# Patient Record
Sex: Male | Born: 1950 | Race: White | Hispanic: No | Marital: Married | Smoking: Former smoker
Health system: Southern US, Community
[De-identification: ages and names within clinical notes are randomized; demographics above are authoritative.]

## PROBLEM LIST (undated history)

## (undated) DIAGNOSIS — J449 Chronic obstructive pulmonary disease, unspecified: Secondary | ICD-10-CM

## (undated) DIAGNOSIS — H9190 Unspecified hearing loss, unspecified ear: Secondary | ICD-10-CM

## (undated) DIAGNOSIS — H269 Unspecified cataract: Secondary | ICD-10-CM

## (undated) DIAGNOSIS — I2510 Atherosclerotic heart disease of native coronary artery without angina pectoris: Secondary | ICD-10-CM

## (undated) DIAGNOSIS — Z7901 Long term (current) use of anticoagulants: Secondary | ICD-10-CM

## (undated) DIAGNOSIS — K219 Gastro-esophageal reflux disease without esophagitis: Secondary | ICD-10-CM

## (undated) DIAGNOSIS — A029 Salmonella infection, unspecified: Secondary | ICD-10-CM

## (undated) DIAGNOSIS — I484 Atypical atrial flutter: Secondary | ICD-10-CM

## (undated) DIAGNOSIS — K529 Noninfective gastroenteritis and colitis, unspecified: Secondary | ICD-10-CM

## (undated) DIAGNOSIS — H6690 Otitis media, unspecified, unspecified ear: Secondary | ICD-10-CM

## (undated) DIAGNOSIS — J329 Chronic sinusitis, unspecified: Secondary | ICD-10-CM

## (undated) DIAGNOSIS — N400 Enlarged prostate without lower urinary tract symptoms: Secondary | ICD-10-CM

## (undated) HISTORY — PX: EYE SURGERY: SHX253

## (undated) HISTORY — DX: Unspecified cataract: H26.9

## (undated) HISTORY — DX: Gastro-esophageal reflux disease without esophagitis: K21.9

## (undated) HISTORY — DX: Benign prostatic hyperplasia without lower urinary tract symptoms: N40.0

## (undated) HISTORY — PX: COLONOSCOPY: SHX174

---

## 2008-06-17 ENCOUNTER — Ambulatory Visit: Payer: Self-pay | Admitting: Diagnostic Radiology

## 2008-06-17 ENCOUNTER — Emergency Department (HOSPITAL_BASED_OUTPATIENT_CLINIC_OR_DEPARTMENT_OTHER): Admission: EM | Admit: 2008-06-17 | Discharge: 2008-06-17 | Payer: Self-pay | Admitting: Emergency Medicine

## 2008-09-17 ENCOUNTER — Ambulatory Visit: Payer: Self-pay | Admitting: Diagnostic Radiology

## 2008-09-17 ENCOUNTER — Emergency Department (HOSPITAL_BASED_OUTPATIENT_CLINIC_OR_DEPARTMENT_OTHER): Admission: EM | Admit: 2008-09-17 | Discharge: 2008-09-17 | Payer: Self-pay | Admitting: Emergency Medicine

## 2010-01-26 ENCOUNTER — Emergency Department (HOSPITAL_BASED_OUTPATIENT_CLINIC_OR_DEPARTMENT_OTHER): Admission: EM | Admit: 2010-01-26 | Discharge: 2010-01-26 | Payer: Self-pay | Admitting: Emergency Medicine

## 2010-05-06 ENCOUNTER — Emergency Department (HOSPITAL_BASED_OUTPATIENT_CLINIC_OR_DEPARTMENT_OTHER)
Admission: EM | Admit: 2010-05-06 | Discharge: 2010-05-06 | Payer: Self-pay | Source: Home / Self Care | Admitting: Emergency Medicine

## 2011-04-20 ENCOUNTER — Emergency Department (HOSPITAL_BASED_OUTPATIENT_CLINIC_OR_DEPARTMENT_OTHER)
Admission: EM | Admit: 2011-04-20 | Discharge: 2011-04-20 | Disposition: A | Discharge status: Home / Self Care | Payer: Self-pay | Attending: Emergency Medicine | Admitting: Emergency Medicine

## 2011-04-20 DIAGNOSIS — H9209 Otalgia, unspecified ear: Secondary | ICD-10-CM | POA: Insufficient documentation

## 2011-04-20 DIAGNOSIS — J329 Chronic sinusitis, unspecified: Secondary | ICD-10-CM | POA: Insufficient documentation

## 2011-04-20 DIAGNOSIS — R51 Headache: Secondary | ICD-10-CM | POA: Insufficient documentation

## 2011-04-20 MED ORDER — AMOXICILLIN 500 MG PO CAPS: 500.0000 mg | ORAL_CAPSULE | Freq: Three times a day (TID) | ORAL | Status: AC

## 2011-04-20 NOTE — ED Notes (Signed)
C/o sinus congestion right ear ache x 4 days

## 2011-04-20 NOTE — ED Provider Notes (Signed)
History     CSN: [id number]  Arrival date & time 04/20/11  1458   First MD Initiated Contact with Patient 04/20/11 1546      Chief Complaint  Patient presents with  . Facial Pain  . Otalgia    (Consider location/radiation/quality/duration/timing/severity/associated sxs/prior treatment) Patient is a 60 y.o. male presenting with ear pain. The history is provided by the patient. No language interpreter was used.  Otalgia This is a new problem. The current episode started more than 2 days ago. There is pain in the right ear. The problem occurs constantly. The problem has not changed since onset.There has been no fever. Associated symptoms include headaches.    History reviewed. No pertinent past medical history.  History reviewed. No pertinent past surgical history.  No family history on file.  History  Substance Use Topics  . Smoking status: Current Everyday Smoker  . Smokeless tobacco: Not on file  . Alcohol Use: No      Review of Systems  Constitutional: Negative.   HENT: Positive for ear pain.   Respiratory: Negative.   Cardiovascular: Negative.   Musculoskeletal: Negative.   Neurological: Positive for headaches.    Allergies  Aspirin  Home Medications   Current Outpatient Rx  Name Route Sig Dispense Refill  . ACETAMINOPHEN 500 MG PO TABS Oral Take 1,000 mg by mouth every 6 (six) hours as needed. For pain     . ADVAIR DISKUS IN Inhalation Inhale 1 puff into the lungs daily as needed. For shortness of breath     . PRESCRIPTION MEDICATION Right Ear Place 1 drop into the right ear once as needed. Previous prescription for ear infection     . PSEUDOEPH-DOXYLAMINE-DM-APAP 60-12.09-27-998 MG/30ML PO LIQD Oral Take 30 mLs by mouth at bedtime as needed. For congestion     . SINUS RELIEF PO Oral Take 2 capsules by mouth once as needed. For congestion     . AMOXICILLIN 500 MG PO CAPS Oral Take 1 capsule (500 mg total) by mouth 3 (three) times daily. 30 capsule 0     BP 133/91  Pulse 80  Temp(Src) 98.7 F (37.1 C) (Oral)  Resp 16  Ht 5\' 8"  (1.727 m)  Wt 180 lb (81.647 kg)  BMI 27.37 kg/m2  SpO2 100%  Physical Exam  Nursing note and vitals reviewed. Constitutional: He appears well-developed and well-nourished.  HENT:  Right Ear: External ear normal.  Left Ear: External ear normal.  Nose: Right sinus exhibits frontal sinus tenderness.  Mouth/Throat: Oropharynx is clear and moist.  Cardiovascular: Normal rate and regular rhythm.   Pulmonary/Chest: Effort normal and breath sounds normal.  Musculoskeletal: Normal range of motion.  Neurological: He is alert.    ED Course  Procedures (including critical care time)  Labs Reviewed - No data to display No results found.   1. Sinusitis       MDM  Will treat for sinusitis        Teressa Lower, NP 04/20/11 2049

## 2011-04-20 NOTE — ED Provider Notes (Signed)
Medical screening examination/treatment/procedure(s) were performed by non-physician practitioner and as supervising physician I was immediately available for consultation/collaboration.  Kinleigh Nault R. Cia Garretson, MD 04/20/11 2319 

## 2012-01-13 ENCOUNTER — Emergency Department (HOSPITAL_BASED_OUTPATIENT_CLINIC_OR_DEPARTMENT_OTHER): Payer: Self-pay

## 2012-01-13 ENCOUNTER — Emergency Department (HOSPITAL_BASED_OUTPATIENT_CLINIC_OR_DEPARTMENT_OTHER)
Admission: EM | Admit: 2012-01-13 | Discharge: 2012-01-13 | Disposition: A | Discharge status: Home / Self Care | Payer: Self-pay | Attending: Emergency Medicine | Admitting: Emergency Medicine

## 2012-01-13 ENCOUNTER — Encounter (HOSPITAL_BASED_OUTPATIENT_CLINIC_OR_DEPARTMENT_OTHER): Payer: Self-pay | Admitting: *Deleted

## 2012-01-13 DIAGNOSIS — K529 Noninfective gastroenteritis and colitis, unspecified: Secondary | ICD-10-CM

## 2012-01-13 DIAGNOSIS — K5289 Other specified noninfective gastroenteritis and colitis: Secondary | ICD-10-CM | POA: Insufficient documentation

## 2012-01-13 DIAGNOSIS — R509 Fever, unspecified: Secondary | ICD-10-CM | POA: Insufficient documentation

## 2012-01-13 DIAGNOSIS — J449 Chronic obstructive pulmonary disease, unspecified: Secondary | ICD-10-CM | POA: Insufficient documentation

## 2012-01-13 DIAGNOSIS — K625 Hemorrhage of anus and rectum: Secondary | ICD-10-CM | POA: Insufficient documentation

## 2012-01-13 DIAGNOSIS — R197 Diarrhea, unspecified: Secondary | ICD-10-CM | POA: Insufficient documentation

## 2012-01-13 DIAGNOSIS — K8020 Calculus of gallbladder without cholecystitis without obstruction: Secondary | ICD-10-CM | POA: Insufficient documentation

## 2012-01-13 DIAGNOSIS — J4489 Other specified chronic obstructive pulmonary disease: Secondary | ICD-10-CM | POA: Insufficient documentation

## 2012-01-13 DIAGNOSIS — N400 Enlarged prostate without lower urinary tract symptoms: Secondary | ICD-10-CM | POA: Insufficient documentation

## 2012-01-13 HISTORY — DX: Chronic obstructive pulmonary disease, unspecified: J44.9

## 2012-01-13 HISTORY — DX: Otitis media, unspecified, unspecified ear: H66.90

## 2012-01-13 HISTORY — DX: Chronic sinusitis, unspecified: J32.9

## 2012-01-13 LAB — COMPREHENSIVE METABOLIC PANEL
ALT: 14 U/L (ref 0–53)
Alkaline Phosphatase: 87 U/L (ref 39–117)
CO2: 24 mEq/L (ref 19–32)
GFR calc Af Amer: >90 mL/min (ref >90)
GFR calc non Af Amer: >90 mL/min (ref >90)
Glucose, Bld: 102 mg/dL — ABNORMAL HIGH (ref 70–99)
Potassium: 3.2 mEq/L — ABNORMAL LOW (ref 3.5–5.1)
Sodium: 133 mEq/L — ABNORMAL LOW (ref 135–145)

## 2012-01-13 LAB — URINALYSIS, ROUTINE W REFLEX MICROSCOPIC
Bilirubin Urine: NEGATIVE
Hgb urine dipstick: NEGATIVE
Protein, ur: NEGATIVE mg/dL
Urobilinogen, UA: 0.2 mg/dL (ref 0.0–1.0)

## 2012-01-13 LAB — CBC WITH DIFFERENTIAL/PLATELET
Eosinophils Relative: 2 % (ref 0–5)
Hemoglobin: 14.8 g/dL (ref 13.0–17.0)
Lymphocytes Relative: 13 % (ref 12–46)
Lymphs Abs: 1.4 10*3/uL (ref 0.7–4.0)
MCV: 84 fL (ref 78.0–100.0)
Neutrophils Relative %: 76 % (ref 43–77)
Platelets: 308 10*3/uL (ref 150–400)
RBC: 5 MIL/uL (ref 4.22–5.81)
WBC: 11.4 10*3/uL — ABNORMAL HIGH (ref 4.0–10.5)

## 2012-01-13 MED ORDER — PROMETHAZINE HCL 25 MG PO TABS: 25.0000 mg | ORAL_TABLET | Freq: Four times a day (QID) | ORAL | Status: DC | PRN

## 2012-01-13 MED ORDER — IOHEXOL 300 MG/ML  SOLN
20.0000 mL | INTRAMUSCULAR | Status: AC
  Administered 2012-01-13 (×2): 20 mL via ORAL

## 2012-01-13 MED ORDER — SODIUM CHLORIDE 0.9 % IV SOLN: INTRAVENOUS | Status: DC

## 2012-01-13 MED ORDER — SODIUM CHLORIDE 0.9 % IV BOLUS (SEPSIS)
1000.0000 mL | Freq: Once | INTRAVENOUS | Status: AC
  Administered 2012-01-13: 1000 mL via INTRAVENOUS

## 2012-01-13 MED ORDER — METRONIDAZOLE 500 MG PO TABS: 500.0000 mg | ORAL_TABLET | Freq: Two times a day (BID) | ORAL | Status: AC

## 2012-01-13 MED ORDER — CIPROFLOXACIN HCL 500 MG PO TABS: 500.0000 mg | ORAL_TABLET | Freq: Two times a day (BID) | ORAL | Status: AC

## 2012-01-13 MED ORDER — POTASSIUM CHLORIDE CRYS ER 20 MEQ PO TBCR
40.0000 meq | EXTENDED_RELEASE_TABLET | Freq: Once | ORAL | Status: AC
  Administered 2012-01-13: 40 meq via ORAL
  Filled 2012-01-13: qty 2

## 2012-01-13 MED ORDER — IOHEXOL 300 MG/ML  SOLN
100.0000 mL | Freq: Once | INTRAMUSCULAR | Status: AC | PRN
  Administered 2012-01-13: 100 mL via INTRAVENOUS

## 2012-01-13 MED ORDER — HYDROCODONE-ACETAMINOPHEN 5-325 MG PO TABS: 2.0000 | ORAL_TABLET | ORAL | Status: AC | PRN

## 2012-01-13 MED ORDER — ONDANSETRON HCL 4 MG/2ML IJ SOLN
4.0000 mg | Freq: Once | INTRAMUSCULAR | Status: AC
  Administered 2012-01-13: 4 mg via INTRAVENOUS
  Filled 2012-01-13: qty 2

## 2012-01-13 NOTE — ED Notes (Addendum)
Pt states he has noticed a little bright red blood in his diarrhea stools x 2 days. Fever last p.m. Vomited x 1 earlier. Also states he recently had a tooth abscess and used baking soda, peroxide and listerine for same.

## 2012-01-13 NOTE — ED Provider Notes (Signed)
History     CSN: [id number]  Arrival date & time 01/13/12  1337   First MD Initiated Contact with Patient 01/13/12 1424      Chief Complaint  Patient presents with  . Rectal Bleeding    (Consider location/radiation/quality/duration/timing/severity/associated sxs/prior treatment) Patient is a 61 y.o. male presenting with abdominal pain. The history is provided by the patient. No language interpreter was used.  Abdominal Pain The primary symptoms of the illness include abdominal pain. The current episode started 2 days ago. The onset of the illness was gradual. The problem has not changed since onset. The illness is associated with eating. The patient states that she believes she is currently not pregnant. Risk factors: none. Symptoms associated with the illness do not include chills. Significant associated medical issues do not include inflammatory bowel disease.    Past Medical History  Diagnosis Date  . COPD (chronic obstructive pulmonary disease)   . Sinus infection   . Ear infection     History reviewed. No pertinent past surgical history.  History reviewed. No pertinent family history.  History  Substance Use Topics  . Smoking status: Current Every Day Smoker  . Smokeless tobacco: Not on file  . Alcohol Use: Yes      Review of Systems  Constitutional: Negative for chills.  Gastrointestinal: Positive for abdominal pain.  All other systems reviewed and are negative.    Allergies  Aspirin  Home Medications   Current Outpatient Rx  Name Route Sig Dispense Refill  . ACETAMINOPHEN 500 MG PO TABS Oral Take 1,000 mg by mouth every 6 (six) hours as needed. For pain    . CASANTHRANOL-DOCUSATE SODIUM 30-100 MG PO CAPS Oral Take 2 capsules by mouth daily as needed. For constipation.    Marland Kitchen ADVAIR DISKUS IN Inhalation Inhale 1 puff into the lungs daily as needed. For shortness of breath      BP 147/76  Pulse 92  Temp 99.3 F (37.4 C) (Oral)  Resp 20  Ht 5\' 3"   (1.6 m)  Wt 145 lb (65.772 kg)  BMI 25.69 kg/m2  SpO2 98%  Physical Exam  Vitals reviewed. Constitutional: He appears well-developed and well-nourished.  HENT:  Head: Normocephalic and atraumatic.  Right Ear: External ear normal.  Left Ear: External ear normal.  Nose: Nose normal.  Mouth/Throat: Oropharynx is clear and moist.  Eyes: Conjunctivae normal and EOM are normal. Pupils are equal, round, and reactive to light.  Neck: Normal range of motion.  Cardiovascular: Normal rate and normal heart sounds.   Pulmonary/Chest: Effort normal and breath sounds normal.  Abdominal: Soft.  Musculoskeletal: Normal range of motion.  Neurological: He is alert.  Skin: Skin is warm.  Psychiatric: He has a normal mood and affect.    ED Course  Procedures (including critical care time)  Labs Reviewed  CBC WITH DIFFERENTIAL - Abnormal; Notable for the following:    WBC 11.4 (*)     Neutro Abs 8.6 (*)     Monocytes Absolute 1.1 (*)     All other components within normal limits  COMPREHENSIVE METABOLIC PANEL - Abnormal; Notable for the following:    Sodium 133 (*)     Potassium 3.2 (*)     Glucose, Bld 102 (*)     All other components within normal limits  URINALYSIS, ROUTINE W REFLEX MICROSCOPIC - Abnormal; Notable for the following:    Color, Urine AMBER (*)  BIOCHEMICALS MAY BE AFFECTED BY COLOR   All other components within normal  limits  LIPASE, BLOOD  OCCULT BLOOD X 1 CARD TO LAB, STOOL   Dg Chest 2 View  01/13/2012  *RADIOLOGY REPORT*  Clinical Data: Bloody diarrhea.  Fever.  Vomiting.  CHEST - 2 VIEW  Comparison: 09/17/2008  Findings: Pleuroparenchymal scarring at the lung apices is stable. Normal heart size.  Bronchitic changes.  Hyperaeration and increased AP diameter of the chest.  Stable thoracic spine.  No pleural effusion.  No pneumothorax.  IMPRESSION: No active cardiopulmonary disease.  Chronic changes are noted.   Original Report Authenticated By: Donavan Burnet, M.D.     Ct Abdomen Pelvis W Contrast  01/13/2012  *RADIOLOGY REPORT*  Clinical Data: Rectal bleeding  CT ABDOMEN AND PELVIS WITH CONTRAST  Technique:  Multidetector CT imaging of the abdomen and pelvis was performed following the standard protocol during bolus administration of intravenous contrast.  Contrast: OMNIPAQUE IOHEXOL 300 MG/ML  SOLN  Comparison: None.  Findings: There is scarring at the left lung base.  No active process.  No pleural or pericardial fluid.  There is an 9 mm cyst in the lateral segment of the left lobe of the liver.  The remainder of the liver parenchyma is normal.  There are several calcified gallstones but no evidence of cholecystitis at this moment.  The spleen is normal.  The pancreas is normal. The adrenal glands are normal.  No renal parenchymal lesions other than a 9 mm cyst at the upper pole of the left kidney and two 9 mm cysts projecting laterally from the mid portion of the right kidney.  Bilaterally prominent renal pelves, probably normal variant.  There is atherosclerosis of the aorta but no aneurysm. The IVC is unremarkable.  No retroperitoneal mass or adenopathy. No free intraperitoneal fluid or air.  There is prostatic enlargement. The bladder is thick-walled and there are multiple bladder diverticulae, the largest projecting posteriorly on the left measuring over 6 cm in diameter.  The small bowel appears unremarkable.  The colon appears to contain a large amount of fluid.  There could be colonic wall thickening in the right colon raising the possibility of colitis.  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  IMPRESSION: Probable colonic wall thickening in the right colon suggesting colitis.  Large amount of fluid throughout the colon.  No small bowel pathology evident.  Gallstones without evidence of cholecystitis or obstruction at this moment.  Atherosclerosis of the aorta.  Degenerative disease of the lumbar spine.  Enlarged prostate.   Multiple bladder diverticulae.   Original Report Authenticated By: Thomasenia Sales, M.D.      1. Diarrhea   2. Rectal bleeding       MDM  . Results for orders placed during the hospital encounter of 01/13/12  CBC WITH DIFFERENTIAL      Component Value Range   WBC 11.4 (*) 4.0 - 10.5 K/uL   RBC 5.00  4.22 - 5.81 MIL/uL   Hemoglobin 14.8  13.0 - 17.0 g/dL   HCT 84.1  32.4 - 40.1 %   MCV 84.0  78.0 - 100.0 fL   MCH 29.6  26.0 - 34.0 pg   MCHC 35.2  30.0 - 36.0 g/dL   RDW 02.7  25.3 - 66.4 %   Platelets 308  150 - 400 K/uL   Neutrophils Relative 76  43 - 77 %   Neutro Abs 8.6 (*) 1.7 - 7.7 K/uL   Lymphocytes Relative 13  12 - 46 %   Lymphs Abs  1.4  0.7 - 4.0 K/uL   Monocytes Relative 9  3 - 12 %   Monocytes Absolute 1.1 (*) 0.1 - 1.0 K/uL   Eosinophils Relative 2  0 - 5 %   Eosinophils Absolute 0.3  0.0 - 0.7 K/uL   Basophils Relative 0  0 - 1 %   Basophils Absolute 0.0  0.0 - 0.1 K/uL  COMPREHENSIVE METABOLIC PANEL      Component Value Range   Sodium 133 (*) 135 - 145 mEq/L   Potassium 3.2 (*) 3.5 - 5.1 mEq/L   Chloride 97  96 - 112 mEq/L   CO2 24  19 - 32 mEq/L   Glucose, Bld 102 (*) 70 - 99 mg/dL   BUN 12  6 - 23 mg/dL   Creatinine, Ser 1.61  0.50 - 1.35 mg/dL   Calcium 9.0  8.4 - 09.6 mg/dL   Total Protein 7.4  6.0 - 8.3 g/dL   Albumin 3.7  3.5 - 5.2 g/dL   AST 15  0 - 37 U/L   ALT 14  0 - 53 U/L   Alkaline Phosphatase 87  39 - 117 U/L   Total Bilirubin 0.4  0.3 - 1.2 mg/dL   GFR calc non Af Amer >90  >90 mL/min   GFR calc Af Amer >90  >90 mL/min  LIPASE, BLOOD      Component Value Range   Lipase 33  11 - 59 U/L  URINALYSIS, ROUTINE W REFLEX MICROSCOPIC      Component Value Range   Color, Urine AMBER (*) YELLOW   APPearance CLEAR  CLEAR   Specific Gravity, Urine 1.023  1.005 - 1.030   pH 5.5  5.0 - 8.0   Glucose, UA NEGATIVE  NEGATIVE mg/dL   Hgb urine dipstick NEGATIVE  NEGATIVE   Bilirubin Urine NEGATIVE  NEGATIVE   Ketones, ur NEGATIVE  NEGATIVE mg/dL    Protein, ur NEGATIVE  NEGATIVE mg/dL   Urobilinogen, UA 0.2  0.0 - 1.0 mg/dL   Nitrite NEGATIVE  NEGATIVE   Leukocytes, UA NEGATIVE  NEGATIVE  OCCULT BLOOD X 1 CARD TO LAB, STOOL      Component Value Range   Fecal Occult Bld NEGATIVE         Ct scan shows colitis,   Pt advised of need for follow up.   Pt given number for primary care and advised of need for follow up.    Lonia Skinner [place], [place] 01/14/12 1212  Lonia Skinner [place], [place] 01/14/12 1213

## 2012-01-13 NOTE — ED Provider Notes (Addendum)
History     CSN: [id number]  Arrival date & time 01/13/12  1337   First MD Initiated Contact with Patient 01/13/12 1424      Chief Complaint  Patient presents with  . Rectal Bleeding    (Consider location/radiation/quality/duration/timing/severity/associated sxs/prior treatment) Patient is a 61 y.o. male presenting with hematochezia. The history is provided by the patient.  Rectal Bleeding  The current episode started yesterday. The problem has been resolved. The pain is mild. Associated symptoms include a fever and diarrhea. Pertinent negatives include no abdominal pain, no hematemesis, no nausea, no rectal pain, no vomiting, no hematuria, no chest pain, no headaches, no coughing, no difficulty breathing and no rash.   Onset of a diarrheal-type illness yesterday had one bowel movement last evening that had the red blood in it that was pink in the tall water. No bleeding since. Diarrhea has persisted though. No vomiting no significant abdominal pain does feel weak all over. No history of similar type illness. Past Medical History  Diagnosis Date  . COPD (chronic obstructive pulmonary disease)   . Sinus infection   . Ear infection     History reviewed. No pertinent past surgical history.  History reviewed. No pertinent family history.  History  Substance Use Topics  . Smoking status: Current Every Day Smoker  . Smokeless tobacco: Not on file  . Alcohol Use: Yes      Review of Systems  Constitutional: Positive for fever and fatigue.  HENT: Negative for neck pain.   Eyes: Negative for visual disturbance.  Respiratory: Negative for cough.   Cardiovascular: Negative for chest pain.  Gastrointestinal: Positive for diarrhea and hematochezia. Negative for nausea, vomiting, abdominal pain, rectal pain and hematemesis.  Genitourinary: Negative for dysuria and hematuria.  Musculoskeletal: Negative for back pain.  Skin: Negative for rash.  Neurological: Negative for headaches.    Hematological: Does not bruise/bleed easily.    Allergies  Aspirin  Home Medications   Current Outpatient Rx  Name Route Sig Dispense Refill  . ACETAMINOPHEN 500 MG PO TABS Oral Take 1,000 mg by mouth every 6 (six) hours as needed. For pain    . CASANTHRANOL-DOCUSATE SODIUM 30-100 MG PO CAPS Oral Take 2 capsules by mouth daily as needed. For constipation.    Marland Kitchen ADVAIR DISKUS IN Inhalation Inhale 1 puff into the lungs daily as needed. For shortness of breath      BP 147/76  Pulse 92  Temp 99.3 F (37.4 C) (Oral)  Resp 20  Ht 5\' 3"  (1.6 m)  Wt 145 lb (65.772 kg)  BMI 25.69 kg/m2  SpO2 98%  Physical Exam  Nursing note and vitals reviewed. Constitutional: He is oriented to person, place, and time. He appears well-developed and well-nourished. No distress.  HENT:  Head: Normocephalic and atraumatic.  Mouth/Throat: Oropharynx is clear and moist.  Eyes: Conjunctivae normal and EOM are normal. Pupils are equal, round, and reactive to light.  Neck: Normal range of motion. Neck supple.  Cardiovascular: Normal rate, regular rhythm and normal heart sounds.   No murmur heard. Pulmonary/Chest: Effort normal and breath sounds normal. He has no wheezes. He has no rales.  Abdominal: Soft. Bowel sounds are normal. There is no tenderness.  Musculoskeletal: Normal range of motion. He exhibits no edema.  Neurological: He is alert and oriented to person, place, and time. No cranial nerve deficit. He exhibits normal muscle tone. Coordination normal.  Skin: Skin is warm. No rash noted.    ED Course  Procedures (including  critical care time)  Labs Reviewed  CBC WITH DIFFERENTIAL - Abnormal; Notable for the following:    WBC 11.4 (*)     Neutro Abs 8.6 (*)     Monocytes Absolute 1.1 (*)     All other components within normal limits  COMPREHENSIVE METABOLIC PANEL - Abnormal; Notable for the following:    Sodium 133 (*)     Potassium 3.2 (*)     Glucose, Bld 102 (*)     All other  components within normal limits  LIPASE, BLOOD  OCCULT BLOOD X 1 CARD TO LAB, STOOL  URINALYSIS, ROUTINE W REFLEX MICROSCOPIC   Dg Chest 2 View  01/13/2012  *RADIOLOGY REPORT*  Clinical Data: Bloody diarrhea.  Fever.  Vomiting.  CHEST - 2 VIEW  Comparison: 09/17/2008  Findings: Pleuroparenchymal scarring at the lung apices is stable. Normal heart size.  Bronchitic changes.  Hyperaeration and increased AP diameter of the chest.  Stable thoracic spine.  No pleural effusion.  No pneumothorax.  IMPRESSION: No active cardiopulmonary disease.  Chronic changes are noted.   Original Report Authenticated By: Donavan Burnet, M.D.    Results for orders placed during the hospital encounter of 01/13/12  CBC WITH DIFFERENTIAL      Component Value Range   WBC 11.4 (*) 4.0 - 10.5 K/uL   RBC 5.00  4.22 - 5.81 MIL/uL   Hemoglobin 14.8  13.0 - 17.0 g/dL   HCT 16.1  09.6 - 04.5 %   MCV 84.0  78.0 - 100.0 fL   MCH 29.6  26.0 - 34.0 pg   MCHC 35.2  30.0 - 36.0 g/dL   RDW 40.9  81.1 - 91.4 %   Platelets 308  150 - 400 K/uL   Neutrophils Relative 76  43 - 77 %   Neutro Abs 8.6 (*) 1.7 - 7.7 K/uL   Lymphocytes Relative 13  12 - 46 %   Lymphs Abs 1.4  0.7 - 4.0 K/uL   Monocytes Relative 9  3 - 12 %   Monocytes Absolute 1.1 (*) 0.1 - 1.0 K/uL   Eosinophils Relative 2  0 - 5 %   Eosinophils Absolute 0.3  0.0 - 0.7 K/uL   Basophils Relative 0  0 - 1 %   Basophils Absolute 0.0  0.0 - 0.1 K/uL  COMPREHENSIVE METABOLIC PANEL      Component Value Range   Sodium 133 (*) 135 - 145 mEq/L   Potassium 3.2 (*) 3.5 - 5.1 mEq/L   Chloride 97  96 - 112 mEq/L   CO2 24  19 - 32 mEq/L   Glucose, Bld 102 (*) 70 - 99 mg/dL   BUN 12  6 - 23 mg/dL   Creatinine, Ser 7.82  0.50 - 1.35 mg/dL   Calcium 9.0  8.4 - 95.6 mg/dL   Total Protein 7.4  6.0 - 8.3 g/dL   Albumin 3.7  3.5 - 5.2 g/dL   AST 15  0 - 37 U/L   ALT 14  0 - 53 U/L   Alkaline Phosphatase 87  39 - 117 U/L   Total Bilirubin 0.4  0.3 - 1.2 mg/dL   GFR calc non  Af Amer >90  >90 mL/min   GFR calc Af Amer >90  >90 mL/min  LIPASE, BLOOD      Component Value Range   Lipase 33  11 - 59 U/L  OCCULT BLOOD X 1 CARD TO LAB, STOOL      Component Value  Range   Fecal Occult Bld NEGATIVE       1. Diarrhea   2. Rectal bleeding       MDM  Patient with acute onset of a diarrheal type illness last evening did have one bowel movement with blood and has not had any since. Labs are still pending but appears to be no leukocytosis. Vital signs are normal no significant tachycardia or hypotension. The results of the CT will be significant in determining patient's disposition.  CBC shows no leukocytosis no significant anemia.        Shelda Jakes, MD 01/13/12 1501  Addendum:  Results for orders placed during the hospital encounter of 01/13/12  CBC WITH DIFFERENTIAL      Component Value Range   WBC 11.4 (*) 4.0 - 10.5 K/uL   RBC 5.00  4.22 - 5.81 MIL/uL   Hemoglobin 14.8  13.0 - 17.0 g/dL   HCT 30.8  65.7 - 84.6 %   MCV 84.0  78.0 - 100.0 fL   MCH 29.6  26.0 - 34.0 pg   MCHC 35.2  30.0 - 36.0 g/dL   RDW 96.2  95.2 - 84.1 %   Platelets 308  150 - 400 K/uL   Neutrophils Relative 76  43 - 77 %   Neutro Abs 8.6 (*) 1.7 - 7.7 K/uL   Lymphocytes Relative 13  12 - 46 %   Lymphs Abs 1.4  0.7 - 4.0 K/uL   Monocytes Relative 9  3 - 12 %   Monocytes Absolute 1.1 (*) 0.1 - 1.0 K/uL   Eosinophils Relative 2  0 - 5 %   Eosinophils Absolute 0.3  0.0 - 0.7 K/uL   Basophils Relative 0  0 - 1 %   Basophils Absolute 0.0  0.0 - 0.1 K/uL  COMPREHENSIVE METABOLIC PANEL      Component Value Range   Sodium 133 (*) 135 - 145 mEq/L   Potassium 3.2 (*) 3.5 - 5.1 mEq/L   Chloride 97  96 - 112 mEq/L   CO2 24  19 - 32 mEq/L   Glucose, Bld 102 (*) 70 - 99 mg/dL   BUN 12  6 - 23 mg/dL   Creatinine, Ser 3.24  0.50 - 1.35 mg/dL   Calcium 9.0  8.4 - 40.1 mg/dL   Total Protein 7.4  6.0 - 8.3 g/dL   Albumin 3.7  3.5 - 5.2 g/dL   AST 15  0 - 37 U/L   ALT 14  0 -  53 U/L   Alkaline Phosphatase 87  39 - 117 U/L   Total Bilirubin 0.4  0.3 - 1.2 mg/dL   GFR calc non Af Amer >90  >90 mL/min   GFR calc Af Amer >90  >90 mL/min  LIPASE, BLOOD      Component Value Range   Lipase 33  11 - 59 U/L  URINALYSIS, ROUTINE W REFLEX MICROSCOPIC      Component Value Range   Color, Urine AMBER (*) YELLOW   APPearance CLEAR  CLEAR   Specific Gravity, Urine 1.023  1.005 - 1.030   pH 5.5  5.0 - 8.0   Glucose, UA NEGATIVE  NEGATIVE mg/dL   Hgb urine dipstick NEGATIVE  NEGATIVE   Bilirubin Urine NEGATIVE  NEGATIVE   Ketones, ur NEGATIVE  NEGATIVE mg/dL   Protein, ur NEGATIVE  NEGATIVE mg/dL   Urobilinogen, UA 0.2  0.0 - 1.0 mg/dL   Nitrite NEGATIVE  NEGATIVE   Leukocytes, UA NEGATIVE  NEGATIVE  OCCULT BLOOD X 1 CARD TO LAB, STOOL      Component Value Range   Fecal Occult Bld NEGATIVE     Dg Chest 2 View  01/13/2012  *RADIOLOGY REPORT*  Clinical Data: Bloody diarrhea.  Fever.  Vomiting.  CHEST - 2 VIEW  Comparison: 09/17/2008  Findings: Pleuroparenchymal scarring at the lung apices is stable. Normal heart size.  Bronchitic changes.  Hyperaeration and increased AP diameter of the chest.  Stable thoracic spine.  No pleural effusion.  No pneumothorax.  IMPRESSION: No active cardiopulmonary disease.  Chronic changes are noted.   Original Report Authenticated By: Donavan Burnet, M.D.    Ct Abdomen Pelvis W Contrast  01/13/2012  *RADIOLOGY REPORT*  Clinical Data: Rectal bleeding  CT ABDOMEN AND PELVIS WITH CONTRAST  Technique:  Multidetector CT imaging of the abdomen and pelvis was performed following the standard protocol during bolus administration of intravenous contrast.  Contrast: OMNIPAQUE IOHEXOL 300 MG/ML  SOLN  Comparison: None.  Findings: There is scarring at the left lung base.  No active process.  No pleural or pericardial fluid.  There is an 9 mm cyst in the lateral segment of the left lobe of the liver.  The remainder of the liver parenchyma is normal.   There are several calcified gallstones but no evidence of cholecystitis at this moment.  The spleen is normal.  The pancreas is normal. The adrenal glands are normal.  No renal parenchymal lesions other than a 9 mm cyst at the upper pole of the left kidney and two 9 mm cysts projecting laterally from the mid portion of the right kidney.  Bilaterally prominent renal pelves, probably normal variant.  There is atherosclerosis of the aorta but no aneurysm. The IVC is unremarkable.  No retroperitoneal mass or adenopathy. No free intraperitoneal fluid or air.  There is prostatic enlargement. The bladder is thick-walled and there are multiple bladder diverticulae, the largest projecting posteriorly on the left measuring over 6 cm in diameter.  The small bowel appears unremarkable.  The colon appears to contain a large amount of fluid.  There could be colonic wall thickening in the right colon raising the possibility of colitis.  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  IMPRESSION: Probable colonic wall thickening in the right colon suggesting colitis.  Large amount of fluid throughout the colon.  No small bowel pathology evident.  Gallstones without evidence of cholecystitis or obstruction at this moment.  Atherosclerosis of the aorta.  Degenerative disease of the lumbar spine.  Enlarged prostate.  Multiple bladder diverticulae.   Original Report Authenticated By: Thomasenia Sales, M.D.      CT cw colitis as suspected.     Shelda Jakes, MD 01/13/12 [phone]

## 2012-01-15 NOTE — ED Provider Notes (Signed)
Medical screening examination/treatment/procedure(s) were conducted as a shared visit with non-physician practitioner(s) and myself.  I personally evaluated the patient during the encounter  Shelda Jakes, MD 01/15/12 [phone]

## 2012-02-01 ENCOUNTER — Emergency Department (HOSPITAL_BASED_OUTPATIENT_CLINIC_OR_DEPARTMENT_OTHER)
Admission: EM | Admit: 2012-02-01 | Discharge: 2012-02-01 | Disposition: A | Discharge status: Home / Self Care | Payer: Self-pay | Attending: Emergency Medicine | Admitting: Emergency Medicine

## 2012-02-01 ENCOUNTER — Encounter (HOSPITAL_BASED_OUTPATIENT_CLINIC_OR_DEPARTMENT_OTHER): Payer: Self-pay | Admitting: *Deleted

## 2012-02-01 DIAGNOSIS — K649 Unspecified hemorrhoids: Secondary | ICD-10-CM | POA: Insufficient documentation

## 2012-02-01 DIAGNOSIS — F17200 Nicotine dependence, unspecified, uncomplicated: Secondary | ICD-10-CM | POA: Insufficient documentation

## 2012-02-01 DIAGNOSIS — Z888 Allergy status to other drugs, medicaments and biological substances status: Secondary | ICD-10-CM | POA: Insufficient documentation

## 2012-02-01 DIAGNOSIS — K625 Hemorrhage of anus and rectum: Secondary | ICD-10-CM | POA: Insufficient documentation

## 2012-02-01 HISTORY — DX: Noninfective gastroenteritis and colitis, unspecified: K52.9

## 2012-02-01 LAB — COMPREHENSIVE METABOLIC PANEL
ALT: 15 U/L (ref 0–53)
CO2: 24 mEq/L (ref 19–32)
Calcium: 9.3 mg/dL (ref 8.4–10.5)
Creatinine, Ser: 0.7 mg/dL (ref 0.50–1.35)
GFR calc Af Amer: >90 mL/min (ref >90)
GFR calc non Af Amer: >90 mL/min (ref >90)
Glucose, Bld: 96 mg/dL (ref 70–99)
Sodium: 136 mEq/L (ref 135–145)

## 2012-02-01 LAB — CBC WITH DIFFERENTIAL/PLATELET
Basophils Absolute: 0 10*3/uL (ref 0.0–0.1)
Basophils Relative: 0 % (ref 0–1)
Eosinophils Absolute: 0.2 10*3/uL (ref 0.0–0.7)
Eosinophils Relative: 3 % (ref 0–5)
HCT: 41.2 % (ref 39.0–52.0)
MCH: 29.4 pg (ref 26.0–34.0)
MCHC: 34.5 g/dL (ref 30.0–36.0)
Monocytes Absolute: 0.8 10*3/uL (ref 0.1–1.0)
Neutro Abs: 5.5 10*3/uL (ref 1.7–7.7)
RDW: 13.3 % (ref 11.5–15.5)

## 2012-02-01 LAB — PROTIME-INR: INR: 1.07 (ref 0.00–1.49)

## 2012-02-01 MED ORDER — SODIUM CHLORIDE 0.9 % IV SOLN
1000.0000 mL | INTRAVENOUS | Status: DC
  Administered 2012-02-01: 1000 mL via INTRAVENOUS

## 2012-02-01 MED ORDER — DOCUSATE SODIUM 100 MG PO CAPS: 100.0000 mg | ORAL_CAPSULE | Freq: Two times a day (BID) | ORAL | Status: DC

## 2012-02-01 MED ORDER — PRAMOXINE HCL 1 % RE FOAM: RECTAL | Status: DC | PRN

## 2012-02-01 NOTE — ED Provider Notes (Signed)
History     CSN: [id number]  Arrival date & time 02/01/12  1314   First MD Initiated Contact with Patient 02/01/12 1326      Chief Complaint  Patient presents with  . Rectal Bleeding    (Consider location/radiation/quality/duration/timing/severity/associated sxs/prior treatment) HPI Comments: Pt was seen in the ED on 9/14.  He was diagnosed with colitis.  At that time he was having rectal bleeding.  Pt had been getting better but he noticed it start up a few days ago.  He was referred to GI but has not made an appointment yet.  Patient is a 61 y.o. male presenting with hematochezia. The history is provided by the patient.  Rectal Bleeding  The current episode started 3 to 5 days ago. The problem occurs frequently. The pain is mild. The stool is described as soft and streaked with blood. Pertinent negatives include no anorexia, no fever, no abdominal pain, no nausea and no vomiting.  He has noticed that he started to get constipated again.  Hi  Past Medical History  Diagnosis Date  . COPD (chronic obstructive pulmonary disease)   . Sinus infection   . Ear infection   . Colitis     History reviewed. No pertinent past surgical history.  History reviewed. No pertinent family history.  History  Substance Use Topics  . Smoking status: Current Every Day Smoker -- 1.0 packs/day  . Smokeless tobacco: Not on file  . Alcohol Use: Yes      Review of Systems  Constitutional: Negative for fever.  Gastrointestinal: Positive for hematochezia. Negative for nausea, vomiting, abdominal pain and anorexia.  All other systems reviewed and are negative.    Allergies  Aspirin  Home Medications   Current Outpatient Rx  Name Route Sig Dispense Refill  . ACETAMINOPHEN 500 MG PO TABS Oral Take 1,000 mg by mouth every 6 (six) hours as needed. For pain    . CASANTHRANOL-DOCUSATE SODIUM 30-100 MG PO CAPS Oral Take 2 capsules by mouth daily as needed. For constipation.    Marland Kitchen ADVAIR DISKUS  IN Inhalation Inhale 1 puff into the lungs daily as needed. For shortness of breath    . PROMETHAZINE HCL 25 MG PO TABS Oral Take 1 tablet (25 mg total) by mouth every 6 (six) hours as needed for nausea. 20 tablet 0    BP 158/87  Pulse 91  Temp 98.1 F (36.7 C)  Resp 18  Ht 5\' 7"  (1.702 m)  Wt 165 lb (74.844 kg)  BMI 25.84 kg/m2  SpO2 100%  Physical Exam  Nursing note and vitals reviewed. Constitutional: He appears well-developed and well-nourished. No distress.  HENT:  Head: Normocephalic and atraumatic.  Right Ear: External ear normal.  Left Ear: External ear normal.  Eyes: Conjunctivae normal are normal. Right eye exhibits no discharge. Left eye exhibits no discharge. No scleral icterus.  Neck: Neck supple. No tracheal deviation present.  Cardiovascular: Normal rate, regular rhythm and intact distal pulses.   Pulmonary/Chest: Effort normal and breath sounds normal. No stridor. No respiratory distress. He has no wheezes. He has no rales.  Abdominal: Soft. Bowel sounds are normal. He exhibits no distension. There is no tenderness. There is no rebound and no guarding.  Genitourinary: Rectal exam shows internal hemorrhoid. Rectal exam shows no fissure, no mass, no tenderness and anal tone normal. Guaiac positive stool.       Small amount of blood streaking noted on glove during exam, no melena   Musculoskeletal: He exhibits no  edema and no tenderness.  Neurological: He is alert. He has normal strength. No sensory deficit. Cranial nerve deficit:  no gross defecits noted. He exhibits normal muscle tone. He displays no seizure activity. Coordination normal.  Skin: Skin is warm and dry. No rash noted.  Psychiatric: He has a normal mood and affect.    ED Course  Procedures (including critical care time) CT scan 9/14 IMPRESSION:  Probable colonic wall thickening in the right colon suggesting  colitis. Large amount of fluid throughout the colon. No small  bowel pathology evident.    Gallstones without evidence of cholecystitis or obstruction at this  moment.  Atherosclerosis of the aorta.  Degenerative disease of the lumbar spine.  Enlarged prostate. Multiple bladder diverticulae.  Original Report Authenticated By: Thomasenia Sales, M.D.    Labs Reviewed  COMPREHENSIVE METABOLIC PANEL  APTT  PROTIME-INR  CBC WITH DIFFERENTIAL  OCCULT BLOOD X 1 CARD TO LAB, STOOL   No results found.   1. Rectal bleeding   2. Hemorrhoids       MDM  Pt with reassuring evaluation.  No active bleeding noted on exam although guaic positive. No abdominal ttp, doubt diverticulitis.  External hemorrhoid noted and suspect internal by exam.  Doubt life threatening GI bleed at this time. Discussed importance of follow up with GI for colonoscopy/sigmoidoscopy.  Pt understands to return to the ED for recurrent/worsening symptoms.        Celene Kras, MD 02/07/12 [phone]

## 2012-02-01 NOTE — ED Notes (Signed)
Pt seen here 9/14 for same, rectal bleeding, which improved but started back x 3 days ago . Did not f/u with gi

## 2012-02-01 NOTE — ED Notes (Signed)
Discharge instructions reviewed. Pt verbalized understanding.  

## 2012-02-09 ENCOUNTER — Telehealth (HOSPITAL_BASED_OUTPATIENT_CLINIC_OR_DEPARTMENT_OTHER): Payer: Self-pay | Admitting: *Deleted

## 2012-02-09 ENCOUNTER — Emergency Department (HOSPITAL_BASED_OUTPATIENT_CLINIC_OR_DEPARTMENT_OTHER)
Admission: EM | Admit: 2012-02-09 | Discharge: 2012-02-09 | Disposition: A | Discharge status: Home / Self Care | Payer: Self-pay | Attending: Emergency Medicine | Admitting: Emergency Medicine

## 2012-02-09 ENCOUNTER — Encounter (HOSPITAL_BASED_OUTPATIENT_CLINIC_OR_DEPARTMENT_OTHER): Payer: Self-pay | Admitting: *Deleted

## 2012-02-09 DIAGNOSIS — J4489 Other specified chronic obstructive pulmonary disease: Secondary | ICD-10-CM | POA: Insufficient documentation

## 2012-02-09 DIAGNOSIS — E876 Hypokalemia: Secondary | ICD-10-CM | POA: Insufficient documentation

## 2012-02-09 DIAGNOSIS — N390 Urinary tract infection, site not specified: Secondary | ICD-10-CM | POA: Insufficient documentation

## 2012-02-09 DIAGNOSIS — R109 Unspecified abdominal pain: Secondary | ICD-10-CM

## 2012-02-09 DIAGNOSIS — R1033 Periumbilical pain: Secondary | ICD-10-CM | POA: Insufficient documentation

## 2012-02-09 DIAGNOSIS — J449 Chronic obstructive pulmonary disease, unspecified: Secondary | ICD-10-CM | POA: Insufficient documentation

## 2012-02-09 LAB — COMPREHENSIVE METABOLIC PANEL
Alkaline Phosphatase: 74 U/L (ref 39–117)
BUN: 12 mg/dL (ref 6–23)
Chloride: 101 mEq/L (ref 96–112)
GFR calc Af Amer: >90 mL/min (ref >90)
Glucose, Bld: 98 mg/dL (ref 70–99)
Potassium: 3.1 mEq/L — ABNORMAL LOW (ref 3.5–5.1)
Total Bilirubin: 0.2 mg/dL — ABNORMAL LOW (ref 0.3–1.2)

## 2012-02-09 LAB — CBC WITH DIFFERENTIAL/PLATELET
Hemoglobin: 13 g/dL (ref 13.0–17.0)
Lymphocytes Relative: 14 % (ref 12–46)
Lymphs Abs: 1.8 10*3/uL (ref 0.7–4.0)
Monocytes Relative: 12 % (ref 3–12)
Neutro Abs: 9.3 10*3/uL — ABNORMAL HIGH (ref 1.7–7.7)
Neutrophils Relative %: 71 % (ref 43–77)
RBC: 4.39 MIL/uL (ref 4.22–5.81)

## 2012-02-09 LAB — URINE MICROSCOPIC-ADD ON

## 2012-02-09 LAB — URINALYSIS, ROUTINE W REFLEX MICROSCOPIC
Glucose, UA: NEGATIVE mg/dL
Ketones, ur: NEGATIVE mg/dL
Protein, ur: NEGATIVE mg/dL

## 2012-02-09 MED ORDER — CIPROFLOXACIN HCL 250 MG PO TABS: 250.0000 mg | ORAL_TABLET | Freq: Two times a day (BID) | ORAL | Status: DC

## 2012-02-09 MED ORDER — SODIUM CHLORIDE 0.9 % IV BOLUS (SEPSIS)
1000.0000 mL | Freq: Once | INTRAVENOUS | Status: AC
  Administered 2012-02-09: 1000 mL via INTRAVENOUS

## 2012-02-09 MED ORDER — METRONIDAZOLE 500 MG PO TABS: 500.0000 mg | ORAL_TABLET | Freq: Three times a day (TID) | ORAL | Status: DC

## 2012-02-09 MED ORDER — HYDROMORPHONE HCL PF 1 MG/ML IJ SOLN
1.0000 mg | Freq: Once | INTRAMUSCULAR | Status: AC
  Administered 2012-02-09: 1 mg via INTRAVENOUS
  Filled 2012-02-09: qty 1

## 2012-02-09 MED ORDER — POTASSIUM CHLORIDE CRYS ER 20 MEQ PO TBCR
40.0000 meq | EXTENDED_RELEASE_TABLET | Freq: Once | ORAL | Status: AC
  Administered 2012-02-09: 40 meq via ORAL
  Filled 2012-02-09: qty 2

## 2012-02-09 MED ORDER — OXYCODONE-ACETAMINOPHEN 5-325 MG PO TABS: 2.0000 | ORAL_TABLET | ORAL | Status: DC | PRN

## 2012-02-09 NOTE — ED Notes (Signed)
Physician Assistant at bedside.

## 2012-02-09 NOTE — ED Provider Notes (Signed)
History     CSN: [id number]  Arrival date & time 02/09/12  1321   First MD Initiated Contact with Patient 02/09/12 1338      Chief Complaint  Patient presents with  . Diarrhea    (Consider location/radiation/quality/duration/timing/severity/associated sxs/prior treatment) HPI Comments: Patient with a history of colitis presents with abdominal pain for the past 3 weeks. The pain started gradually and feels "crampy." The pain is located periumbilically and radiates "all over" his abdomen. He reports associated constipation and diarrhea, general malaise, weakness, as well as blood streaked stools and blood on the tissue when wiping. He denies alleviating/aggravating factors. He has tried prune juice for the constipation which makes him have diarrhea. He has a follow up with a GI specialist this month. He denies fever, nausea, vomiting, SOB, chest pain, dysuria, hematuria.  Patient is a 61 y.o. male presenting with diarrhea.  Diarrhea The primary symptoms include abdominal pain and diarrhea.  The illness is also significant for constipation.    Past Medical History  Diagnosis Date  . COPD (chronic obstructive pulmonary disease)   . Sinus infection   . Ear infection   . Colitis     History reviewed. No pertinent past surgical history.  No family history on file.  History  Substance Use Topics  . Smoking status: Current Every Day Smoker -- 1.0 packs/day  . Smokeless tobacco: Not on file  . Alcohol Use: Yes      Review of Systems  Gastrointestinal: Positive for abdominal pain, diarrhea, constipation, blood in stool and rectal pain.  All other systems reviewed and are negative.    Allergies  Aspirin  Home Medications   Current Outpatient Rx  Name Route Sig Dispense Refill  . ACETAMINOPHEN 500 MG PO TABS Oral Take 1,000 mg by mouth every 6 (six) hours as needed. For pain    . CASANTHRANOL-DOCUSATE SODIUM 30-100 MG PO CAPS Oral Take 2 capsules by mouth daily as  needed. For constipation.    Marland Kitchen DOCUSATE SODIUM 100 MG PO CAPS Oral Take 1 capsule (100 mg total) by mouth every 12 (twelve) hours. 60 capsule 0  . ADVAIR DISKUS IN Inhalation Inhale 1 puff into the lungs daily as needed. For shortness of breath    . PRAMOXINE HCL 1 % RE FOAM Rectal Place rectally every 2 (two) hours as needed for hemorrhoids. 15 g 0  . PROMETHAZINE HCL 25 MG PO TABS Oral Take 1 tablet (25 mg total) by mouth every 6 (six) hours as needed for nausea. 20 tablet 0    BP 131/78  Pulse 85  Temp 97.8 F (36.6 C) (Oral)  Resp 20  SpO2 99%  Physical Exam  Nursing note and vitals reviewed. Constitutional: He is oriented to person, place, and time. He appears well-developed and well-nourished. No distress.  HENT:  Head: Normocephalic and atraumatic.  Eyes: Conjunctivae normal are normal. No scleral icterus.  Neck: Normal range of motion. Neck supple.  Cardiovascular: Normal rate and regular rhythm.  Exam reveals no gallop and no friction rub.   No murmur heard. Pulmonary/Chest: Effort normal and breath sounds normal. He has no wheezes. He has no rales. He exhibits no tenderness.  Abdominal: Soft. There is tenderness.       Generalized tenderness to palpation.   Genitourinary: Rectum normal.       No blood or stool noted on rectal exam. Patient endorsed pain during rectal exam. No internal hemorrhoids felt.   Musculoskeletal: Normal range of motion.  Neurological: He  is alert and oriented to person, place, and time. Coordination normal.       Speech is goal-oriented. Moves limbs without ataxia.   Skin: Skin is warm and dry. He is not diaphoretic.  Psychiatric: He has a normal mood and affect. His behavior is normal.    ED Course  Procedures (including critical care time)  Labs Reviewed  URINALYSIS, ROUTINE W REFLEX MICROSCOPIC - Abnormal; Notable for the following:    Color, Urine AMBER (*)  BIOCHEMICALS MAY BE AFFECTED BY COLOR   APPearance CLOUDY (*)     Hgb urine  dipstick MODERATE (*)     Bilirubin Urine SMALL (*)     Leukocytes, UA TRACE (*)     All other components within normal limits  CBC WITH DIFFERENTIAL - Abnormal; Notable for the following:    WBC 13.0 (*)     HCT 36.8 (*)     Neutro Abs 9.3 (*)     Monocytes Absolute 1.5 (*)     All other components within normal limits  COMPREHENSIVE METABOLIC PANEL - Abnormal; Notable for the following:    Potassium 3.1 (*)     Calcium 8.0 (*)     Total Protein 5.7 (*)     Albumin 2.8 (*)     Total Bilirubin 0.2 (*)     All other components within normal limits  URINE MICROSCOPIC-ADD ON - Abnormal; Notable for the following:    Bacteria, UA MANY (*)     All other components within normal limits  STOOL CULTURE  CLOSTRIDIUM DIFFICILE BY PCR   No results found.   1. Abdominal pain   2. UTI (urinary tract infection)   3. Hypokalemia       MDM  2:03 PM Patient with a history of colitis presents with abdominal pain. Labs pending. No blood or stool noted on rectal exam. Will give fluids and pain meds.   4:19 PM Patient feeling better after fluids, pain medication, and potassium. Patient given potassium due to hypokalemia noted on CMP. Patient's urinalysis shows UTI. I will treat him for UTI and discharge him with pain medication for abdominal pain. Patient will see GI specialist in 5 days, as he has an appointment scheduled. No further evaluation needed at this time.      Emilia Beck, PA-C 02/09/12 2212

## 2012-02-09 NOTE — ED Notes (Signed)
Urinal provided.

## 2012-02-09 NOTE — ED Notes (Signed)
Lab results reviewed with Dr. Fredderick Phenix and Mckinley Jewel. PAC.  Received prescription for metronidazole 500mg  po , 1 tab tid dispense 42 tabs, no refills.   Call placed to patient to discuss diagnosis and prescription called to [facility] [address] [place], [place].  Instructions reviewed with patient who verbalized understanding.

## 2012-02-09 NOTE — ED Notes (Signed)
Diarrhea x 3 weeks. Was diagnosed with colitis here in the past.

## 2012-02-09 NOTE — ED Notes (Signed)
Report received from Terri Masten, RN, care assumed. 

## 2012-02-10 NOTE — ED Provider Notes (Signed)
Medical screening examination/treatment/procedure(s) were performed by non-physician practitioner and as supervising physician I was immediately available for consultation/collaboration.   Lorenzo Pereyra B. Raetta Agostinelli, MD 02/10/12 0843 

## 2012-02-15 NOTE — ED Notes (Signed)
Patient informed of positive results  

## 2012-02-18 ENCOUNTER — Emergency Department (HOSPITAL_BASED_OUTPATIENT_CLINIC_OR_DEPARTMENT_OTHER)
Admission: EM | Admit: 2012-02-18 | Discharge: 2012-02-18 | Discharge status: Against Medical Advice | Payer: Self-pay | Attending: Emergency Medicine | Admitting: Emergency Medicine

## 2012-02-18 ENCOUNTER — Encounter (HOSPITAL_BASED_OUTPATIENT_CLINIC_OR_DEPARTMENT_OTHER): Payer: Self-pay | Admitting: *Deleted

## 2012-02-18 DIAGNOSIS — D649 Anemia, unspecified: Secondary | ICD-10-CM | POA: Insufficient documentation

## 2012-02-18 DIAGNOSIS — R109 Unspecified abdominal pain: Secondary | ICD-10-CM | POA: Insufficient documentation

## 2012-02-18 DIAGNOSIS — R300 Dysuria: Secondary | ICD-10-CM | POA: Insufficient documentation

## 2012-02-18 HISTORY — DX: Salmonella infection, unspecified: A02.9

## 2012-02-18 LAB — CBC WITH DIFFERENTIAL/PLATELET
Hemoglobin: 8.8 g/dL — ABNORMAL LOW (ref 13.0–17.0)
Lymphocytes Relative: 19 % (ref 12–46)
Lymphs Abs: 2 10*3/uL (ref 0.7–4.0)
MCH: 29.3 pg (ref 26.0–34.0)
Monocytes Relative: 6 % (ref 3–12)
Neutro Abs: 7.6 10*3/uL (ref 1.7–7.7)
Neutrophils Relative %: 73 % (ref 43–77)
RBC: 3 MIL/uL — ABNORMAL LOW (ref 4.22–5.81)
WBC: 10.5 10*3/uL (ref 4.0–10.5)

## 2012-02-18 LAB — URINALYSIS, ROUTINE W REFLEX MICROSCOPIC
Hgb urine dipstick: NEGATIVE
Leukocytes, UA: NEGATIVE
Specific Gravity, Urine: 1.009 (ref 1.005–1.030)
Urobilinogen, UA: 0.2 mg/dL (ref 0.0–1.0)

## 2012-02-18 LAB — BASIC METABOLIC PANEL
GFR calc Af Amer: >90 mL/min (ref >90)
GFR calc non Af Amer: >90 mL/min (ref >90)
Glucose, Bld: 102 mg/dL — ABNORMAL HIGH (ref 70–99)
Potassium: 4.1 mEq/L (ref 3.5–5.1)
Sodium: 137 mEq/L (ref 135–145)

## 2012-02-18 MED ORDER — HYDROMORPHONE HCL PF 1 MG/ML IJ SOLN
0.5000 mg | Freq: Once | INTRAMUSCULAR | Status: AC
  Administered 2012-02-18: 0.5 mg via INTRAVENOUS
  Filled 2012-02-18: qty 1

## 2012-02-18 MED ORDER — SODIUM CHLORIDE 0.9 % IV SOLN
Freq: Once | INTRAVENOUS | Status: AC
  Administered 2012-02-18: 15:00:00 via INTRAVENOUS

## 2012-02-18 NOTE — ED Provider Notes (Addendum)
History     CSN: [id number]  Arrival date & time 02/18/12  1349   First MD Initiated Contact with Patient 02/18/12 1404      Chief Complaint  Patient presents with  . Groin Pain    (Consider location/radiation/quality/duration/timing/severity/associated sxs/prior treatment) Patient is a 61 y.o. male presenting with groin pain and dysuria. The history is provided by the patient.  Groin Pain This is a new problem. The current episode started in the past 7 days. The problem has been gradually worsening. Associated symptoms include abdominal pain. Pertinent negatives include no chest pain, chills, coughing, fever, nausea or vomiting. Associated symptoms comments: He complains of LLQ abdominal pain for 4 days, worsening over time. No N, V. He had diarrhea up til last week after being diagnosed with C. Diff that improved with antibiotics. No fever. He also complains of persistent hemorrhoids that only occasionally bleed a small amount. .  Dysuria  This is a new problem. The current episode started 3 to 5 hours ago. The problem occurs every urination. The problem has been gradually worsening. The patient is experiencing no pain. There has been no fever. Pertinent negatives include no chills, no nausea, no vomiting and no hematuria. Associated symptoms comments: He reports difficulty starting a stream of urine but then feels he empties his bladder. No hematuria..    Past Medical History  Diagnosis Date  . COPD (chronic obstructive pulmonary disease)   . Sinus infection   . Ear infection   . Colitis   . Salmonella     History reviewed. No pertinent past surgical history.  History reviewed. No pertinent family history.  History  Substance Use Topics  . Smoking status: Current Every Day Smoker -- 1.0 packs/day  . Smokeless tobacco: Not on file  . Alcohol Use: Yes      Review of Systems  Constitutional: Negative for fever and chills.  HENT: Negative.   Respiratory: Negative.   Negative for cough and shortness of breath.   Cardiovascular: Negative.  Negative for chest pain.  Gastrointestinal: Positive for abdominal pain. Negative for nausea, vomiting and diarrhea.  Genitourinary: Positive for difficulty urinating. Negative for hematuria.  Musculoskeletal: Negative.   Skin: Negative.   Neurological: Negative.  Negative for dizziness.  Psychiatric/Behavioral: Negative for confusion.    Allergies  Aspirin  Home Medications   Current Outpatient Rx  Name Route Sig Dispense Refill  . ACETAMINOPHEN 500 MG PO TABS Oral Take 1,000 mg by mouth every 6 (six) hours as needed. For pain    . CASANTHRANOL-DOCUSATE SODIUM 30-100 MG PO CAPS Oral Take 2 capsules by mouth daily as needed. For constipation.    Marland Kitchen CIPROFLOXACIN HCL 250 MG PO TABS Oral Take 1 tablet (250 mg total) by mouth 2 (two) times daily. 6 tablet 0  . DOCUSATE SODIUM 100 MG PO CAPS Oral Take 1 capsule (100 mg total) by mouth every 12 (twelve) hours. 60 capsule 0  . ADVAIR DISKUS IN Inhalation Inhale 1 puff into the lungs daily as needed. For shortness of breath    . METRONIDAZOLE 500 MG PO TABS Oral Take 1 tablet (500 mg total) by mouth 3 (three) times daily. 42 tablet 0  . OXYCODONE-ACETAMINOPHEN 5-325 MG PO TABS Oral Take 2 tablets by mouth every 4 (four) hours as needed for pain. 15 tablet 0  . PRAMOXINE HCL 1 % RE FOAM Rectal Place rectally every 2 (two) hours as needed for hemorrhoids. 15 g 0  . PROMETHAZINE HCL 25 MG PO TABS  Oral Take 1 tablet (25 mg total) by mouth every 6 (six) hours as needed for nausea. 20 tablet 0    BP 129/74  Pulse 77  Temp 98.3 F (36.8 C) (Oral)  Resp 19  Ht 5\' 8"  (1.727 m)  Wt 170 lb (77.111 kg)  BMI 25.85 kg/m2  SpO2 100%  Physical Exam  Constitutional: He is oriented to person, place, and time. He appears well-developed and well-nourished.  HENT:  Head: Normocephalic.  Neck: Normal range of motion. Neck supple.  Cardiovascular: Normal rate and regular rhythm.     Pulmonary/Chest: Effort normal and breath sounds normal. He has no wheezes. He has no rales.  Abdominal: Soft. Bowel sounds are normal. There is no rebound and no guarding.       Mild lower quadrant tenderness without rebound, guarding or mass. Tenderness is greater on left than right.  Genitourinary:       No testicular swelling, mass or tenderness.   Musculoskeletal: Normal range of motion. He exhibits no edema.  Neurological: He is alert and oriented to person, place, and time.  Skin: Skin is warm and dry. No rash noted.  Psychiatric: He has a normal mood and affect.    ED Course  Procedures (including critical care time)  Labs Reviewed  BASIC METABOLIC PANEL - Abnormal; Notable for the following:    Glucose, Bld 102 (*)     BUN 5 (*)     All other components within normal limits  CBC WITH DIFFERENTIAL - Abnormal; Notable for the following:    RBC 3.00 (*)     Hemoglobin 8.8 (*)     HCT 26.4 (*)     Platelets 540 (*)     All other components within normal limits  URINALYSIS, ROUTINE W REFLEX MICROSCOPIC   Results for orders placed during the hospital encounter of 02/18/12  URINALYSIS, ROUTINE W REFLEX MICROSCOPIC      Component Value Range   Color, Urine YELLOW  YELLOW   APPearance CLEAR  CLEAR   Specific Gravity, Urine 1.009  1.005 - 1.030   pH 7.0  5.0 - 8.0   Glucose, UA NEGATIVE  NEGATIVE mg/dL   Hgb urine dipstick NEGATIVE  NEGATIVE   Bilirubin Urine NEGATIVE  NEGATIVE   Ketones, ur NEGATIVE  NEGATIVE mg/dL   Protein, ur NEGATIVE  NEGATIVE mg/dL   Urobilinogen, UA 0.2  0.0 - 1.0 mg/dL   Nitrite NEGATIVE  NEGATIVE   Leukocytes, UA NEGATIVE  NEGATIVE  BASIC METABOLIC PANEL      Component Value Range   Sodium 137  135 - 145 mEq/L   Potassium 4.1  3.5 - 5.1 mEq/L   Chloride 102  96 - 112 mEq/L   CO2 25  19 - 32 mEq/L   Glucose, Bld 102 (*) 70 - 99 mg/dL   BUN 5 (*) 6 - 23 mg/dL   Creatinine, Ser 4.54  0.50 - 1.35 mg/dL   Calcium 9.1  8.4 - 09.8 mg/dL   GFR  calc non Af Amer >90  >90 mL/min   GFR calc Af Amer >90  >90 mL/min  CBC WITH DIFFERENTIAL      Component Value Range   WBC 10.5  4.0 - 10.5 K/uL   RBC 3.00 (*) 4.22 - 5.81 MIL/uL   Hemoglobin 8.8 (*) 13.0 - 17.0 g/dL   HCT 11.9 (*) 14.7 - 82.9 %   MCV 88.0  78.0 - 100.0 fL   MCH 29.3  26.0 - 34.0 pg  MCHC 33.3  30.0 - 36.0 g/dL   RDW 45.4  09.8 - 11.9 %   Platelets 540 (*) 150 - 400 K/uL   Neutrophils Relative 73  43 - 77 %   Neutro Abs 7.6  1.7 - 7.7 K/uL   Lymphocytes Relative 19  12 - 46 %   Lymphs Abs 2.0  0.7 - 4.0 K/uL   Monocytes Relative 6  3 - 12 %   Monocytes Absolute 0.6  0.1 - 1.0 K/uL   Eosinophils Relative 2  0 - 5 %   Eosinophils Absolute 0.2  0.0 - 0.7 K/uL   Basophils Relative 1  0 - 1 %   Basophils Absolute 0.1  0.0 - 0.1 K/uL    No results found.   No diagnosis found.  1. Anemia 2. Abdominal pain   MDM  The patient feels improved with pain medication. Review of lab results and comparable recent labs show significant drop in hemoglobin from 13 one week ago to 8 today. Findings were discussed with the patient who states he does not want to be admitted. We discussed in detail the significance of the change in hemoglobin level and implications on his health and that he could decline quickly at home and it become a life threatening condition. He has talked with his family and remains adamant that he will not be admitted today but go for follow up tomorrow at [facility]. He was also informed that admission to [facility] (his choice) could be arranged from here but he still refuses.         Rodena Medin, PA-C 02/18/12 1741  Rodena Medin, PA-C 03/14/12 1035

## 2012-02-18 NOTE — ED Notes (Signed)
Pt c/o groin pain, hematuria and dysuria x 4 days

## 2012-02-18 NOTE — ED Notes (Signed)
Patient stated that he wanted to go home and will come back to hospital if he feels worse tomorrow. States that his abd pain had subsided. Explained to patient that he could be admitted to [facility] since his MD was that, but he declined. Family expressed their desire to him for continuation of care, but patient stated that his desire was to go home.

## 2012-02-19 NOTE — ED Provider Notes (Signed)
Medical screening examination/treatment/procedure(s) were performed by non-physician practitioner and as supervising physician I was immediately available for consultation/collaboration.  Glynn Octave, MD 02/19/12 [phone]

## 2012-03-01 LAB — STOOL CULTURE

## 2012-03-14 NOTE — ED Provider Notes (Signed)
Medical screening examination/treatment/procedure(s) were performed by non-physician practitioner and as supervising physician I was immediately available for consultation/collaboration.  Glynn Octave, MD 03/14/12 1236

## 2014-01-09 DIAGNOSIS — N401 Enlarged prostate with lower urinary tract symptoms: Secondary | ICD-10-CM | POA: Diagnosis not present

## 2014-01-09 DIAGNOSIS — R350 Frequency of micturition: Secondary | ICD-10-CM | POA: Diagnosis not present

## 2014-01-09 DIAGNOSIS — N323 Diverticulum of bladder: Secondary | ICD-10-CM | POA: Diagnosis not present

## 2014-01-22 ENCOUNTER — Ambulatory Visit (HOSPITAL_BASED_OUTPATIENT_CLINIC_OR_DEPARTMENT_OTHER)
Admission: RE | Admit: 2014-01-22 | Discharge: 2014-01-22 | Disposition: A | Discharge status: Home / Self Care | Payer: Medicare Other | Source: Ambulatory Visit | Attending: Medical | Admitting: Medical

## 2014-01-22 ENCOUNTER — Ambulatory Visit (INDEPENDENT_AMBULATORY_CARE_PROVIDER_SITE_OTHER): Payer: Medicare Other | Admitting: Medical

## 2014-01-22 ENCOUNTER — Encounter: Payer: Self-pay | Admitting: Medical

## 2014-01-22 VITALS — BP 135/84 | HR 79 | Temp 98.1°F | Ht 67.5 in | Wt 188.4 lb

## 2014-01-22 DIAGNOSIS — F17200 Nicotine dependence, unspecified, uncomplicated: Secondary | ICD-10-CM | POA: Diagnosis not present

## 2014-01-22 DIAGNOSIS — R05 Cough: Secondary | ICD-10-CM

## 2014-01-22 DIAGNOSIS — Z23 Encounter for immunization: Secondary | ICD-10-CM

## 2014-01-22 DIAGNOSIS — K219 Gastro-esophageal reflux disease without esophagitis: Secondary | ICD-10-CM

## 2014-01-22 DIAGNOSIS — R059 Cough, unspecified: Secondary | ICD-10-CM

## 2014-01-22 DIAGNOSIS — J441 Chronic obstructive pulmonary disease with (acute) exacerbation: Secondary | ICD-10-CM

## 2014-01-22 DIAGNOSIS — J984 Other disorders of lung: Secondary | ICD-10-CM | POA: Diagnosis not present

## 2014-01-22 DIAGNOSIS — L989 Disorder of the skin and subcutaneous tissue, unspecified: Secondary | ICD-10-CM

## 2014-01-22 DIAGNOSIS — J449 Chronic obstructive pulmonary disease, unspecified: Secondary | ICD-10-CM | POA: Diagnosis not present

## 2014-01-22 DIAGNOSIS — N400 Enlarged prostate without lower urinary tract symptoms: Secondary | ICD-10-CM

## 2014-01-22 DIAGNOSIS — J4489 Other specified chronic obstructive pulmonary disease: Secondary | ICD-10-CM | POA: Insufficient documentation

## 2014-01-22 HISTORY — DX: Disorder of the skin and subcutaneous tissue, unspecified: L98.9

## 2014-01-22 HISTORY — DX: Chronic obstructive pulmonary disease with (acute) exacerbation: J44.1

## 2014-01-22 HISTORY — DX: Cough, unspecified: R05.9

## 2014-01-22 MED ORDER — ALBUTEROL SULFATE HFA 108 (90 BASE) MCG/ACT IN AERS: 2.0000 | INHALATION_SPRAY | Freq: Four times a day (QID) | RESPIRATORY_TRACT | Status: DC | PRN

## 2014-01-22 NOTE — Assessment & Plan Note (Signed)
Related to copd. New pt and will get xray. Does not appear acute or infectious presently.

## 2014-01-22 NOTE — Assessment & Plan Note (Signed)
Gerd diet guideline advised. Use zantac otc. If becomes daily consider use of ppi. Only rare occasional now. Not every day gerd.

## 2014-01-22 NOTE — Patient Instructions (Addendum)
Good to meet you.  For your various medical problem.  For your gerd. Eat healthy and if occasional reflux then use zantac otc. If this becomes daily issue then need eval and possible different type med.  For you copd. Would you call and give me dose of the advair you used as discus form then will send rx of that in. Sent albuterol inhaler to pharmacy today. Baseline cxr today also.  Continue to follow up with urologist. Referring to dermatologist to evaluate your skin lesions.  Schedule wellness exam for one month. Please schedule 9:30-10 am. And come in fasting.

## 2014-01-22 NOTE — Assessment & Plan Note (Signed)
Continue meds rx by urologist. Pt considering surgery.

## 2014-01-22 NOTE — Assessment & Plan Note (Signed)
Refer to dermatologist 

## 2014-01-22 NOTE — Assessment & Plan Note (Signed)
Pt advised to call and give me dose of his advair inhaler. Will try to rx discus since he prefers this. Also will send him albuterol to pharmacy. Getting cxr today baseline due to 50 yr smoking hx.

## 2014-01-22 NOTE — Progress Notes (Signed)
Subjective:    Patient ID: JW COVIN, male    DOB: [birth year], 63 y.o.   MRN: [id number]  HPI  Pt is new pt. Reviewed pmh, psh, and fh.   Cataracts hx of. October 20th he is getting surgery.   Pt has gerd. Depends on what he eats. Not daily occurrence. He takes no meds for it. No current  Pt has copd. Pt smoked for 50 years or more. He smokes about pack a day. He used to smoke 3 packs a day. Pt does wheeze daily. Pt uses fluticasone-salmaterol inhaler. He also used advair discus in the past.  Breathing is stable.   Rt ear skin lesion top ear. Also temporal are mole. Lt ear possible  cyst growth for 2 years.   Pt has bph. His specialist recommends surgery. Pt not sure he wants to get surgery.  Pt is on doxasin 4mg  hs and finasteride 5 mg hs   Pt works part time [employer]. Prior [employer], 9th grade education. No exercise. Cup of coffee every morning. 3-4 sodas a day. Not a lot recreation actitvities. Work around house a lot. Married. 6 children.         Review of Systems  Constitutional: Negative for fever, chills and fatigue.  HENT: Negative.   Respiratory: Positive for cough and shortness of breath. Negative for chest tightness and wheezing.        Occasional rare mild cough. Some sob and wheezing varies from day to day but is stable.  Cardiovascular: Negative for chest pain and palpitations.  Gastrointestinal: Negative for nausea, vomiting, abdominal pain, diarrhea, constipation, blood in stool, abdominal distention and rectal pain.       Occasinal gerd.  Genitourinary: Positive for urgency, frequency and difficulty urinating. Negative for flank pain, penile swelling, scrotal swelling, penile pain and testicular pain.       Hx of but symptoms relatively controlled withmed. He is considering surgery.   Musculoskeletal: Negative for back pain, myalgias and neck pain.  Skin:       Various skin lesions see hop.  Neurological: Negative for  dizziness, tremors, seizures, syncope, facial asymmetry, speech difficulty, weakness, light-headedness, numbness and headaches.  Hematological: Negative for adenopathy. Does not bruise/bleed easily.  Psychiatric/Behavioral: Negative.        Objective:   Physical Exam  Constitutional: He is oriented to person, place, and time. He appears well-developed and well-nourished. No distress.  HENT:  Head: Normocephalic and atraumatic.  Nose: Nose normal.  Mouth/Throat: Oropharynx is clear and moist. No oropharyngeal exudate.  Eyes: Conjunctivae and EOM are normal. Pupils are equal, round, and reactive to light. Right eye exhibits no discharge. Left eye exhibits no discharge.  Neck: Normal range of motion. Neck supple. No JVD present. No tracheal deviation present. No thyromegaly present.  Cardiovascular: Normal rate and normal heart sounds.  Exam reveals no gallop and no friction rub.   No murmur heard. Pulmonary/Chest: Breath sounds normal. No stridor. No respiratory distress. He has no wheezes. He has no rales. He exhibits no tenderness.  Mild shallow respiration but even and unlabored.  Abdominal: Soft. Bowel sounds are normal. He exhibits no distension and no mass. There is no tenderness. There is no rebound and no guarding.  Musculoskeletal: He exhibits no edema and no tenderness.  Lymphadenopathy:    He has no cervical adenopathy.  Neurological: He is alert and oriented to person, place, and time.  Skin: Skin is warm. He is not diaphoretic.  Tiop  of rt ear small 28mm dark pigmented and flaky lesion. Lt ear 8 mm cyst like lesion.  Rt temporal area large mole. Posterior thorax- scattered small moles of varying sizes.  Psychiatric: He has a normal mood and affect. His behavior is normal. Judgment and thought content normal.            Assessment & Plan:

## 2014-02-03 DIAGNOSIS — K219 Gastro-esophageal reflux disease without esophagitis: Secondary | ICD-10-CM | POA: Diagnosis not present

## 2014-02-03 DIAGNOSIS — N401 Enlarged prostate with lower urinary tract symptoms: Secondary | ICD-10-CM | POA: Diagnosis not present

## 2014-02-03 DIAGNOSIS — Z23 Encounter for immunization: Secondary | ICD-10-CM | POA: Diagnosis not present

## 2014-02-03 DIAGNOSIS — L989 Disorder of the skin and subcutaneous tissue, unspecified: Secondary | ICD-10-CM | POA: Diagnosis not present

## 2014-02-03 DIAGNOSIS — J438 Other emphysema: Secondary | ICD-10-CM | POA: Diagnosis not present

## 2014-02-17 DIAGNOSIS — H2511 Age-related nuclear cataract, right eye: Secondary | ICD-10-CM | POA: Diagnosis not present

## 2014-02-17 DIAGNOSIS — H18411 Arcus senilis, right eye: Secondary | ICD-10-CM | POA: Diagnosis not present

## 2014-02-17 DIAGNOSIS — H02839 Dermatochalasis of unspecified eye, unspecified eyelid: Secondary | ICD-10-CM | POA: Diagnosis not present

## 2014-02-17 DIAGNOSIS — H25011 Cortical age-related cataract, right eye: Secondary | ICD-10-CM | POA: Diagnosis not present

## 2014-02-20 DIAGNOSIS — L820 Inflamed seborrheic keratosis: Secondary | ICD-10-CM | POA: Diagnosis not present

## 2014-02-20 DIAGNOSIS — L910 Hypertrophic scar: Secondary | ICD-10-CM | POA: Diagnosis not present

## 2014-02-20 DIAGNOSIS — D0421 Carcinoma in situ of skin of right ear and external auricular canal: Secondary | ICD-10-CM | POA: Diagnosis not present

## 2014-02-20 DIAGNOSIS — L821 Other seborrheic keratosis: Secondary | ICD-10-CM | POA: Diagnosis not present

## 2014-02-20 DIAGNOSIS — D485 Neoplasm of uncertain behavior of skin: Secondary | ICD-10-CM | POA: Diagnosis not present

## 2014-02-24 ENCOUNTER — Encounter: Payer: Self-pay | Admitting: Medical

## 2014-02-24 ENCOUNTER — Ambulatory Visit (INDEPENDENT_AMBULATORY_CARE_PROVIDER_SITE_OTHER): Payer: Medicare Other | Admitting: Medical

## 2014-02-24 VITALS — BP 130/84 | HR 76 | Temp 98.2°F | Ht 67.5 in | Wt 188.6 lb

## 2014-02-24 DIAGNOSIS — Z79899 Other long term (current) drug therapy: Secondary | ICD-10-CM

## 2014-02-24 DIAGNOSIS — Z720 Tobacco use: Secondary | ICD-10-CM | POA: Diagnosis not present

## 2014-02-24 DIAGNOSIS — Z0000 Encounter for general adult medical examination without abnormal findings: Secondary | ICD-10-CM | POA: Diagnosis not present

## 2014-02-24 DIAGNOSIS — Z8249 Family history of ischemic heart disease and other diseases of the circulatory system: Secondary | ICD-10-CM

## 2014-02-24 DIAGNOSIS — E785 Hyperlipidemia, unspecified: Secondary | ICD-10-CM

## 2014-02-24 DIAGNOSIS — Z87891 Personal history of nicotine dependence: Secondary | ICD-10-CM

## 2014-02-24 HISTORY — DX: Encounter for general adult medical examination without abnormal findings: Z00.00

## 2014-02-24 LAB — LIPID PANEL
Cholesterol: 175 mg/dL (ref 0–200)
HDL: 29.3 mg/dL — ABNORMAL LOW (ref >39.00)
LDL Cholesterol: 132 mg/dL — ABNORMAL HIGH (ref 0–99)
NONHDL: 145.7
Total CHOL/HDL Ratio: 6
Triglycerides: 67 mg/dL (ref 0.0–149.0)
VLDL: 13.4 mg/dL (ref 0.0–40.0)

## 2014-02-24 LAB — CBC WITH DIFFERENTIAL/PLATELET
BASOS ABS: 0 10*3/uL (ref 0.0–0.1)
Basophils Relative: 0.6 % (ref 0.0–3.0)
Eosinophils Absolute: 0.8 10*3/uL — ABNORMAL HIGH (ref 0.0–0.7)
Eosinophils Relative: 10.7 % — ABNORMAL HIGH (ref 0.0–5.0)
HCT: 45.9 % (ref 39.0–52.0)
Hemoglobin: 15.1 g/dL (ref 13.0–17.0)
LYMPHS ABS: 1.8 10*3/uL (ref 0.7–4.0)
LYMPHS PCT: 23.3 % (ref 12.0–46.0)
MCHC: 33 g/dL (ref 30.0–36.0)
MCV: 88.6 fl (ref 78.0–100.0)
MONOS PCT: 5.1 % (ref 3.0–12.0)
Monocytes Absolute: 0.4 10*3/uL (ref 0.1–1.0)
NEUTROS PCT: 60.3 % (ref 43.0–77.0)
Neutro Abs: 4.6 10*3/uL (ref 1.4–7.7)
PLATELETS: 308 10*3/uL (ref 150.0–400.0)
RBC: 5.19 Mil/uL (ref 4.22–5.81)
RDW: 13.1 % (ref 11.5–15.5)
WBC: 7.6 10*3/uL (ref 4.0–10.5)

## 2014-02-24 LAB — COMPREHENSIVE METABOLIC PANEL
ALT: 15 U/L (ref 0–53)
AST: 14 U/L (ref 0–37)
Albumin: 3.5 g/dL (ref 3.5–5.2)
Alkaline Phosphatase: 83 U/L (ref 39–117)
BILIRUBIN TOTAL: 0.8 mg/dL (ref 0.2–1.2)
BUN: 10 mg/dL (ref 6–23)
CHLORIDE: 106 meq/L (ref 96–112)
CO2: 26 meq/L (ref 19–32)
Calcium: 9.2 mg/dL (ref 8.4–10.5)
Creatinine, Ser: 0.9 mg/dL (ref 0.4–1.5)
GFR: 91.13 mL/min (ref >60.00)
Glucose, Bld: 85 mg/dL (ref 70–99)
POTASSIUM: 4.3 meq/L (ref 3.5–5.1)
SODIUM: 138 meq/L (ref 135–145)
TOTAL PROTEIN: 7.4 g/dL (ref 6.0–8.3)

## 2014-02-24 MED ORDER — ZOSTER VACCINE LIVE 19400 UNT/0.65ML ~~LOC~~ SOLR: 0.6500 mL | Freq: Once | SUBCUTANEOUS | Status: DC

## 2014-02-24 MED ORDER — TETANUS-DIPHTH-ACELL PERTUSSIS 5-2.5-18.5 LF-MCG/0.5 IM SUSP: 0.5000 mL | Freq: Once | INTRAMUSCULAR | Status: DC

## 2014-02-24 NOTE — Patient Instructions (Addendum)
Preventive Care for Adults A healthy lifestyle and preventive care can promote health and wellness. Preventive health guidelines for men include the following key practices:  A routine yearly physical is a good way to check with your health care provider about your health and preventative screening. It is a chance to share any concerns and updates on your health and to receive a thorough exam.  Visit your dentist for a routine exam and preventative care every 6 months. Brush your teeth twice a day and floss once a day. Good oral hygiene prevents tooth decay and gum disease.  The frequency of eye exams is based on your age, health, family medical history, use of contact lenses, and other factors. Follow your health care provider's recommendations for frequency of eye exams.  Eat a healthy diet. Foods such as vegetables, fruits, whole grains, low-fat dairy products, and lean protein foods contain the nutrients you need without too many calories. Decrease your intake of foods high in solid fats, added sugars, and salt. Eat the right amount of calories for you.Get information about a proper diet from your health care provider, if necessary.  Regular physical exercise is one of the most important things you can do for your health. Most adults should get at least 150 minutes of moderate-intensity exercise (any activity that increases your heart rate and causes you to sweat) each week. In addition, most adults need muscle-strengthening exercises on 2 or more days a week.  Maintain a healthy weight. The body mass index (BMI) is a screening tool to identify possible weight problems. It provides an estimate of body fat based on height and weight. Your health care provider can find your BMI and can help you achieve or maintain a healthy weight.For adults 20 years and older:  A BMI below 18.5 is considered underweight.  A BMI of 18.5 to 24.9 is normal.  A BMI of 25 to 29.9 is considered overweight.  A BMI  of 30 and above is considered obese.  Maintain normal blood lipids and cholesterol levels by exercising and minimizing your intake of saturated fat. Eat a balanced diet with plenty of fruit and vegetables. Blood tests for lipids and cholesterol should begin at age 50 and be repeated every 5 years. If your lipid or cholesterol levels are high, you are over 50, or you are at high risk for heart disease, you may need your cholesterol levels checked more frequently.Ongoing high lipid and cholesterol levels should be treated with medicines if diet and exercise are not working.  If you smoke, find out from your health care provider how to quit. If you do not use tobacco, do not start.  Lung cancer screening is recommended for adults aged 73-80 years who are at high risk for developing lung cancer because of a history of smoking. A yearly low-dose CT scan of the lungs is recommended for people who have at least a 30-pack-year history of smoking and are a current smoker or have quit within the past 15 years. A pack year of smoking is smoking an average of 1 pack of cigarettes a day for 1 year (for example: 1 pack a day for 30 years or 2 packs a day for 15 years). Yearly screening should continue until the smoker has stopped smoking for at least 15 years. Yearly screening should be stopped for people who develop a health problem that would prevent them from having lung cancer treatment.  If you choose to drink alcohol, do not have more than  2 drinks per day. One drink is considered to be 12 ounces (355 mL) of beer, 5 ounces (148 mL) of wine, or 1.5 ounces (44 mL) of liquor.  Avoid use of street drugs. Do not share needles with anyone. Ask for help if you need support or instructions about stopping the use of drugs.  High blood pressure causes heart disease and increases the risk of stroke. Your blood pressure should be checked at least every 1-2 years. Ongoing high blood pressure should be treated with  medicines, if weight loss and exercise are not effective.  If you are 45-79 years old, ask your health care provider if you should take aspirin to prevent heart disease.  Diabetes screening involves taking a blood sample to check your fasting blood sugar level. This should be done once every 3 years, after age 45, if you are within normal weight and without risk factors for diabetes. Testing should be considered at a younger age or be carried out more frequently if you are overweight and have at least 1 risk factor for diabetes.  Colorectal cancer can be detected and often prevented. Most routine colorectal cancer screening begins at the age of 50 and continues through age 75. However, your health care provider may recommend screening at an earlier age if you have risk factors for colon cancer. On a yearly basis, your health care provider may provide home test kits to check for hidden blood in the stool. Use of a small camera at the end of a tube to directly examine the colon (sigmoidoscopy or colonoscopy) can detect the earliest forms of colorectal cancer. Talk to your health care provider about this at age 50, when routine screening begins. Direct exam of the colon should be repeated every 5-10 years through age 75, unless early forms of precancerous polyps or small growths are found.  People who are at an increased risk for hepatitis B should be screened for this virus. You are considered at high risk for hepatitis B if:  You were born in a country where hepatitis B occurs often. Talk with your health care provider about which countries are considered high risk.  Your parents were born in a high-risk country and you have not received a shot to protect against hepatitis B (hepatitis B vaccine).  You have HIV or AIDS.  You use needles to inject street drugs.  You live with, or have sex with, someone who has hepatitis B.  You are a man who has sex with other men (MSM).  You get hemodialysis  treatment.  You take certain medicines for conditions such as cancer, organ transplantation, and autoimmune conditions.  Hepatitis C blood testing is recommended for all people born from 1945 through 1965 and any individual with known risks for hepatitis C.  Practice safe sex. Use condoms and avoid high-risk sexual practices to reduce the spread of sexually transmitted infections (STIs). STIs include gonorrhea, chlamydia, syphilis, trichomonas, herpes, HPV, and human immunodeficiency virus (HIV). Herpes, HIV, and HPV are viral illnesses that have no cure. They can result in disability, cancer, and death.  If you are at risk of being infected with HIV, it is recommended that you take a prescription medicine daily to prevent HIV infection. This is called preexposure prophylaxis (PrEP). You are considered at risk if:  You are a man who has sex with other men (MSM) and have other risk factors.  You are a heterosexual man, are sexually active, and are at increased risk for HIV infection.    You take drugs by injection.  You are sexually active with a partner who has HIV.  Talk with your health care provider about whether you are at high risk of being infected with HIV. If you choose to begin PrEP, you should first be tested for HIV. You should then be tested every 3 months for as long as you are taking PrEP.  A one-time screening for abdominal aortic aneurysm (AAA) and surgical repair of large AAAs by ultrasound are recommended for men ages 32 to 67 years who are current or former smokers.  Healthy men should no longer receive prostate-specific antigen (PSA) blood tests as part of routine cancer screening. Talk with your health care provider about prostate cancer screening.  Testicular cancer screening is not recommended for adult males who have no symptoms. Screening includes self-exam, a health care provider exam, and other screening tests. Consult with your health care provider about any symptoms  you have or any concerns you have about testicular cancer.  Use sunscreen. Apply sunscreen liberally and repeatedly throughout the day. You should seek shade when your shadow is shorter than you. Protect yourself by wearing long sleeves, pants, a wide-brimmed hat, and sunglasses year round, whenever you are outdoors.  Once a month, do a whole-body skin exam, using a mirror to look at the skin on your back. Tell your health care provider about new moles, moles that have irregular borders, moles that are larger than a pencil eraser, or moles that have changed in shape or color.  Stay current with required vaccines (immunizations).  Influenza vaccine. All adults should be immunized every year.  Tetanus, diphtheria, and acellular pertussis (Td, Tdap) vaccine. An adult who has not previously received Tdap or who does not know his vaccine status should receive 1 dose of Tdap. This initial dose should be followed by tetanus and diphtheria toxoids (Td) booster doses every 10 years. Adults with an unknown or incomplete history of completing a 3-dose immunization series with Td-containing vaccines should begin or complete a primary immunization series including a Tdap dose. Adults should receive a Td booster every 10 years.  Varicella vaccine. An adult without evidence of immunity to varicella should receive 2 doses or a second dose if he has previously received 1 dose.  Human papillomavirus (HPV) vaccine. Males aged 68-21 years who have not received the vaccine previously should receive the 3-dose series. Males aged 22-26 years may be immunized. Immunization is recommended through the age of 6 years for any male who has sex with males and did not get any or all doses earlier. Immunization is recommended for any person with an immunocompromised condition through the age of 49 years if he did not get any or all doses earlier. During the 3-dose series, the second dose should be obtained 4-8 weeks after the first  dose. The third dose should be obtained 24 weeks after the first dose and 16 weeks after the second dose.  Zoster vaccine. One dose is recommended for adults aged 50 years or older unless certain conditions are present.  Measles, mumps, and rubella (MMR) vaccine. Adults born before [birth year] generally are considered immune to measles and mumps. Adults born in [birth year] or later should have 1 or more doses of MMR vaccine unless there is a contraindication to the vaccine or there is laboratory evidence of immunity to each of the three diseases. A routine second dose of MMR vaccine should be obtained at least 28 days after the first dose for students attending postsecondary  schools, health care workers, or international travelers. People who received inactivated measles vaccine or an unknown type of measles vaccine during 1963-1967 should receive 2 doses of MMR vaccine. People who received inactivated mumps vaccine or an unknown type of mumps vaccine before 1979 and are at high risk for mumps infection should consider immunization with 2 doses of MMR vaccine. Unvaccinated health care workers born before 1957 who lack laboratory evidence of measles, mumps, or rubella immunity or laboratory confirmation of disease should consider measles and mumps immunization with 2 doses of MMR vaccine or rubella immunization with 1 dose of MMR vaccine.  Pneumococcal 13-valent conjugate (PCV13) vaccine. When indicated, a person who is uncertain of his immunization history and has no record of immunization should receive the PCV13 vaccine. An adult aged 19 years or older who has certain medical conditions and has not been previously immunized should receive 1 dose of PCV13 vaccine. This PCV13 should be followed with a dose of pneumococcal polysaccharide (PPSV23) vaccine. The PPSV23 vaccine dose should be obtained at least 8 weeks after the dose of PCV13 vaccine. An adult aged 19 years or older who has certain medical conditions and  previously received 1 or more doses of PPSV23 vaccine should receive 1 dose of PCV13. The PCV13 vaccine dose should be obtained 1 or more years after the last PPSV23 vaccine dose.  Pneumococcal polysaccharide (PPSV23) vaccine. When PCV13 is also indicated, PCV13 should be obtained first. All adults aged 65 years and older should be immunized. An adult younger than age 65 years who has certain medical conditions should be immunized. Any person who resides in a nursing home or long-term care facility should be immunized. An adult smoker should be immunized. People with an immunocompromised condition and certain other conditions should receive both PCV13 and PPSV23 vaccines. People with human immunodeficiency virus (HIV) infection should be immunized as soon as possible after diagnosis. Immunization during chemotherapy or radiation therapy should be avoided. Routine use of PPSV23 vaccine is not recommended for American Indians, [place] Natives, or people younger than 65 years unless there are medical conditions that require PPSV23 vaccine. When indicated, people who have unknown immunization and have no record of immunization should receive PPSV23 vaccine. One-time revaccination 5 years after the first dose of PPSV23 is recommended for people aged 19-64 years who have chronic kidney failure, nephrotic syndrome, asplenia, or immunocompromised conditions. People who received 1-2 doses of PPSV23 before age 65 years should receive another dose of PPSV23 vaccine at age 65 years or later if at least 5 years have passed since the previous dose. Doses of PPSV23 are not needed for people immunized with PPSV23 at or after age 65 years.  Meningococcal vaccine. Adults with asplenia or persistent complement component deficiencies should receive 2 doses of quadrivalent meningococcal conjugate (MenACWY-D) vaccine. The doses should be obtained at least 2 months apart. Microbiologists working with certain meningococcal bacteria,  military recruits, people at risk during an outbreak, and people who travel to or live in countries with a high rate of meningitis should be immunized. A first-year college student up through age 21 years who is living in a residence hall should receive a dose if he did not receive a dose on or after his 16th birthday. Adults who have certain high-risk conditions should receive one or more doses of vaccine.  Hepatitis A vaccine. Adults who wish to be protected from this disease, have certain high-risk conditions, work with hepatitis A-infected animals, work in hepatitis A research labs, or   travel to or work in countries with a high rate of hepatitis A should be immunized. Adults who were previously unvaccinated and who anticipate close contact with an international adoptee during the first 60 days after arrival in the [place] from a country with a high rate of hepatitis A should be immunized.  Hepatitis B vaccine. Adults should be immunized if they wish to be protected from this disease, have certain high-risk conditions, may be exposed to blood or other infectious body fluids, are household contacts or sex partners of hepatitis B positive people, are clients or workers in certain care facilities, or travel to or work in countries with a high rate of hepatitis B.  Haemophilus influenzae type b (Hib) vaccine. A previously unvaccinated person with asplenia or sickle cell disease or having a scheduled splenectomy should receive 1 dose of Hib vaccine. Regardless of previous immunization, a recipient of a hematopoietic stem cell transplant should receive a 3-dose series 6-12 months after his successful transplant. Hib vaccine is not recommended for adults with HIV infection. Preventive Service / Frequency Ages 52 to 17  Blood pressure check.** / Every 1 to 2 years.  Lipid and cholesterol check.** / Every 5 years beginning at age 69.  Hepatitis C blood test.** / For any individual with known risks for  hepatitis C.  Skin self-exam. / Monthly.  Influenza vaccine. / Every year.  Tetanus, diphtheria, and acellular pertussis (Tdap, Td) vaccine.** / Consult your health care provider. 1 dose of Td every 10 years.  Varicella vaccine.** / Consult your health care provider.  HPV vaccine. / 3 doses over 6 months, if 72 or younger.  Measles, mumps, rubella (MMR) vaccine.** / You need at least 1 dose of MMR if you were born in 1957 or later. You may also need a second dose.  Pneumococcal 13-valent conjugate (PCV13) vaccine.** / Consult your health care provider.  Pneumococcal polysaccharide (PPSV23) vaccine.** / 1 to 2 doses if you smoke cigarettes or if you have certain conditions.  Meningococcal vaccine.** / 1 dose if you are age 35 to 60 years and a Market researcher living in a residence hall, or have one of several medical conditions. You may also need additional booster doses.  Hepatitis A vaccine.** / Consult your health care provider.  Hepatitis B vaccine.** / Consult your health care provider.  Haemophilus influenzae type b (Hib) vaccine.** / Consult your health care provider. Ages 35 to 8  Blood pressure check.** / Every 1 to 2 years.  Lipid and cholesterol check.** / Every 5 years beginning at age 57.  Lung cancer screening. / Every year if you are aged 44-80 years and have a 30-pack-year history of smoking and currently smoke or have quit within the past 15 years. Yearly screening is stopped once you have quit smoking for at least 15 years or develop a health problem that would prevent you from having lung cancer treatment.  Fecal occult blood test (FOBT) of stool. / Every year beginning at age 55 and continuing until age 73. You may not have to do this test if you get a colonoscopy every 10 years.  Flexible sigmoidoscopy** or colonoscopy.** / Every 5 years for a flexible sigmoidoscopy or every 10 years for a colonoscopy beginning at age 28 and continuing until age  1.  Hepatitis C blood test.** / For all people born from [birth year] through 1965 and any individual with known risks for hepatitis C.  Skin self-exam. / Monthly.  Influenza vaccine. / Every  year.  Tetanus, diphtheria, and acellular pertussis (Tdap/Td) vaccine.** / Consult your health care provider. 1 dose of Td every 10 years.  Varicella vaccine.** / Consult your health care provider.  Zoster vaccine.** / 1 dose for adults aged 77 years or older.  Measles, mumps, rubella (MMR) vaccine.** / You need at least 1 dose of MMR if you were born in 1957 or later. You may also need a second dose.  Pneumococcal 13-valent conjugate (PCV13) vaccine.** / Consult your health care provider.  Pneumococcal polysaccharide (PPSV23) vaccine.** / 1 to 2 doses if you smoke cigarettes or if you have certain conditions.  Meningococcal vaccine.** / Consult your health care provider.  Hepatitis A vaccine.** / Consult your health care provider.  Hepatitis B vaccine.** / Consult your health care provider.  Haemophilus influenzae type b (Hib) vaccine.** / Consult your health care provider. Ages 69 and over  Blood pressure check.** / Every 1 to 2 years.  Lipid and cholesterol check.**/ Every 5 years beginning at age 1.  Lung cancer screening. / Every year if you are aged 9-80 years and have a 30-pack-year history of smoking and currently smoke or have quit within the past 15 years. Yearly screening is stopped once you have quit smoking for at least 15 years or develop a health problem that would prevent you from having lung cancer treatment.  Fecal occult blood test (FOBT) of stool. / Every year beginning at age 78 and continuing until age 101. You may not have to do this test if you get a colonoscopy every 10 years.  Flexible sigmoidoscopy** or colonoscopy.** / Every 5 years for a flexible sigmoidoscopy or every 10 years for a colonoscopy beginning at age 59 and continuing until age 16.  Hepatitis C blood  test.** / For all people born from [birth year] through 1965 and any individual with known risks for hepatitis C.  Abdominal aortic aneurysm (AAA) screening.** / A one-time screening for ages 48 to 55 years who are current or former smokers.  Skin self-exam. / Monthly.  Influenza vaccine. / Every year.  Tetanus, diphtheria, and acellular pertussis (Tdap/Td) vaccine.** / 1 dose of Td every 10 years.  Varicella vaccine.** / Consult your health care provider.  Zoster vaccine.** / 1 dose for adults aged 11 years or older.  Pneumococcal 13-valent conjugate (PCV13) vaccine.** / Consult your health care provider.  Pneumococcal polysaccharide (PPSV23) vaccine.** / 1 dose for all adults aged 29 years and older.  Meningococcal vaccine.** / Consult your health care provider.  Hepatitis A vaccine.** / Consult your health care provider.  Hepatitis B vaccine.** / Consult your health care provider.  Haemophilus influenzae type b (Hib) vaccine.** / Consult your health care provider. **Family history and personal history of risk and conditions may change your health care provider's recommendations. Document Released: 06/13/2001 Document Revised: 04/22/2013 Document Reviewed: 09/12/2010 [facility] Patient Information 2015 [place], [place]. This information is not intended to replace advice given to you by your health care provider. Make sure you discuss any questions you have with your health care provider.  Tdap and zostavax at his pharmacy. We need name of MD who did pneumovaccine and colonoscopy. So we can document. Then can do second pneumovaccine in one year.  Follow up in date to be determined after labs are reviewed.

## 2014-02-24 NOTE — Progress Notes (Signed)
Pre visit review using our clinic review tool, if applicable. No additional management support is needed unless otherwise documented below in the visit note. 

## 2014-02-24 NOTE — Assessment & Plan Note (Addendum)
Will put in order to get fasting wellness labs. Cbc, cmp, lipid panel today. Tdap and zostavax sent to pharmacy. ekg done today. Pt will get [place] names of his GI and prior pcp so we can get pneumovaccine documented in chart and his colonosocpy documented.

## 2014-02-24 NOTE — Progress Notes (Signed)
Subjective:    Patient ID: Patrick Morris, male    DOB: [birth year], 63 y.o.   MRN: [id number]  HPI  Pt in for annual wellness exam.  Pt last labs in our records were 01-2012. Among those do not see lipid panel.  Pt looks like he overdue for tetanus, zostavax and pneumococal. But one month ago he said he got pneumonia vaccine at another office.So will ask LPN to call that office and get results. Or go to [place] [facility] and find out. Will give second pneumovaccine in one yr.  Our records indicate colonoscopy due but he states one was done a year ago. Will ask staff to call pt and investigate this as well.  Pt  never had an ekg on chart review.  Will put in fasting labs today.  History of hyperlipidemia. Years ago but only took meds briefly. He is a long time smoker with FH of stroke and mi so this will be important to treat on review after labs are back.    Review of Systems See ros on [employer] set.    Objective:   Physical Exam  See PE on medicare smart set.      Assessment & Plan:  See medicare smart set. Subjective:    Patrick Morris is a 63 y.o. male who presents for a welcome to Medicare exam.   Cardiac risk factors: his age and he smokes.. Family history of stroke(Dad). Uncles had MI.  Depression Screen (Note: if answer to either of the following is "Yes", a more complete depression screening is indicated)  Q1: Over the past two weeks, have you felt down, depressed or hopeless? no Q2: Over the past two weeks, have you felt little interest or pleasure in doing things? no  Activities of Daily Living In your present state of health, do you have any difficulty performing the following activities?:  Preparing food and eating?: No Bathing yourself: No Getting dressed: No Using the toilet:No Moving around from place to place: No In the past year have you fallen or had a near fall?:No  Current exercise habits: Pt does walk alot at work. Walks with is wife.    Dietary issues discussed: No dietary issues. Hearing difficulties: No Safe in current home environment: yes  Safe environment. Review of Systems A comprehensive review of systems was negative.    Objective:    Vision by Snellen chart: right KZS:WFUXNAT declines measurement, left FTD:DUKGURK declines measurement(pt saw opthalmologist just recenty and getting cataract surgery. Blood pressure 130/84, pulse 76, temperature 98.2 F (36.8 C), temperature source Oral, height 5' 7.5" (1.715 m), weight 188 lb 9.6 oz (85.548 kg), SpO2 95.00%. Body mass index is 29.09 kg/(m^2).   General Mental Status- Alert. Orientation- Oriented x3.  Build and Nutrition- Well nourished and Well Developed.  Skin General:-Normal. Color- Normal color. Moisture- Normal. Temperature-Warm.  HEENT  Ears- Normal. Auditory Canal- Bilateral-Normal. Tympanic Membrane- Bilateral-Normal. Eye Fundi-Bilateral-Normal. Pupil- bilateral- Direct reaction to light normal. Nose & Sinuses- Normal. Nostrils-Bilateral- Normal. Mouth & Throat-Normal.  Neck Neck- No Bruits or Masses. Trachea midline.  Thyroid- Normal.  Chest and Lung Exam Percussion: Quality and Intensity-Percussion normal. Percussion of the chest reveals- No Dullness.  Palpation: Palpation of the chest reveals- Non-tender- No dullness. Auscultation: Breath Sounds- Normal.  Adventitous Sounds:-No adventitious sounds.  Cardiovascular Inspection:- No Heaves. Auscultation:-Normal sinus rhythm without murmur gallop, S1 WNL and S2 WNL.  Abdomen Inspection:-Inspection Normal. Inspection of the abdomen reveals- No hernias Palpation/Percussion:- Palpation and Percussion of  the Abdomen reveal- Non Tender and No Palpable abdominal masses. Liver: Other Characteristics- No hepatomegaly. Spleen:Other Characteristics- No Splenomegaly. Auscultation:- Auscultation of the abdomen reveals- Bowel sounds normal and No Abdominal bruits.  Gential- he defers sees  urologist tomorrow.  Rectal Pt will see urologist tomorrow so he defers(Also colonosocopy done last year. Cleared for 10 yrs. So he defers.  Peripheral  Vascular Lower Extremity:Inspection- Bilateral-Inspection Normal  Palpation: Femoral pulse- Bilateral- 2+. Popliteal pulse- Bilateral-2+. Dorsalis pedis pulse- Bilateral- 1/2+. Edema- Bilateral- No edema.  Neurologic Mental Status:- Normal. Cranial Nerves:-Normal Bilaterally. Motor:-Normal. Strength:5/5 normal muscle strength-All Muscles. General Assessment of Reflexes: Right Knee-2+. Left Knee- 2+. Coordination-Normal. Gait- Normal.  Meningeal Signs- None.  Musculoskeletal Global Assessment General-Joints show full range of motion without obvious deformity and Normal muscle mass. Strength in upper and lower extremities.  Lymphatics General lymphatics Description- No generalized lymphadenopathy.   Assessment:     Patient presents for yearly preventative medicine examination. Medicare questionnaire was completed  All immunizations and health maintenance protocols were reviewed with the patient and needed orders were placed.  Appropriate screening laboratory values were ordered for the patient including screening of hyperlipidemia, renal function and hepatic function. If indicated by BPH, a PSA was ordered.  Medication reconciliation,  past medical history, social history, problem list and allergies were reviewed in detail with the patient  Goals were established with regard to weight loss, exercise, and  diet in compliance with medications  End of life planning was discussed.     Plan:    During the course of the visit the patient was educated and counseled about appropriate screening and preventive services including:   fluvaccine done last visit. 1 month ago another provider gave pneumonia.  Pt declines zostavax.(Pt not sure if he ever had). He not sure if he had chicken pox of vaccine. Will give order of zostax and  tdap. He can take to pharmacy. ekg done today. Labs fasting today.  Patient Instructions (the written plan) was given to the patient.

## 2014-02-25 DIAGNOSIS — N401 Enlarged prostate with lower urinary tract symptoms: Secondary | ICD-10-CM | POA: Diagnosis not present

## 2014-03-02 DIAGNOSIS — C44222 Squamous cell carcinoma of skin of right ear and external auricular canal: Secondary | ICD-10-CM | POA: Diagnosis not present

## 2014-03-16 DIAGNOSIS — H2512 Age-related nuclear cataract, left eye: Secondary | ICD-10-CM | POA: Diagnosis not present

## 2014-03-16 DIAGNOSIS — H269 Unspecified cataract: Secondary | ICD-10-CM | POA: Diagnosis not present

## 2014-03-17 DIAGNOSIS — H2511 Age-related nuclear cataract, right eye: Secondary | ICD-10-CM | POA: Diagnosis not present

## 2014-03-23 DIAGNOSIS — H2512 Age-related nuclear cataract, left eye: Secondary | ICD-10-CM | POA: Diagnosis not present

## 2014-06-18 DIAGNOSIS — D225 Melanocytic nevi of trunk: Secondary | ICD-10-CM | POA: Diagnosis not present

## 2014-06-18 DIAGNOSIS — L821 Other seborrheic keratosis: Secondary | ICD-10-CM | POA: Diagnosis not present

## 2014-06-18 DIAGNOSIS — Z08 Encounter for follow-up examination after completed treatment for malignant neoplasm: Secondary | ICD-10-CM | POA: Diagnosis not present

## 2014-06-18 DIAGNOSIS — D485 Neoplasm of uncertain behavior of skin: Secondary | ICD-10-CM | POA: Diagnosis not present

## 2014-06-18 DIAGNOSIS — C44229 Squamous cell carcinoma of skin of left ear and external auricular canal: Secondary | ICD-10-CM | POA: Diagnosis not present

## 2014-06-18 DIAGNOSIS — Z85828 Personal history of other malignant neoplasm of skin: Secondary | ICD-10-CM | POA: Diagnosis not present

## 2014-06-18 DIAGNOSIS — L570 Actinic keratosis: Secondary | ICD-10-CM | POA: Diagnosis not present

## 2014-08-10 DIAGNOSIS — C44229 Squamous cell carcinoma of skin of left ear and external auricular canal: Secondary | ICD-10-CM | POA: Diagnosis not present

## 2015-03-02 ENCOUNTER — Ambulatory Visit (INDEPENDENT_AMBULATORY_CARE_PROVIDER_SITE_OTHER): Payer: Medicare Other | Admitting: Medical

## 2015-03-02 ENCOUNTER — Encounter: Payer: Self-pay | Admitting: Medical

## 2015-03-02 VITALS — BP 118/80 | HR 68 | Temp 97.4°F | Ht 67.5 in | Wt 182.6 lb

## 2015-03-02 DIAGNOSIS — F17200 Nicotine dependence, unspecified, uncomplicated: Secondary | ICD-10-CM

## 2015-03-02 DIAGNOSIS — R059 Cough, unspecified: Secondary | ICD-10-CM

## 2015-03-02 DIAGNOSIS — R05 Cough: Secondary | ICD-10-CM

## 2015-03-02 DIAGNOSIS — Z720 Tobacco use: Secondary | ICD-10-CM

## 2015-03-02 DIAGNOSIS — J0100 Acute maxillary sinusitis, unspecified: Secondary | ICD-10-CM | POA: Diagnosis not present

## 2015-03-02 DIAGNOSIS — J208 Acute bronchitis due to other specified organisms: Secondary | ICD-10-CM | POA: Diagnosis not present

## 2015-03-02 DIAGNOSIS — R062 Wheezing: Secondary | ICD-10-CM | POA: Diagnosis not present

## 2015-03-02 MED ORDER — AZITHROMYCIN 250 MG PO TABS: ORAL_TABLET | ORAL | Status: DC

## 2015-03-02 MED ORDER — METHYLPREDNISOLONE ACETATE 40 MG/ML IJ SUSP
40.0000 mg | Freq: Once | INTRAMUSCULAR | Status: AC
  Administered 2015-03-02: 40 mg via INTRAMUSCULAR

## 2015-03-02 MED ORDER — CEFTRIAXONE SODIUM 1 G IJ SOLR
1.0000 g | Freq: Once | INTRAMUSCULAR | Status: AC
  Administered 2015-03-02: 1 g via INTRAMUSCULAR

## 2015-03-02 MED ORDER — FLUTICASONE-SALMETEROL 250-50 MCG/DOSE IN AEPB: 1.0000 | INHALATION_SPRAY | Freq: Once | RESPIRATORY_TRACT | Status: DC

## 2015-03-02 MED ORDER — HYDROCODONE-HOMATROPINE 5-1.5 MG/5ML PO SYRP: 5.0000 mL | ORAL_SOLUTION | Freq: Three times a day (TID) | ORAL | Status: DC | PRN

## 2015-03-02 MED ORDER — ALBUTEROL SULFATE HFA 108 (90 BASE) MCG/ACT IN AERS: 2.0000 | INHALATION_SPRAY | Freq: Four times a day (QID) | RESPIRATORY_TRACT | Status: DC | PRN

## 2015-03-02 NOTE — Addendum Note (Signed)
Addended by: Tasia Catchings on: 03/02/2015 12:48 PM   Modules accepted: Medications

## 2015-03-02 NOTE — Addendum Note (Signed)
Addended by: Tasia Catchings on: 03/02/2015 12:54 PM   Modules accepted: Orders

## 2015-03-02 NOTE — Patient Instructions (Signed)
Rocephin and DepoMedrol IM in office. With bronchitis, sinusitis, wheezing, subjective fever, and hx of heavy smoking, I decided to treat you aggressively.  Also rx azithromycin, hydocan cough syrup. Refill your advair. And made albuterol inhaler available.  Follow up in 7 days or as needed.  If you want to consider stopping smoking then could rx wellbutrin. Just let me know.

## 2015-03-02 NOTE — Progress Notes (Signed)
Pre visit review using our clinic review tool, if applicable. No additional management support is needed unless otherwise documented below in the visit note. 

## 2015-03-02 NOTE — Progress Notes (Signed)
Subjective:    Patient ID: Patrick Morris, male    DOB: 1950-08-27, 64 y.o.   MRN: [id number]  HPI   Pt in 3 wks of nasal congestion, sinus pressure and productive cough. Pt feels tired with illness. Occasional subjective fever. Pt smokes about a pack a day. Pt has been wheezing some and coughing some at night.  Some sneezing and itching eyes.  Pt mentions he ran out of advair.  Review of Systems  Constitutional: Negative for fever, chills and fatigue.       Possible fever at times.  HENT: Positive for congestion, postnasal drip, sinus pressure and sneezing. Negative for drooling, ear pain and sore throat.   Respiratory: Positive for wheezing. Negative for cough and shortness of breath.   Cardiovascular: Negative for chest pain and palpitations.  Neurological: Negative for dizziness, speech difficulty, weakness and headaches.  Hematological: Negative for adenopathy. Does not bruise/bleed easily.  Psychiatric/Behavioral: Negative for behavioral problems and confusion.    Past Medical History  Diagnosis Date  . COPD (chronic obstructive pulmonary disease) ([place])   . Sinus infection   . Ear infection   . Colitis   . Salmonella   . Cataract   . GERD (gastroesophageal reflux disease)   . BPH (benign prostatic hyperplasia)     his specialist is recommending surgery.    Social History   Social History  . Marital Status: Married    Spouse Name: N/A  . Number of Children: N/A  . Years of Education: N/A   Occupational History  . Not on file.   Social History Main Topics  . Smoking status: Current Every Day Smoker -- 1.00 packs/day  . Smokeless tobacco: Not on file  . Alcohol Use: Yes  . Drug Use: No  . Sexual Activity: Yes    Birth Control/ Protection: None   Other Topics Concern  . Not on file   Social History Narrative    No past surgical history on file.  Family History  Problem Relation Age of Onset  . Hyperlipidemia Father   . Stroke Father   . Alcohol  abuse Father   . Throat cancer Brother   . Lymphoma Brother     Allergies  Allergen Reactions  . Aspirin Other (See Comments)    Acid reflux and drooling    Current Outpatient Prescriptions on File Prior to Visit  Medication Sig Dispense Refill  . acetaminophen (TYLENOL) 500 MG tablet Take 1,000 mg by mouth every 6 (six) hours as needed. For pain    . albuterol (PROVENTIL HFA;VENTOLIN HFA) 108 (90 BASE) MCG/ACT inhaler Inhale 2 puffs into the lungs every 6 (six) hours as needed for wheezing or shortness of breath. 1 Inhaler 0  . Casanthranol-Docusate Sodium 30-100 MG CAPS Take 2 capsules by mouth daily as needed. For constipation.    . docusate sodium (COLACE) 100 MG capsule Take 1 capsule (100 mg total) by mouth every 12 (twelve) hours. 60 capsule 0  . doxazosin (CARDURA) 4 MG tablet Take 4 mg by mouth daily.    . finasteride (PROSCAR) 5 MG tablet Take 5 mg by mouth daily.    . Fluticasone-Salmeterol (ADVAIR DISKUS IN) Inhale 1 puff into the lungs daily as needed. For shortness of breath    . oxyCODONE-acetaminophen (PERCOCET/ROXICET) 5-325 MG per tablet Take 2 tablets by mouth every 4 (four) hours as needed for pain. 15 tablet 0  . pramoxine (PROCTOFOAM) 1 % foam Place rectally every 2 (two) hours as needed for  hemorrhoids. 15 g 0  . Tdap (BOOSTRIX) 5-2.5-18.5 LF-MCG/0.5 injection Inject 0.5 mLs into the muscle once. (Patient not taking: Reported on 03/02/2015) 0.5 mL 0  . zoster vaccine live, PF, (ZOSTAVAX) 04888 UNT/0.65ML injection Inject 19,400 Units into the skin once. (Patient not taking: Reported on 03/02/2015) 1 each 0   No current facility-administered medications on file prior to visit.    BP 118/80 mmHg  Pulse 68  Temp(Src) 97.4 F (36.3 C) (Oral)  Ht 5' 7.5" (1.715 m)  Wt 182 lb 9.6 oz (82.827 kg)  BMI 28.16 kg/m2  SpO2 97%       Objective:   Physical Exam  General  Mental Status - Alert. General Appearance - Well groomed. Not in acute  distress.  Skin Rashes- No Rashes.  HEENT Head- Normal. Ear Auditory Canal - Left- Normal. Right - Normal.Tympanic Membrane- Left- Normal. Right- Normal. Eye Sclera/Conjunctiva- Left- Normal. Right- Normal. Nose & Sinuses Nasal Mucosa- Left-  Boggy and Congested. Right-  Boggy and  Congested.Bilateral very faint  maxillary but no frontal sinus pressure. Mouth & Throat Lips: Upper Lip- Normal: no dryness, cracking, pallor, cyanosis, or vesicular eruption. Lower Lip-Normal: no dryness, cracking, pallor, cyanosis or vesicular eruption. Buccal Mucosa- Bilateral- No Aphthous ulcers. Oropharynx- No Discharge or Erythema. Tonsils: Characteristics- Bilateral- No Erythema or Congestion. Size/Enlargement- Bilateral- No enlargement. Discharge- bilateral-None.  Neck Neck- Supple. No Masses.   Chest and Lung Exam Auscultation: Breath Sounds:-even and unlabored, but scattered rhonchi and expiratory wheeze.  Cardiovascular Auscultation:Rythm- Regular, rate and rhythm. Murmurs & Other Heart Sounds:Ausculatation of the heart reveal- No Murmurs.  Lymphatic Head & Neck General Head & Neck Lymphatics: Bilateral: Description- No Localized lymphadenopathy.       Assessment & Plan:  Rocephin and DepoMedrol IM in office. With bronchitis, sinusitis, wheezing, subjective fever, and hx of heavy smoking, I decided to treat you aggressively.  Also rx azithromycin, hydocan cough syrup. Refill your advair. And made albuterol inhaler available.  Follow up in 7 days or as needed.  If you want to consider stopping smoking then could rx wellbutrin. Just let me

## 2015-03-16 DIAGNOSIS — N138 Other obstructive and reflux uropathy: Secondary | ICD-10-CM | POA: Diagnosis not present

## 2015-03-16 DIAGNOSIS — N401 Enlarged prostate with lower urinary tract symptoms: Secondary | ICD-10-CM | POA: Diagnosis not present

## 2015-03-16 DIAGNOSIS — R339 Retention of urine, unspecified: Secondary | ICD-10-CM | POA: Diagnosis not present

## 2015-05-25 ENCOUNTER — Encounter: Payer: Self-pay | Admitting: Medical

## 2015-05-25 ENCOUNTER — Telehealth: Payer: Self-pay | Admitting: Medical

## 2015-05-25 ENCOUNTER — Ambulatory Visit (INDEPENDENT_AMBULATORY_CARE_PROVIDER_SITE_OTHER): Payer: Medicare Other | Admitting: Medical

## 2015-05-25 VITALS — BP 114/76 | HR 74 | Temp 97.6°F | Ht 67.5 in | Wt 176.4 lb

## 2015-05-25 DIAGNOSIS — J0100 Acute maxillary sinusitis, unspecified: Secondary | ICD-10-CM | POA: Diagnosis not present

## 2015-05-25 DIAGNOSIS — R062 Wheezing: Secondary | ICD-10-CM

## 2015-05-25 DIAGNOSIS — R05 Cough: Secondary | ICD-10-CM | POA: Diagnosis not present

## 2015-05-25 DIAGNOSIS — R059 Cough, unspecified: Secondary | ICD-10-CM

## 2015-05-25 MED ORDER — PREDNISONE 20 MG PO TABS: ORAL_TABLET | ORAL | Status: DC

## 2015-05-25 MED ORDER — METHYLPREDNISOLONE ACETATE 80 MG/ML IJ SUSP
80.0000 mg | Freq: Once | INTRAMUSCULAR | Status: AC
  Administered 2015-05-25: 80 mg via INTRAMUSCULAR

## 2015-05-25 MED ORDER — BENZONATATE 100 MG PO CAPS
100.0000 mg | ORAL_CAPSULE | Freq: Three times a day (TID) | ORAL | Status: DC | PRN
Start: 2015-05-25 — End: 2015-07-13

## 2015-05-25 MED ORDER — AMOXICILLIN-POT CLAVULANATE 875-125 MG PO TABS: 1.0000 | ORAL_TABLET | Freq: Two times a day (BID) | ORAL | Status: DC

## 2015-05-25 MED ORDER — CEFTRIAXONE SODIUM 1 G IJ SOLR
1.0000 g | Freq: Once | INTRAMUSCULAR | Status: AC
  Administered 2015-05-25: 1 g via INTRAMUSCULAR

## 2015-05-25 MED ORDER — FLUTICASONE PROPIONATE 50 MCG/ACT NA SUSP: 2.0000 | Freq: Every day | NASAL | Status: DC

## 2015-05-25 NOTE — Addendum Note (Signed)
Addended by: Tasia Catchings on: 05/25/2015 09:06 AM   Modules accepted: Orders

## 2015-05-25 NOTE — Progress Notes (Signed)
Pre visit review using our clinic review tool, if applicable. No additional management support is needed unless otherwise documented below in the visit note. 

## 2015-05-25 NOTE — Telephone Encounter (Signed)
Call pt in one week and remind him to get flu vaccine. Also look like due for pneuovaccine. woud you investigate his tdap and zostavax. In 2015 note states orders sent to his pharmacy. I think this was referring to zostavax. When he is over current illness get flu vaccine and pneumovax.

## 2015-05-25 NOTE — Patient Instructions (Addendum)
You appear to have both maxillary and frontal  sinus infection. I am prescribing augmentin  antibiotic for the infection. To help with the nasal congestion I prescribed flonase  nasal steroid. For your associated cough, I prescribed benzonatate  cough medicine.  I do think based on exam you would benefit from both depomedrol IM and Rocephin IM.  If your recent reported wheezing worsens then start 3 day taper prednisone.  Rest, hydrate, tylenol for fever.  Follow up in 7 days or as needed.

## 2015-05-25 NOTE — Progress Notes (Signed)
Subjective:    Patient ID: Patrick Morris, male    DOB: [birth year], 65 y.o.   MRN: [id number]  HPI  Pt in with sinus pressure, sinus pain, productive cough. Pt states cough for 2 wks. Sinus pressure and pain for 4 days. Pt at times feel feverish at night. Moderate to severe nasal congestion reported.   Pt is still smoker. Pack a day.  Pt has his advair inhaler.  Wife states coughing at night. With some wheeze.    Review of Systems  Constitutional: Negative for fever, chills and fatigue.  HENT: Positive for congestion, postnasal drip, rhinorrhea and sinus pressure. Negative for sore throat.        Early on felt pnd.  Respiratory: Positive for cough and wheezing. Negative for chest tightness and shortness of breath.        Wheezing some per wife. Cough worse at night.  Cardiovascular: Negative for chest pain and palpitations.  Gastrointestinal: Negative for abdominal pain.  Musculoskeletal: Negative for back pain.  Skin: Negative for pallor and rash.  Neurological: Negative for dizziness, weakness, numbness and headaches.  Hematological: Negative for adenopathy. Does not bruise/bleed easily.  Psychiatric/Behavioral: Negative for behavioral problems and confusion.    Past Medical History  Diagnosis Date  . COPD (chronic obstructive pulmonary disease) ([place])   . Sinus infection   . Ear infection   . Colitis   . Salmonella   . Cataract   . GERD (gastroesophageal reflux disease)   . BPH (benign prostatic hyperplasia)     his specialist is recommending surgery.    Social History   Social History  . Marital Status: Married    Spouse Name: N/A  . Number of Children: N/A  . Years of Education: N/A   Occupational History  . Not on file.   Social History Main Topics  . Smoking status: Current Every Day Smoker -- 1.00 packs/day  . Smokeless tobacco: Not on file  . Alcohol Use: Yes  . Drug Use: No  . Sexual Activity: Yes    Birth Control/ Protection: None   Other  Topics Concern  . Not on file   Social History Narrative    No past surgical history on file.  Family History  Problem Relation Age of Onset  . Hyperlipidemia Father   . Stroke Father   . Alcohol abuse Father   . Throat cancer Brother   . Lymphoma Brother     Allergies  Allergen Reactions  . Aspirin Other (See Comments)    Acid reflux and drooling    Current Outpatient Prescriptions on File Prior to Visit  Medication Sig Dispense Refill  . acetaminophen (TYLENOL) 500 MG tablet Take 1,000 mg by mouth every 6 (six) hours as needed. For pain    . Casanthranol-Docusate Sodium 30-100 MG CAPS Take 2 capsules by mouth daily as needed. For constipation.    . docusate sodium (COLACE) 100 MG capsule Take 1 capsule (100 mg total) by mouth every 12 (twelve) hours. 60 capsule 0  . doxazosin (CARDURA) 4 MG tablet Take 4 mg by mouth daily.    . finasteride (PROSCAR) 5 MG tablet Take 5 mg by mouth daily.    . Fluticasone-Salmeterol (ADVAIR) 250-50 MCG/DOSE AEPB Inhale 1 puff into the lungs once. 60 each 3  . pramoxine (PROCTOFOAM) 1 % foam Place rectally every 2 (two) hours as needed for hemorrhoids. 15 g 0  . Tdap (BOOSTRIX) 5-2.5-18.5 LF-MCG/0.5 injection Inject 0.5 mLs into the muscle once. 0.5 mL  0  . zoster vaccine live, PF, (ZOSTAVAX) 25366 UNT/0.65ML injection Inject 19,400 Units into the skin once. 1 each 0   No current facility-administered medications on file prior to visit.    BP 114/76 mmHg  Pulse 74  Temp(Src) 97.6 F (36.4 C) (Oral)  Ht 5' 7.5" (1.715 m)  Wt 176 lb 6.4 oz (80.015 kg)  BMI 27.20 kg/m2  SpO2 97%       Objective:   Physical Exam  General  Mental Status - Alert. General Appearance - Well groomed. Not in acute distress. But severe nasal congestion(no use of otc nasal sprays)  Skin Rashes- No Rashes.  HEENT Head- Normal. Ear Auditory Canal - Left- Normal. Right - Normal.Tympanic Membrane- Left- Normal. Right- Normal. Eye Sclera/Conjunctiva-  Left- Normal. Right- Normal. Nose & Sinuses Nasal Mucosa- Left-  Very Boggy and Congested. Right-  Very Boggy and  Congested.Bilateral maxillary and frontal sinus pressure. Mouth & Throat Lips: Upper Lip- Normal: no dryness, cracking, pallor, cyanosis, or vesicular eruption. Lower Lip-Normal: no dryness, cracking, pallor, cyanosis or vesicular eruption. Buccal Mucosa- Bilateral- No Aphthous ulcers. Oropharynx- No Discharge or Erythema. Tonsils: Characteristics- Bilateral- No Erythema or Congestion. Size/Enlargement- Bilateral- No enlargement. Discharge- bilateral-None.  Neck Neck- Supple. No Masses.   Chest and Lung Exam Auscultation: Breath Sounds:-Clear even and unlabored.  Cardiovascular Auscultation:Rythm- Regular, rate and rhythm. Murmurs & Other Heart Sounds:Ausculatation of the heart reveal- No Murmurs.  Lymphatic Head & Neck General Head & Neck Lymphatics: Bilateral: Description- No Localized lymphadenopathy.       Assessment & Plan:  You appear to have both maxillary and frontal  sinus infection. I am prescribing augmentin  antibiotic for the infection. To help with the nasal congestion I prescribed flonase  nasal steroid. For your associated cough, I prescribed benzonatate  cough medicine.  I do think based on exam you would benefit from both solumedrol IM and Rocephin IM.  If your recent reported wheezing worsens then start 3 day taper prednisone.  Rest, hydrate, tylenol for fever.  Follow up in 7 days or as needed.

## 2015-07-13 ENCOUNTER — Encounter: Payer: Self-pay | Admitting: Medical

## 2015-07-13 ENCOUNTER — Telehealth: Payer: Self-pay | Admitting: Medical

## 2015-07-13 ENCOUNTER — Ambulatory Visit (INDEPENDENT_AMBULATORY_CARE_PROVIDER_SITE_OTHER): Payer: Medicare Other | Admitting: Medical

## 2015-07-13 VITALS — BP 120/74 | HR 89 | Temp 98.4°F | Ht 67.5 in | Wt 175.0 lb

## 2015-07-13 DIAGNOSIS — J111 Influenza due to unidentified influenza virus with other respiratory manifestations: Secondary | ICD-10-CM

## 2015-07-13 DIAGNOSIS — R509 Fever, unspecified: Secondary | ICD-10-CM | POA: Diagnosis not present

## 2015-07-13 DIAGNOSIS — M25522 Pain in left elbow: Secondary | ICD-10-CM

## 2015-07-13 DIAGNOSIS — R062 Wheezing: Secondary | ICD-10-CM

## 2015-07-13 DIAGNOSIS — M79622 Pain in left upper arm: Secondary | ICD-10-CM | POA: Diagnosis not present

## 2015-07-13 DIAGNOSIS — J0100 Acute maxillary sinusitis, unspecified: Secondary | ICD-10-CM | POA: Diagnosis not present

## 2015-07-13 LAB — CBC WITH DIFFERENTIAL/PLATELET
BASOS PCT: 0.3 % (ref 0.0–3.0)
Basophils Absolute: 0 10*3/uL (ref 0.0–0.1)
EOS PCT: 1.5 % (ref 0.0–5.0)
Eosinophils Absolute: 0.1 10*3/uL (ref 0.0–0.7)
HEMATOCRIT: 40.9 % (ref 39.0–52.0)
HEMOGLOBIN: 13.7 g/dL (ref 13.0–17.0)
LYMPHS PCT: 18.8 % (ref 12.0–46.0)
Lymphs Abs: 1.2 10*3/uL (ref 0.7–4.0)
MCHC: 33.5 g/dL (ref 30.0–36.0)
MCV: 87.9 fl (ref 78.0–100.0)
Monocytes Absolute: 0.8 10*3/uL (ref 0.1–1.0)
Monocytes Relative: 12.7 % — ABNORMAL HIGH (ref 3.0–12.0)
Neutro Abs: 4.1 10*3/uL (ref 1.4–7.7)
Neutrophils Relative %: 66.7 % (ref 43.0–77.0)
Platelets: 262 10*3/uL (ref 150.0–400.0)
RBC: 4.65 Mil/uL (ref 4.22–5.81)
RDW: 13.3 % (ref 11.5–15.5)
WBC: 6.2 10*3/uL (ref 4.0–10.5)

## 2015-07-13 LAB — POCT INFLUENZA A/B
INFLUENZA A, POC: NEGATIVE
Influenza B, POC: NEGATIVE

## 2015-07-13 LAB — TROPONIN I: TNIDX: 0.01 ug/l (ref 0.00–0.06)

## 2015-07-13 MED ORDER — BENZONATATE 100 MG PO CAPS
100.0000 mg | ORAL_CAPSULE | Freq: Three times a day (TID) | ORAL | Status: DC | PRN
Start: 2015-07-13 — End: 2016-05-03

## 2015-07-13 MED ORDER — AZITHROMYCIN 250 MG PO TABS: ORAL_TABLET | ORAL | Status: DC

## 2015-07-13 MED ORDER — OSELTAMIVIR PHOSPHATE 75 MG PO CAPS: 75.0000 mg | ORAL_CAPSULE | Freq: Two times a day (BID) | ORAL | Status: DC

## 2015-07-13 MED ORDER — ALBUTEROL SULFATE HFA 108 (90 BASE) MCG/ACT IN AERS: 2.0000 | INHALATION_SPRAY | Freq: Four times a day (QID) | RESPIRATORY_TRACT | Status: DC | PRN

## 2015-07-13 NOTE — Progress Notes (Signed)
Pre visit review using our clinic review tool, if applicable. No additional management support is needed unless otherwise documented below in the visit note. 

## 2015-07-13 NOTE — Patient Instructions (Addendum)
You have recent flu type syndrome. Will rx tamiflu.  Also some sinus infection following flu illness. Rx azithromycin.   For cough benzonatate. Continue flonase.  For wheezing use advair. If you need can use albuterol q 4-6 hours as needed sob or wheezing. If needing albuterol every 6 hours notify us and would add oral prednisone.  For you mild occasional left arm pain will get ekg( none presently). If this pain were to increase when you have or any associated heart/chest  type symptoms then ED evaluaton.  Follow up in 10 days or as needed.  Want to see you when not ill so we can address health maintenance issues.

## 2015-07-13 NOTE — Progress Notes (Signed)
Subjective:    Patient ID: Patrick Morris, male    DOB: 1951/02/03, 65 y.o.   MRN: [id number]  HPI  Pt in feeling sick for about 3 weeks. Pt states sinus pressure for 3 wks. Just recently some chest congestion.  Pt states last couple of days body aches. Also some chills. Last night he woke up sweating.   Pt has some ear achiness. Pt is 1 ppd smoker for years.  Some wheezing recently.  Pt did mention occasonal left arm pain. Painon and off for 30 minutes. Random. Not on movment. No jaw pain.no chest pain. No associated cardiac type signs and symptoms. Last time left arm pain was yesterday. Not present now.   Review of Systems  Constitutional: Positive for chills and fatigue.  HENT: Positive for congestion and sinus pressure.   Respiratory: Positive for cough and wheezing. Negative for choking and shortness of breath.        Pt was coughing up some mucous earlier in illness.  Cardiovascular: Negative for chest pain and palpitations.  Gastrointestinal: Negative for abdominal pain, constipation and blood in stool.  Musculoskeletal:       Occasional mild transient left arm pain  Skin: Negative for rash.  Neurological: Negative for dizziness, seizures, syncope, weakness and headaches.  Hematological: Negative for adenopathy. Does not bruise/bleed easily.     Past Medical History  Diagnosis Date  . COPD (chronic obstructive pulmonary disease) ([place])   . Sinus infection   . Ear infection   . Colitis   . Salmonella   . Cataract   . GERD (gastroesophageal reflux disease)   . BPH (benign prostatic hyperplasia)     his specialist is recommending surgery.    Social History   Social History  . Marital Status: Married    Spouse Name: N/A  . Number of Children: N/A  . Years of Education: N/A   Occupational History  . Not on file.   Social History Main Topics  . Smoking status: Current Every Day Smoker -- 1.00 packs/day  . Smokeless tobacco: Not on file  . Alcohol Use: Yes    . Drug Use: No  . Sexual Activity: Yes    Birth Control/ Protection: None   Other Topics Concern  . Not on file   Social History Narrative    No past surgical history on file.  Family History  Problem Relation Age of Onset  . Hyperlipidemia Father   . Stroke Father   . Alcohol abuse Father   . Throat cancer Brother   . Lymphoma Brother     Allergies  Allergen Reactions  . Aspirin Other (See Comments)    Acid reflux and drooling    Current Outpatient Prescriptions on File Prior to Visit  Medication Sig Dispense Refill  . acetaminophen (TYLENOL) 500 MG tablet Take 1,000 mg by mouth every 6 (six) hours as needed. For pain    . doxazosin (CARDURA) 4 MG tablet Take 4 mg by mouth daily.    . finasteride (PROSCAR) 5 MG tablet Take 5 mg by mouth daily.    . fluticasone (FLONASE) 50 MCG/ACT nasal spray Place 2 sprays into both nostrils daily. 16 g 1  . Fluticasone-Salmeterol (ADVAIR) 250-50 MCG/DOSE AEPB Inhale 1 puff into the lungs once. 60 each 3  . pramoxine (PROCTOFOAM) 1 % foam Place rectally every 2 (two) hours as needed for hemorrhoids. 15 g 0  . predniSONE (DELTASONE) 20 MG tablet 1 tab po tid day 1, 1 tab po  bid day 2, then 1 tab po day 3. 6 tablet 0  . Tdap (BOOSTRIX) 5-2.5-18.5 LF-MCG/0.5 injection Inject 0.5 mLs into the muscle once. 0.5 mL 0  . zoster vaccine live, PF, (ZOSTAVAX) 54656 UNT/0.65ML injection Inject 19,400 Units into the skin once. 1 each 0   No current facility-administered medications on file prior to visit.    BP 120/74 mmHg  Pulse 89  Temp(Src) 98.4 F (36.9 C) (Oral)  Ht 5' 7.5" (1.715 m)  Wt 175 lb (79.379 kg)  BMI 26.99 kg/m2  SpO2 96%       Objective:   Physical Exam  General  Mental Status - Alert. General Appearance - Well groomed. Not in acute distress.  Skin Rashes- No Rashes.  HEENT Head- Normal. Ear Auditory Canal - Left- Normal. Right - Normal.Tympanic Membrane- Left- Normal. Right- Normal. Eye Sclera/Conjunctiva-  Left- Normal. Right- Normal. Nose & Sinuses Nasal Mucosa- Left-  Boggy and Congested. Right-  Boggy and Congested.Bilateral maxillary and frontal sinus pressure. Mouth & Throat Lips: Upper Lip- Normal: no dryness, cracking, pallor, cyanosis, or vesicular eruption. Lower Lip-Normal: no dryness, cracking, pallor, cyanosis or vesicular eruption. Buccal Mucosa- Bilateral- No Aphthous ulcers. Oropharynx- No Discharge or Erythema. Tonsils: Characteristics- Bilateral- No Erythema or Congestion. Size/Enlargement- Bilateral- No enlargement. Discharge- bilateral-None.  Neck Neck- Supple. No Masses.   Chest and Lung Exam Auscultation: Breath Sounds:-Clear even and unlabored.  Cardiovascular Auscultation:Rythm- Regular, rate and rhythm. Murmurs & Other Heart Sounds:Ausculatation of the heart reveal- No Murmurs.  Lymphatic Head & Neck General Head & Neck Lymphatics: Bilateral: Description- No Localized lymphadenopathy.  Lt shoulder- no pain on palpation or range of motion. Lt arm- no pain on palpaton or range of motion. No redness or warmth.       Assessment & Plan:  You have recent flu type syndrome. Will rx tamiflu.  Also some sinus infection following flu illness. Rx azithromycin.   For cough benzonatate. Continue flonase.  For wheezing use advair. If you need can use albuterol q 4-6 hours as needed sob or wheezing. If needing albuterol every 6 hours notify us and would add oral prednisone.  For you mild occasional left arm pain will get ekg none presently). If this pain were to increase when you have or any associated heart/chest  type symptoms then ED evaluaton.  Follow up in 10 days or as needed.  Want to see you when not ill so we can address health maintenance issues.  I did review today ekg. I thought it looked similar to previous one 2 years ago. avl t wave on recent ekg looks inverted. No present pain in arm.Some last night. I did ekg today and troponin was  negative.  When notify pt on labs will also ask him to get xray of humerus and elbow.

## 2015-07-13 NOTE — Telephone Encounter (Signed)
Xray of arm and elbow placed

## 2015-07-14 NOTE — Telephone Encounter (Signed)
Left message for patient Xray orders placed.

## 2015-08-23 ENCOUNTER — Ambulatory Visit (HOSPITAL_BASED_OUTPATIENT_CLINIC_OR_DEPARTMENT_OTHER)
Admission: RE | Admit: 2015-08-23 | Discharge: 2015-08-23 | Disposition: A | Discharge status: Home / Self Care | Payer: Medicare Other | Source: Ambulatory Visit | Attending: Medical | Admitting: Medical

## 2015-08-23 DIAGNOSIS — M25512 Pain in left shoulder: Secondary | ICD-10-CM | POA: Diagnosis present

## 2015-08-23 DIAGNOSIS — M25522 Pain in left elbow: Secondary | ICD-10-CM

## 2015-12-27 ENCOUNTER — Other Ambulatory Visit: Payer: Self-pay | Admitting: Medical

## 2016-05-03 ENCOUNTER — Ambulatory Visit (INDEPENDENT_AMBULATORY_CARE_PROVIDER_SITE_OTHER): Payer: Medicare Other | Admitting: Medical

## 2016-05-03 ENCOUNTER — Encounter: Payer: Self-pay | Admitting: Medical

## 2016-05-03 VITALS — BP 126/78 | HR 75 | Temp 98.1°F | Resp 18 | Ht 67.5 in | Wt 181.1 lb

## 2016-05-03 DIAGNOSIS — R21 Rash and other nonspecific skin eruption: Secondary | ICD-10-CM

## 2016-05-03 DIAGNOSIS — J0100 Acute maxillary sinusitis, unspecified: Secondary | ICD-10-CM

## 2016-05-03 DIAGNOSIS — J209 Acute bronchitis, unspecified: Secondary | ICD-10-CM | POA: Diagnosis not present

## 2016-05-03 MED ORDER — BENZONATATE 100 MG PO CAPS: 100.0000 mg | ORAL_CAPSULE | Freq: Three times a day (TID) | ORAL | 0 refills | Status: DC | PRN

## 2016-05-03 MED ORDER — FLUTICASONE PROPIONATE 50 MCG/ACT NA SUSP: 2.0000 | Freq: Every day | NASAL | 1 refills | Status: DC

## 2016-05-03 MED ORDER — PREDNISONE 10 MG PO TABS: ORAL_TABLET | ORAL | 0 refills | Status: DC

## 2016-05-03 MED ORDER — CLOTRIMAZOLE-BETAMETHASONE 1-0.05 % EX CREA: 1.0000 "application " | TOPICAL_CREAM | Freq: Two times a day (BID) | CUTANEOUS | 0 refills | Status: DC

## 2016-05-03 MED ORDER — DOXYCYCLINE HYCLATE 100 MG PO TABS: 100.0000 mg | ORAL_TABLET | Freq: Two times a day (BID) | ORAL | 0 refills | Status: DC

## 2016-05-03 MED ORDER — AMMONIUM LACTATE 12 % EX CREA: TOPICAL_CREAM | CUTANEOUS | 1 refills | Status: DC | PRN

## 2016-05-03 NOTE — Progress Notes (Signed)
Pre visit review using our clinic review tool, if applicable. No additional management support is needed unless otherwise documented below in the visit note/SLS  

## 2016-05-03 NOTE — Patient Instructions (Addendum)
You appear to have a sinus infection(early bronchitis as well). I am prescribing  doxycyline antibiotic for the infection. To help with the nasal congestion I prescribed  flonase nasal steroid. For your associated cough, I prescribed cough medicine benzonatate  For dry skin rx lac-hydrin.  For groin rash lotrisone  If skin itching of back persists then can add taper prednisone. Making available in event allergic reaction component.  Rest, hydrate, tylenol for fever.  Follow up in 7 days or as needed.

## 2016-05-03 NOTE — Progress Notes (Signed)
Subjective:    Patient ID: Patrick Morris, male    DOB: [birth year], 66 y.o.   MRN: [id number]  HPI   Pt in for some rash on his back, buttox and groin araea. Are is itching and burning for about 2 months. Pt states has been putting benadryl on the area. Pt not sure if skin feels dry. He has been using benadryl but still itches. On review no suscpicious exposures.  Also some head and chest congestion with some sinus pressure and productive cough. Symptoms for 3-4 days. No fevers, no chills or sweats. No myalgia.  Review of Systems  Constitutional: Negative for chills, fatigue and fever.  HENT: Positive for congestion, sinus pain and sinus pressure. Negative for sore throat and trouble swallowing.   Respiratory: Negative for chest tightness, shortness of breath and wheezing.   Cardiovascular: Negative for chest pain and palpitations.  Gastrointestinal: Negative for abdominal pain.  Musculoskeletal: Negative for back pain.  Skin: Positive for rash.       Groin rash that itches.  Neurological: Negative for headaches.  Hematological: Negative for adenopathy. Does not bruise/bleed easily.  Psychiatric/Behavioral: Negative for behavioral problems and confusion.    Past Medical History:  Diagnosis Date  . BPH (benign prostatic hyperplasia)    his specialist is recommending surgery.  . Cataract   . Colitis   . COPD (chronic obstructive pulmonary disease) ([place])   . Ear infection   . GERD (gastroesophageal reflux disease)   . Salmonella   . Sinus infection      Social History   Social History  . Marital status: Married    Spouse name: N/A  . Number of children: N/A  . Years of education: N/A   Occupational History  . Not on file.   Social History Main Topics  . Smoking status: Current Every Day Smoker    Packs/day: 1.00  . Smokeless tobacco: Not on file  . Alcohol use Yes  . Drug use: No  . Sexual activity: Yes    Birth control/ protection: None   Other Topics Concern    . Not on file   Social History Narrative  . No narrative on file    No past surgical history on file.  Family History  Problem Relation Age of Onset  . Hyperlipidemia Father   . Stroke Father   . Alcohol abuse Father   . Throat cancer Brother   . Lymphoma Brother     Allergies  Allergen Reactions  . Aspirin Other (See Comments)    Acid reflux and drooling    Current Outpatient Prescriptions on File Prior to Visit  Medication Sig Dispense Refill  . acetaminophen (TYLENOL) 500 MG tablet Take 1,000 mg by mouth every 6 (six) hours as needed. For pain    . ADVAIR DISKUS 250-50 MCG/DOSE AEPB INHALE ONE PUFF INTO THE LUNGS ONCE DAILY 60 each 3  . albuterol (PROVENTIL HFA;VENTOLIN HFA) 108 (90 Base) MCG/ACT inhaler Inhale 2 puffs into the lungs every 6 (six) hours as needed for wheezing or shortness of breath. 1 Inhaler 0  . doxazosin (CARDURA) 4 MG tablet Take 4 mg by mouth daily.    . finasteride (PROSCAR) 5 MG tablet Take 5 mg by mouth daily.    . fluticasone (FLONASE) 50 MCG/ACT nasal spray Place 2 sprays into both nostrils daily. 16 g 1  . pramoxine (PROCTOFOAM) 1 % foam Place rectally every 2 (two) hours as needed for hemorrhoids. 15 g 0  . zoster vaccine  live, PF, (ZOSTAVAX) 40086 UNT/0.65ML injection Inject 19,400 Units into the skin once. (Patient not taking: Reported on 05/03/2016) 1 each 0   No current facility-administered medications on file prior to visit.     BP 126/78 (BP Location: Right Arm, Patient Position: Sitting, Cuff Size: Large)   Pulse 75   Temp 98.1 F (36.7 C) (Oral)   Resp 18   Ht 5' 7.5" (1.715 m)   Wt 181 lb 2 oz (82.2 kg)   SpO2 99%   BMI 27.95 kg/m       Objective:   Physical Exam  General  Mental Status - Alert. General Appearance - Well groomed. Not in acute distress.  Skin Rashes- dry skin to his lower back and scattered  Diffuse symmetic appearance of inflammed follicles.  Genital exam moderate red rash both inguinal  regions.  HEENT Head- Normal. Ear Auditory Canal - Left- Normal. Right - Normal.Tympanic Membrane- Left- Normal. Right- Normal. Eye Sclera/Conjunctiva- Left- Normal. Right- Normal. Nose & Sinuses Nasal Mucosa- Left-  Boggy and Congested. Right-  Boggy and  Congested.Bilateral maxillary and frontal sinus pressure. Mouth & Throat Lips: Upper Lip- Normal: no dryness, cracking, pallor, cyanosis, or vesicular eruption. Lower Lip-Normal: no dryness, cracking, pallor, cyanosis or vesicular eruption. Buccal Mucosa- Bilateral- No Aphthous ulcers. Oropharynx- No Discharge or Erythema. Tonsils: Characteristics- Bilateral- No Erythema or Congestion. Size/Enlargement- Bilateral- No enlargement. Discharge- bilateral-None.  Neck Neck- Supple. No Masses.   Chest and Lung Exam Auscultation: Breath Sounds:-Clear even and unlabored.  Cardiovascular Auscultation:Rythm- Regular, rate and rhythm. Murmurs & Other Heart Sounds:Ausculatation of the heart reveal- No Murmurs.  Lymphatic Head & Neck General Head & Neck Lymphatics: Bilateral: Description- No Localized lymphadenopathy.       Assessment & Plan:  You appear to have a sinus infection(early bronchitis as well). I am prescribing  doxycyline antibiotic for the infection. To help with the nasal congestion I prescribed  flonase nasal steroid. For your associated cough, I prescribed cough medicine benzonatate  For dry skin rx lac-hydrin.  For groin rash lotrisone  If skin itching of back persists then can add taper prednisone. Making available in event allergic reaction component.  If skin itching of back persists then can add taper prednisone. Making available in event allergic reaction component.  Rest, hydrate, tylenol for fever.  Note doxy would help with follicultis that appears present.  Follow up in 7 days or as needed.  Seydina Holliman, Percell Miller, PA-C

## 2016-05-07 ENCOUNTER — Telehealth: Payer: Self-pay | Admitting: Medical

## 2016-05-07 MED ORDER — CLOTRIMAZOLE-BETAMETHASONE 1-0.05 % EX CREA: 1.0000 "application " | TOPICAL_CREAM | Freq: Two times a day (BID) | CUTANEOUS | 0 refills | Status: DC

## 2016-05-07 NOTE — Telephone Encounter (Signed)
I wote rx of lotrisone and sent to [facility]. I thought is was on their $4 list??. Did he try to get this through insurance. He could ask them if on $4 list. I will [facility] and see how expensive it is or fi covered?

## 2016-05-08 MED ORDER — NYSTATIN-TRIAMCINOLONE 100000-0.1 UNIT/GM-% EX OINT: 1.0000 "application " | TOPICAL_OINTMENT | Freq: Two times a day (BID) | CUTANEOUS | 1 refills | Status: DC

## 2016-05-08 MED FILL — CLOTRIMAZOLE-BETAMETHASONE: 1-0.05 | 15 days supply | Qty: 30 | Fill #0

## 2016-05-08 NOTE — Telephone Encounter (Signed)
Spoke with [facility] to inquire on cost: $21.70; patient informed Rx to [facility], understood & agreed & he will p/u tomorrow/SLS 01/08

## 2016-05-08 NOTE — Telephone Encounter (Signed)
Nystatin-triamcinolone ointment sent to [facility]. He can call our pharmacy see if lotrisone cheaper her or see if nystatin reasonable at [facility].

## 2016-05-08 NOTE — Telephone Encounter (Signed)
Received fax from [place] stating that Clotrim/Baet 1-0.05% cream is not covered by [employer]; cost $49.83, pt request alternative. Please Advise/SLS 01/08

## 2016-05-09 ENCOUNTER — Telehealth: Payer: Self-pay

## 2016-05-09 NOTE — Telephone Encounter (Signed)
PA initiated via Covermymeds; KEY: [id number]. Awaiting determination.

## 2016-05-09 NOTE — Telephone Encounter (Signed)
Received PA approval. Approved from 02/09/2016 through 05/09/2017. Approval notification letter sent for scanning.

## 2016-05-12 ENCOUNTER — Other Ambulatory Visit: Payer: Self-pay | Admitting: Medical

## 2016-05-15 NOTE — Telephone Encounter (Signed)
Benzonatate and doxycycline requests denied. Rxs were originally prescribed on 05/03/16. Pt will need follow up in office if symptoms persist. Note sent to pharmacy.

## 2016-08-23 ENCOUNTER — Ambulatory Visit (INDEPENDENT_AMBULATORY_CARE_PROVIDER_SITE_OTHER): Payer: Medicare Other | Admitting: Medical

## 2016-08-23 ENCOUNTER — Ambulatory Visit (HOSPITAL_BASED_OUTPATIENT_CLINIC_OR_DEPARTMENT_OTHER)
Admission: RE | Admit: 2016-08-23 | Discharge: 2016-08-23 | Disposition: A | Discharge status: Home / Self Care | Payer: Medicare Other | Source: Ambulatory Visit | Attending: Medical | Admitting: Medical

## 2016-08-23 ENCOUNTER — Telehealth: Payer: Self-pay | Admitting: Medical

## 2016-08-23 VITALS — BP 126/87 | HR 86 | Temp 98.2°F | Resp 16 | Ht 67.0 in | Wt 178.2 lb

## 2016-08-23 DIAGNOSIS — R079 Chest pain, unspecified: Secondary | ICD-10-CM | POA: Diagnosis not present

## 2016-08-23 DIAGNOSIS — R05 Cough: Secondary | ICD-10-CM | POA: Insufficient documentation

## 2016-08-23 DIAGNOSIS — J209 Acute bronchitis, unspecified: Secondary | ICD-10-CM | POA: Diagnosis not present

## 2016-08-23 DIAGNOSIS — J029 Acute pharyngitis, unspecified: Secondary | ICD-10-CM

## 2016-08-23 DIAGNOSIS — R062 Wheezing: Secondary | ICD-10-CM | POA: Diagnosis not present

## 2016-08-23 DIAGNOSIS — I700 Atherosclerosis of aorta: Secondary | ICD-10-CM | POA: Diagnosis not present

## 2016-08-23 DIAGNOSIS — E785 Hyperlipidemia, unspecified: Secondary | ICD-10-CM

## 2016-08-23 MED ORDER — FLUTICASONE PROPIONATE 50 MCG/ACT NA SUSP: 2.0000 | Freq: Every day | NASAL | 1 refills | Status: DC

## 2016-08-23 MED ORDER — DOXYCYCLINE HYCLATE 100 MG PO TABS: 100.0000 mg | ORAL_TABLET | Freq: Two times a day (BID) | ORAL | 0 refills | Status: DC

## 2016-08-23 MED ORDER — BENZONATATE 100 MG PO CAPS: 100.0000 mg | ORAL_CAPSULE | Freq: Three times a day (TID) | ORAL | 0 refills | Status: DC | PRN

## 2016-08-23 MED ORDER — PREDNISONE 10 MG PO TABS: ORAL_TABLET | ORAL | 0 refills | Status: DC

## 2016-08-23 NOTE — Progress Notes (Signed)
Pre visit review using our clinic review tool, if applicable. No additional management support is needed unless otherwise documented below in the visit note. 

## 2016-08-23 NOTE — Progress Notes (Signed)
Subjective:    Patient ID: Patrick Morris, male    DOB: 1950/06/06, 66 y.o.   MRN: [id number]  HPI   Pt in states he has been sick for 2 weeks.  He states he started with nasal congestion and cough(some sneezing and itchy eyes early on). Symptoms have not improved. Pt has sinus pain, throat pain and some chest congestion with occasional . He is bringing up mucous. Has some back pain in lower lung region bilaterally.   Some wheezing recently.  Mild st. Also some mild faint bodyaches for one week.  Review of Systems  Constitutional: Positive for diaphoresis and fatigue. Negative for fever.  HENT: Positive for congestion, sinus pain, sinus pressure and sore throat.   Respiratory: Positive for cough and wheezing. Negative for shortness of breath.   Cardiovascular: Negative for chest pain and palpitations.  Gastrointestinal: Negative for abdominal pain, diarrhea and nausea.  Musculoskeletal: Positive for back pain.  Neurological: Negative for dizziness, seizures, weakness and light-headedness.  Hematological: Negative for adenopathy. Does not bruise/bleed easily.    Past Medical History:  Diagnosis Date  . BPH (benign prostatic hyperplasia)    his specialist is recommending surgery.  . Cataract   . Colitis   . COPD (chronic obstructive pulmonary disease) ([place])   . Ear infection   . GERD (gastroesophageal reflux disease)   . Salmonella   . Sinus infection      Social History   Social History  . Marital status: Married    Spouse name: N/A  . Number of children: N/A  . Years of education: N/A   Occupational History  . Not on file.   Social History Main Topics  . Smoking status: Current Every Day Smoker    Packs/day: 1.00  . Smokeless tobacco: Not on file  . Alcohol use Yes  . Drug use: No  . Sexual activity: Yes    Birth control/ protection: None   Other Topics Concern  . Not on file   Social History Narrative  . No narrative on file    No past surgical  history on file.  Family History  Problem Relation Age of Onset  . Hyperlipidemia Father   . Stroke Father   . Alcohol abuse Father   . Throat cancer Brother   . Lymphoma Brother     Allergies  Allergen Reactions  . Aspirin Other (See Comments)    Acid reflux and drooling    Current Outpatient Prescriptions on File Prior to Visit  Medication Sig Dispense Refill  . acetaminophen (TYLENOL) 500 MG tablet Take 1,000 mg by mouth every 6 (six) hours as needed. For pain    . ADVAIR DISKUS 250-50 MCG/DOSE AEPB INHALE ONE PUFF INTO THE LUNGS ONCE DAILY 60 each 3  . albuterol (PROVENTIL HFA;VENTOLIN HFA) 108 (90 Base) MCG/ACT inhaler Inhale 2 puffs into the lungs every 6 (six) hours as needed for wheezing or shortness of breath. 1 Inhaler 0  . ammonium lactate (LAC-HYDRIN) 12 % cream Apply topically as needed for dry skin. Apply to area twice daily 140 g 1  . benzonatate (TESSALON) 100 MG capsule Take 1 capsule (100 mg total) by mouth 3 (three) times daily as needed for cough. 21 capsule 0  . clotrimazole-betamethasone (LOTRISONE) cream Apply 1 application topically 2 (two) times daily. 30 g 0  . doxazosin (CARDURA) 4 MG tablet Take 4 mg by mouth daily.    Marland Kitchen doxycycline (VIBRA-TABS) 100 MG tablet Take 1 tablet (100 mg total) by  mouth 2 (two) times daily. 20 tablet 0  . finasteride (PROSCAR) 5 MG tablet Take 5 mg by mouth daily.    . fluticasone (FLONASE) 50 MCG/ACT nasal spray Place 2 sprays into both nostrils daily. 16 g 1  . fluticasone (FLONASE) 50 MCG/ACT nasal spray Place 2 sprays into both nostrils daily. 16 g 1  . nystatin-triamcinolone ointment (MYCOLOG) Apply 1 application topically 2 (two) times daily. 30 g 1  . pramoxine (PROCTOFOAM) 1 % foam Place rectally every 2 (two) hours as needed for hemorrhoids. 15 g 0  . predniSONE (DELTASONE) 10 MG tablet 5 TAB PO DAY 1 4 TAB PO DAY 2 3 TAB PO DAY 3 2 TAB PO DAY 4 1 TAB PO DAY 5 15 tablet 0  . zoster vaccine live, PF, (ZOSTAVAX) 51700  UNT/0.65ML injection Inject 19,400 Units into the skin once. 1 each 0   No current facility-administered medications on file prior to visit.     BP 126/87 (BP Location: Left Arm, Patient Position: Sitting, Cuff Size: Normal)   Pulse 86   Temp 98.2 F (36.8 C) (Oral)   Resp 16   Ht '5\' 7"'$  (1.702 m)   Wt 178 lb 3.2 oz (80.8 kg)   SpO2 98%   BMI 27.91 kg/m       Objective:   Physical Exam  General  Mental Status - Alert. General Appearance - Well groomed. Not in acute distress.  Skin Rashes- No Rashes.  HEENT Head- Normal. Ear Auditory Canal - Left- Normal. Right - Normal.Tympanic Membrane- Left- Normal. Right- Normal. Eye Sclera/Conjunctiva- Left- Normal. Right- Normal. Nose & Sinuses Nasal Mucosa- Left-  Boggy and Congested. Right-  Boggy and  Congested.Bilateral faint  maxillary and faint  frontal sinus pressure. Mouth & Throat Lips: Upper Lip- Normal: no dryness, cracking, pallor, cyanosis, or vesicular eruption. Lower Lip-Normal: no dryness, cracking, pallor, cyanosis or vesicular eruption. Buccal Mucosa- Bilateral- No Aphthous ulcers. Oropharynx- No Discharge or Erythema. Tonsils: Characteristics- Bilateral-  Erythema and mild  Congestion. Size/Enlargement- Bilateral- No enlargement. Discharge- bilateral-None.  Neck Neck- Supple. No Masses.   Chest and Lung Exam Auscultation: Breath Sounds:- even and unlabored. But faint minimal expiratory wheeze.  Cardiovascular Auscultation:Rythm- Regular, rate and rhythm. Murmurs & Other Heart Sounds:Ausculatation of the heart reveal- No Murmurs.  Lymphatic Head & Neck General Head & Neck Lymphatics: Bilateral: Description- No Localized lymphadenopathy.       Assessment & Plan:  You appear to have bronchitis. Rest hydrate and tylenol for fever. I am prescribing cough medicine benzonatate, and doxycycline antibiotic. For your nasal congestion rx flonase  I do want to get cxr today and cbc.(evaluate for  pneumonia)  For wheezing use your advair and albuterol inhaler. Making prednisone available to use if needed for worsening wheezing.  Follow up in 7-10 days or as needed  Nissim Fleischer, Percell Miller, [employer]

## 2016-08-23 NOTE — Telephone Encounter (Signed)
Labs ordered. To be done fasting in 2 weeks.

## 2016-08-23 NOTE — Patient Instructions (Signed)
You appear to have bronchitis. Rest hydrate and tylenol for fever. I am prescribing cough medicine benzonatate, and doxycycline antibiotic. For your nasal congestion rx flonase  I do want to get cxr today and cbc.(evaluate for pneumonia)  For wheezing use your advair and albuterol inhaler. Making prednisone available to use if needed for worsening wheezing.  Follow up in 7-10 days or as needed

## 2016-08-24 LAB — CBC WITH DIFFERENTIAL/PLATELET
BASOS PCT: 1.3 % (ref 0.0–3.0)
Basophils Absolute: 0.1 10*3/uL (ref 0.0–0.1)
EOS PCT: 12.9 % — AB (ref 0.0–5.0)
Eosinophils Absolute: 1.1 10*3/uL — ABNORMAL HIGH (ref 0.0–0.7)
HCT: 42 % (ref 39.0–52.0)
Hemoglobin: 14.4 g/dL (ref 13.0–17.0)
LYMPHS ABS: 2.1 10*3/uL (ref 0.7–4.0)
Lymphocytes Relative: 23.7 % (ref 12.0–46.0)
MCHC: 34.2 g/dL (ref 30.0–36.0)
MCV: 89.2 fl (ref 78.0–100.0)
MONO ABS: 0.4 10*3/uL (ref 0.1–1.0)
Monocytes Relative: 4.6 % (ref 3.0–12.0)
NEUTROS PCT: 57.5 % (ref 43.0–77.0)
Neutro Abs: 5.1 10*3/uL (ref 1.4–7.7)
PLATELETS: 336 10*3/uL (ref 150.0–400.0)
RBC: 4.71 Mil/uL (ref 4.22–5.81)
RDW: 13.4 % (ref 11.5–15.5)
WBC: 8.8 10*3/uL (ref 4.0–10.5)

## 2016-08-25 ENCOUNTER — Telehealth: Payer: Self-pay | Admitting: Medical

## 2016-08-25 NOTE — Telephone Encounter (Signed)
Pt said he will call back Monday to schedule lab appointment.

## 2016-08-25 NOTE — Telephone Encounter (Signed)
°  Relation to EZ:BMZT Call back number:[phone] Pharmacy:  Reason for call:  Patient inquiring about lab/imaging results

## 2016-08-28 NOTE — Progress Notes (Deleted)
Pre visit review using our clinic review tool, if applicable. No additional management support is needed unless otherwise documented below in the visit note. 

## 2016-08-28 NOTE — Progress Notes (Deleted)
Subjective:   Patrick Morris is a 66 y.o. male who presents for Medicare Annual/Subsequent preventive examination.  Review of Systems:  No ROS.  Medicare Wellness Visit.   Sleep patterns:   Home Safety/Smoke Alarms:   Living environment; residence and Firearm Safety:  Seat Belt Safety/Bike Helmet: Wears seat belt.   Counseling:   Eye Exam-  Dental-  Male:   CCS-     PSA- No results found for: PSA      Objective:    Vitals: There were no vitals taken for this visit.  There is no height or weight on file to calculate BMI.  Tobacco History  Smoking Status  . Current Every Day Smoker  . Packs/day: 1.00  Smokeless Tobacco  . Not on file     Ready to quit: Not Answered Counseling given: Not Answered   Past Medical History:  Diagnosis Date  . BPH (benign prostatic hyperplasia)    his specialist is recommending surgery.  . Cataract   . Colitis   . COPD (chronic obstructive pulmonary disease) ([place])   . Ear infection   . GERD (gastroesophageal reflux disease)   . Salmonella   . Sinus infection    No past surgical history on file. Family History  Problem Relation Age of Onset  . Hyperlipidemia Father   . Stroke Father   . Alcohol abuse Father   . Throat cancer Brother   . Lymphoma Brother    History  Sexual Activity  . Sexual activity: Yes  . Birth control/ protection: None    Outpatient Encounter Prescriptions as of 08/29/2016  Medication Sig  . acetaminophen (TYLENOL) 500 MG tablet Take 1,000 mg by mouth every 6 (six) hours as needed. For pain  . ADVAIR DISKUS 250-50 MCG/DOSE AEPB INHALE ONE PUFF INTO THE LUNGS ONCE DAILY  . albuterol (PROVENTIL HFA;VENTOLIN HFA) 108 (90 Base) MCG/ACT inhaler Inhale 2 puffs into the lungs every 6 (six) hours as needed for wheezing or shortness of breath.  Marland Kitchen ammonium lactate (LAC-HYDRIN) 12 % cream Apply topically as needed for dry skin. Apply to area twice daily  . benzonatate (TESSALON) 100 MG capsule Take 1 capsule (100  mg total) by mouth 3 (three) times daily as needed for cough.  . clotrimazole-betamethasone (LOTRISONE) cream Apply 1 application topically 2 (two) times daily.  Marland Kitchen doxazosin (CARDURA) 4 MG tablet Take 4 mg by mouth daily.  Marland Kitchen doxycycline (VIBRA-TABS) 100 MG tablet Take 1 tablet (100 mg total) by mouth 2 (two) times daily. Can give caps or generic.  . finasteride (PROSCAR) 5 MG tablet Take 5 mg by mouth daily.  . fluticasone (FLONASE) 50 MCG/ACT nasal spray Place 2 sprays into both nostrils daily.  . fluticasone (FLONASE) 50 MCG/ACT nasal spray Place 2 sprays into both nostrils daily.  . fluticasone (FLONASE) 50 MCG/ACT nasal spray Place 2 sprays into both nostrils daily.  Marland Kitchen nystatin-triamcinolone ointment (MYCOLOG) Apply 1 application topically 2 (two) times daily.  . pramoxine (PROCTOFOAM) 1 % foam Place rectally every 2 (two) hours as needed for hemorrhoids.  . predniSONE (DELTASONE) 10 MG tablet 5 TAB PO DAY 1 4 TAB PO DAY 2 3 TAB PO DAY 3 2 TAB PO DAY 4 1 TAB PO DAY 5  . zoster vaccine live, PF, (ZOSTAVAX) 32951 UNT/0.65ML injection Inject 19,400 Units into the skin once.   No facility-administered encounter medications on file as of 08/29/2016.     Activities of Daily Living No flowsheet data found.  Patient Care Team:  Mackie Pai, PA-C as PCP - General (Physician Assistant)   Assessment:    Physical assessment deferred to PCP.  Exercise Activities and Dietary recommendations   Diet (meal preparation, eat out, water intake, caffeinated beverages, dairy products, fruits and vegetables): {Desc; diets:16563} Breakfast: Lunch:  Dinner:       Goals    None     Fall Risk Fall Risk  03/02/2015 02/24/2014 02/24/2014 01/22/2014  Falls in the past year? No No No No   Depression Screen PHQ 2/9 Scores 03/02/2015 02/24/2014 01/22/2014  PHQ - 2 Score 0 0 0    Cognitive Function        Immunization History  Administered Date(s) Administered  . Influenza,inj,Quad PF,36+ Mos  01/22/2014  . Pneumococcal Conjugate-13 01/25/2014   Screening Tests Health Maintenance  Topic Date Due  . Hepatitis C Screening  Feb 12, 1951  . TETANUS/TDAP  12/21/1967  . PNA vac Low Risk Adult (2 of 2 - PPSV23) 01/26/2015  . INFLUENZA VACCINE  11/29/2016  . COLONOSCOPY  02/25/2024      Plan:   ***  I have personally reviewed and noted the following in the patient's chart:   . Medical and social history . Use of alcohol, tobacco or illicit drugs  . Current medications and supplements . Functional ability and status . Nutritional status . Physical activity . Advanced directives . List of other physicians . Hospitalizations, surgeries, and ER visits in previous 12 months . Vitals . Screenings to include cognitive, depression, and falls . Referrals and appointments  In addition, I have reviewed and discussed with patient certain preventive protocols, quality metrics, and best practice recommendations. A written personalized care plan for preventive services as well as general preventive health recommendations were provided to patient.     Naaman Plummer [place], [place]  08/28/2016

## 2016-08-29 ENCOUNTER — Ambulatory Visit: Payer: Medicare Other | Admitting: *Deleted

## 2016-08-29 NOTE — Telephone Encounter (Signed)
Left pt a message to call back. 

## 2016-08-29 NOTE — Telephone Encounter (Signed)
Patient called back to get lab results. Please call back.

## 2016-08-30 ENCOUNTER — Other Ambulatory Visit: Payer: Self-pay | Admitting: Medical

## 2016-08-30 DIAGNOSIS — I739 Peripheral vascular disease, unspecified: Secondary | ICD-10-CM

## 2016-08-30 NOTE — Telephone Encounter (Signed)
Patient returning call.

## 2016-08-30 NOTE — Telephone Encounter (Signed)
Pt asked to call daughter about x-ray results and why he needed more labs done.  Spoke with pt's daughter about results. Pt's daughter voiced understanding. Pts's daughter stated she would explain to her father.

## 2016-09-02 MED ORDER — FLUTICASONE-SALMETEROL 250-50 MCG/DOSE IN AEPB: INHALATION_SPRAY | RESPIRATORY_TRACT | 2 refills | Status: DC

## 2016-09-02 NOTE — Telephone Encounter (Signed)
Advair rx refilled

## 2016-09-04 ENCOUNTER — Other Ambulatory Visit (INDEPENDENT_AMBULATORY_CARE_PROVIDER_SITE_OTHER): Payer: Medicare Other

## 2016-09-04 ENCOUNTER — Other Ambulatory Visit: Payer: Medicare Other

## 2016-09-04 DIAGNOSIS — E785 Hyperlipidemia, unspecified: Secondary | ICD-10-CM

## 2016-09-04 LAB — COMPREHENSIVE METABOLIC PANEL
ALBUMIN: 3.9 g/dL (ref 3.5–5.2)
ALT: 10 U/L (ref 0–53)
AST: 10 U/L (ref 0–37)
Alkaline Phosphatase: 74 U/L (ref 39–117)
BUN: 13 mg/dL (ref 6–23)
CHLORIDE: 105 meq/L (ref 96–112)
CO2: 27 meq/L (ref 19–32)
CREATININE: 0.69 mg/dL (ref 0.40–1.50)
Calcium: 9 mg/dL (ref 8.4–10.5)
GFR: 121.3 mL/min (ref >60.00)
GLUCOSE: 90 mg/dL (ref 70–99)
Potassium: 4 mEq/L (ref 3.5–5.1)
SODIUM: 136 meq/L (ref 135–145)
Total Bilirubin: 0.7 mg/dL (ref 0.2–1.2)
Total Protein: 6.7 g/dL (ref 6.0–8.3)

## 2016-09-04 LAB — LIPID PANEL
CHOL/HDL RATIO: 5
CHOLESTEROL: 150 mg/dL (ref 0–200)
HDL: 33 mg/dL — ABNORMAL LOW (ref >39.00)
LDL CALC: 106 mg/dL — AB (ref 0–99)
NonHDL: 116.98
Triglycerides: 56 mg/dL (ref 0.0–149.0)
VLDL: 11.2 mg/dL (ref 0.0–40.0)

## 2016-09-19 ENCOUNTER — Ambulatory Visit (INDEPENDENT_AMBULATORY_CARE_PROVIDER_SITE_OTHER): Payer: Medicare Other | Admitting: Family Medicine

## 2016-09-19 VITALS — BP 116/70 | HR 78 | Temp 98.4°F | Resp 16 | Ht 67.0 in | Wt 175.8 lb

## 2016-09-19 DIAGNOSIS — M79672 Pain in left foot: Secondary | ICD-10-CM

## 2016-09-19 DIAGNOSIS — M79605 Pain in left leg: Secondary | ICD-10-CM | POA: Diagnosis not present

## 2016-09-19 NOTE — Progress Notes (Addendum)
Subjective:  I acted as a Education administrator for Dr. Carollee Herter.  Patrick Morris, Patrick Morris   Patient ID: KEEDAN SAMPLE, male    DOB: 11-26-1950, 66 y.o.   MRN: [id number]  Chief Complaint  Patient presents with  . Fall  . Leg Pain    left leg    Fall  Incident onset: 2 weeks ago. The fall occurred while walking (caught foot on something and fell). Impact surface: rocks driveway. Point of impact: right hand and left leg. The pain is present in the left lower leg and left foot. The symptoms are aggravated by use of injured limb. Pertinent negatives include no fever, headaches, loss of consciousness, numbness, tingling or vomiting. He has tried rest (muscle rub ointment) for the symptoms. The treatment provided no relief.  Leg Pain   Incident onset: 2 weeks ago. The incident occurred at home. The injury mechanism was a fall. The pain is present in the left leg and left foot. Associated symptoms include muscle weakness. Pertinent negatives include no inability to bear weight, numbness or tingling. The symptoms are aggravated by movement, weight bearing and palpation. He has tried non-weight bearing and rest (muscle rub) for the symptoms. The treatment provided no relief.    Patient is in today he fell about 2 weeks ago and hurt his left leg.   Patient Care Team: Saguier, Iris Pert as PCP - General (Physician Assistant)   Past Medical History:  Diagnosis Date  . BPH (benign prostatic hyperplasia)    his specialist is recommending surgery.  . Cataract   . Colitis   . COPD (chronic obstructive pulmonary disease) ([place])   . Ear infection   . GERD (gastroesophageal reflux disease)   . Salmonella   . Sinus infection     No past surgical history on file.  Family History  Problem Relation Age of Onset  . Hyperlipidemia Father   . Stroke Father   . Alcohol abuse Father   . Throat cancer Brother   . Lymphoma Brother     Social History   Social History  . Marital status: Married    Spouse  name: N/A  . Number of children: N/A  . Years of education: N/A   Occupational History  . Not on file.   Social History Main Topics  . Smoking status: Current Every Day Smoker    Packs/day: 1.00  . Smokeless tobacco: Never Used  . Alcohol use Yes  . Drug use: No  . Sexual activity: Yes    Birth control/ protection: None   Other Topics Concern  . Not on file   Social History Narrative  . No narrative on file    Outpatient Medications Prior to Visit  Medication Sig Dispense Refill  . acetaminophen (TYLENOL) 500 MG tablet Take 1,000 mg by mouth every 6 (six) hours as needed. For pain    . albuterol (PROVENTIL HFA;VENTOLIN HFA) 108 (90 Base) MCG/ACT inhaler Inhale 2 puffs into the lungs every 6 (six) hours as needed for wheezing or shortness of breath. 1 Inhaler 0  . ammonium lactate (LAC-HYDRIN) 12 % cream Apply topically as needed for dry skin. Apply to area twice daily 140 g 1  . clotrimazole-betamethasone (LOTRISONE) cream Apply 1 application topically 2 (two) times daily. 30 g 0  . doxazosin (CARDURA) 4 MG tablet Take 4 mg by mouth daily.    . finasteride (PROSCAR) 5 MG tablet Take 5 mg by mouth daily.    . fluticasone (FLONASE)  50 MCG/ACT nasal spray Place 2 sprays into both nostrils daily. 16 g 1  . Fluticasone-Salmeterol (ADVAIR DISKUS) 250-50 MCG/DOSE AEPB 1 inhalation twice daily. 1 each 2  . nystatin-triamcinolone ointment (MYCOLOG) Apply 1 application topically 2 (two) times daily. 30 g 1  . pramoxine (PROCTOFOAM) 1 % foam Place rectally every 2 (two) hours as needed for hemorrhoids. 15 g 0  . benzonatate (TESSALON) 100 MG capsule Take 1 capsule (100 mg total) by mouth 3 (three) times daily as needed for cough. 21 capsule 0  . doxycycline (VIBRA-TABS) 100 MG tablet Take 1 tablet (100 mg total) by mouth 2 (two) times daily. Can give caps or generic. 20 tablet 0  . fluticasone (FLONASE) 50 MCG/ACT nasal spray Place 2 sprays into both nostrils daily. 16 g 1  . fluticasone  (FLONASE) 50 MCG/ACT nasal spray Place 2 sprays into both nostrils daily. 16 g 1  . predniSONE (DELTASONE) 10 MG tablet 5 TAB PO DAY 1 4 TAB PO DAY 2 3 TAB PO DAY 3 2 TAB PO DAY 4 1 TAB PO DAY 5 15 tablet 0  . zoster vaccine live, PF, (ZOSTAVAX) 78938 UNT/0.65ML injection Inject 19,400 Units into the skin once. 1 each 0   No facility-administered medications prior to visit.     Allergies  Allergen Reactions  . Aspirin Other (See Comments)    Acid reflux and drooling    Review of Systems  Constitutional: Negative for fever and malaise/fatigue.  HENT: Negative for congestion.   Eyes: Negative for blurred vision.  Respiratory: Negative for cough and shortness of breath.   Cardiovascular: Negative for chest pain, palpitations and leg swelling.  Gastrointestinal: Negative for vomiting.  Musculoskeletal: Negative for back pain.  Skin: Negative for rash.  Neurological: Negative for tingling, loss of consciousness, numbness and headaches.       Objective:    Physical Exam  Constitutional: He appears well-developed and well-nourished. No distress.  HENT:  Head: Normocephalic and atraumatic.  Eyes: Conjunctivae are normal.  Neck: Normal range of motion. No thyromegaly present.  Cardiovascular: Normal rate and regular rhythm.   Pulmonary/Chest: Effort normal. He has no wheezes.  Abdominal: Soft. Bowel sounds are normal. There is no tenderness.  Musculoskeletal: He exhibits edema and tenderness. He exhibits no deformity.       Left ankle: He exhibits decreased range of motion, swelling and ecchymosis.       Left lower leg: He exhibits tenderness and edema.       Legs:      Left foot: There is tenderness.       Feet:  Neurological: He is alert.  Skin: Skin is warm and dry. He is not diaphoretic.  Psychiatric: He has a normal mood and affect.  Nursing note and vitals reviewed.   BP 116/70 (BP Location: Right Arm, Cuff Size: Normal)   Pulse 78   Temp 98.4 F (36.9 C) (Oral)    Resp 16   Ht '5\' 7"'$  (1.702 m)   Wt 175 lb 12.8 oz (79.7 kg)   SpO2 97%   BMI 27.53 kg/m  Wt Readings from Last 3 Encounters:  09/19/16 175 lb 12.8 oz (79.7 kg)  08/23/16 178 lb 3.2 oz (80.8 kg)  05/03/16 181 lb 2 oz (82.2 kg)   BP Readings from Last 3 Encounters:  09/19/16 116/70  08/23/16 126/87  05/03/16 126/78     Immunization History  Administered Date(s) Administered  . Influenza,inj,Quad PF,36+ Mos 01/22/2014  . Pneumococcal Conjugate-13 01/25/2014  Health Maintenance  Topic Date Due  . Hepatitis C Screening  May 25, 1950  . TETANUS/TDAP  12/21/1967  . PNA vac Low Risk Adult (2 of 2 - PPSV23) 01/26/2015  . INFLUENZA VACCINE  11/29/2016  . COLONOSCOPY  02/25/2024    Lab Results  Component Value Date   WBC 8.8 08/23/2016   HGB 14.4 08/23/2016   HCT 42.0 08/23/2016   PLT 336.0 08/23/2016   GLUCOSE 90 09/04/2016   CHOL 150 09/04/2016   TRIG 56.0 09/04/2016   HDL 33.00 (L) 09/04/2016   LDLCALC 106 (H) 09/04/2016   ALT 10 09/04/2016   AST 10 09/04/2016   NA 136 09/04/2016   K 4.0 09/04/2016   CL 105 09/04/2016   CREATININE 0.69 09/04/2016   BUN 13 09/04/2016   CO2 27 09/04/2016   INR 1.07 02/01/2012    No results found for: TSH Lab Results  Component Value Date   WBC 8.8 08/23/2016   HGB 14.4 08/23/2016   HCT 42.0 08/23/2016   MCV 89.2 08/23/2016   PLT 336.0 08/23/2016   Lab Results  Component Value Date   NA 136 09/04/2016   K 4.0 09/04/2016   CO2 27 09/04/2016   GLUCOSE 90 09/04/2016   BUN 13 09/04/2016   CREATININE 0.69 09/04/2016   BILITOT 0.7 09/04/2016   ALKPHOS 74 09/04/2016   AST 10 09/04/2016   ALT 10 09/04/2016   PROT 6.7 09/04/2016   ALBUMIN 3.9 09/04/2016   CALCIUM 9.0 09/04/2016   GFR 121.30 09/04/2016   Lab Results  Component Value Date   CHOL 150 09/04/2016   Lab Results  Component Value Date   HDL 33.00 (L) 09/04/2016   Lab Results  Component Value Date   LDLCALC 106 (H) 09/04/2016   Lab Results  Component  Value Date   TRIG 56.0 09/04/2016   Lab Results  Component Value Date   CHOLHDL 5 09/04/2016   No results found for: HGBA1C       Assessment & Plan:   Problem List Items Addressed This Visit      Unprioritized   Pain in left foot    Check xray to r/o fracture Post op shoe Ace Elevate , ice      Relevant Orders   DG Foot Complete Left (Completed)   US Venous Img Lower Unilateral Left (Completed)   Pain in left leg - Primary    [place] to r/o dvt Check xray      Relevant Orders   DG Tibia/Fibula Left (Completed)   US Venous Img Lower Unilateral Left (Completed)      I have discontinued Mr. Dowson zoster vaccine live (PF), doxycycline, benzonatate, and predniSONE. I am also having him maintain his acetaminophen, pramoxine, doxazosin, finasteride, albuterol, ammonium lactate, clotrimazole-betamethasone, nystatin-triamcinolone ointment, fluticasone, and Fluticasone-Salmeterol.  No orders of the defined types were placed in this encounter.   CMA served as Education administrator during this visit. History, Physical and Plan performed by medical provider. Documentation and orders reviewed and attested to.  Ann Held, DO

## 2016-09-19 NOTE — Patient Instructions (Signed)
Foot Sprain A foot sprain is an injury to one of the strong bands of tissue (ligaments) that connect and support the many bones in your feet. The ligament can be stretched too much or it can tear. A tear can be either partial or complete. The severity of the sprain depends on how much of the ligament was damaged or torn. What are the causes? A foot sprain is usually caused by suddenly twisting or pivoting your foot. What increases the risk? This injury is more likely to occur in people who:  Play a sport, such as basketball or football.  Exercise or play a sport without warming up.  Start a new workout or sport.  Suddenly increase how long or hard they exercise or play a sport. What are the signs or symptoms? Symptoms of this condition start soon after an injury and include:  Pain, especially in the arch of the foot.  Bruising.  Swelling.  Inability to walk or use the foot to support body weight. How is this diagnosed? This condition is diagnosed with a medical history and physical exam. You may also have imaging tests, such as:  X-rays to make sure there are no broken bones (fractures).  MRI to see if the ligament has torn. How is this treated? Treatment varies depending on the severity of your sprain. Mild sprains can be treated with rest, ice, compression, and elevation (RICE). If your ligament is overstretched or partially torn, treatment usually involves keeping your foot in a fixed position (immobilization) for a period of time. To help you do this, your health care provider will apply a bandage, splint, or walking boot to keep your foot from moving until it heals. You may also be advised to use crutches or a scooter for a few weeks to avoid bearing weight on your foot while it is healing. If your ligament is fully torn, you may need surgery to reconnect the ligament to the bone. After surgery, a cast or splint will be applied and will need to stay on your foot while it  heals. Your health care provider may also suggest exercises or physical therapy to strengthen your foot. Follow these instructions at home: If You Have a Bandage, Splint, or Walking Boot:   Wear it as directed by your health care provider. Remove it only as directed by your health care provider.  Loosen the bandage, splint, or walking boot if your toes become numb and tingle, or if they turn cold and blue. Bathing   If your health care provider approves bathing and showering, cover the bandage or splint with a watertight plastic bag to protect it from water. Do not let the bandage or splint get wet. Managing pain, stiffness, and swelling   If directed, apply ice to the injured area:  Put ice in a plastic bag.  Place a towel between your skin and the bag.  Leave the ice on for 20 minutes, 2-3 times per day.  Move your toes often to avoid stiffness and to lessen swelling.  Raise (elevate) the injured area above the level of your heart while you are sitting or lying down. Driving   Do not drive or operate heavy machinery while taking pain medicine.  Ask your health care provider when it is safe to drive if you have a bandage, splint, or walking boot on your foot. Activity   Rest as directed by your health care provider.  Do not use the injured foot to support your body weight until  your health care provider says that you can. Use crutches or other supportive devices as directed by your health care provider.  Ask your health care provider what activities are safe for you. Gradually increase how much and how far you walk until your health care provider says it is safe to return to full activity.  Do any exercise or physical therapy as directed by your health care provider. General instructions   If a splint was applied, do not put pressure on any part of it until it is fully hardened. This may take several hours.  Take medicines only as directed by your health care provider.  These include over-the-counter medicines and prescription medicines.  Keep all follow-up visits as directed by your health care provider. This is important.  When you can walk without pain, wear supportive shoes that have stiff soles. Do not wear flip-flops, and do not walk barefoot. Contact a health care provider if:  Your pain is not controlled with medicine.  Your bruising or swelling gets worse or does not get better with treatment.  Your splint or walking boot is damaged. Get help right away if:  You develop severe numbness or tingling in your foot.  Your foot turns blue, white, or gray, and it feels cold. This information is not intended to replace advice given to you by your health care provider. Make sure you discuss any questions you have with your health care provider. Document Released: 10/07/2001 Document Revised: 09/23/2015 Document Reviewed: 02/18/2014 Elsevier Interactive Patient Education  2017 [employer].

## 2016-09-20 ENCOUNTER — Ambulatory Visit (HOSPITAL_BASED_OUTPATIENT_CLINIC_OR_DEPARTMENT_OTHER)
Admission: RE | Admit: 2016-09-20 | Discharge: 2016-09-20 | Disposition: A | Discharge status: Home / Self Care | Payer: Medicare Other | Source: Ambulatory Visit | Attending: Family Medicine | Admitting: Family Medicine

## 2016-09-20 ENCOUNTER — Encounter: Payer: Self-pay | Admitting: Family Medicine

## 2016-09-20 DIAGNOSIS — M79605 Pain in left leg: Secondary | ICD-10-CM | POA: Insufficient documentation

## 2016-09-20 DIAGNOSIS — M79662 Pain in left lower leg: Secondary | ICD-10-CM | POA: Diagnosis not present

## 2016-09-20 DIAGNOSIS — N401 Enlarged prostate with lower urinary tract symptoms: Secondary | ICD-10-CM | POA: Diagnosis not present

## 2016-09-20 DIAGNOSIS — S99922A Unspecified injury of left foot, initial encounter: Secondary | ICD-10-CM | POA: Diagnosis not present

## 2016-09-20 DIAGNOSIS — M79672 Pain in left foot: Secondary | ICD-10-CM | POA: Insufficient documentation

## 2016-09-20 DIAGNOSIS — S9032XA Contusion of left foot, initial encounter: Secondary | ICD-10-CM | POA: Diagnosis not present

## 2016-09-20 DIAGNOSIS — N138 Other obstructive and reflux uropathy: Secondary | ICD-10-CM | POA: Diagnosis not present

## 2016-09-20 DIAGNOSIS — M7989 Other specified soft tissue disorders: Secondary | ICD-10-CM | POA: Diagnosis not present

## 2016-09-20 HISTORY — DX: Pain in left foot: M79.672

## 2016-09-20 HISTORY — DX: Pain in left leg: M79.605

## 2016-09-20 NOTE — Assessment & Plan Note (Signed)
[place] to r/o dvt Check xray

## 2016-09-20 NOTE — Assessment & Plan Note (Signed)
Check xray to r/o fracture Post op shoe Ace Elevate , ice

## 2016-09-20 NOTE — Telephone Encounter (Signed)
Plaque build up on his ultrasound of lower ext. I put in referral to vascular surgeon. Will you call pt and ask if he ever gets calf cramping when he walks.  Regarding the referral,  if he does not get call from speciailist within 2 weeks then ask pt to call here and ask to speak with Anderson Malta or kristi for referral update.

## 2016-09-22 ENCOUNTER — Telehealth: Payer: Self-pay | Admitting: *Deleted

## 2016-09-22 NOTE — Telephone Encounter (Signed)
AWV scheduled w/ [facility] Coach 10/02/16.

## 2016-09-26 NOTE — Telephone Encounter (Signed)
Left pt a message to call back. 

## 2016-09-27 ENCOUNTER — Telehealth: Payer: Self-pay | Admitting: Medical

## 2016-09-27 ENCOUNTER — Other Ambulatory Visit: Payer: Self-pay | Admitting: Vascular Surgery

## 2016-09-27 DIAGNOSIS — M79605 Pain in left leg: Secondary | ICD-10-CM

## 2016-09-27 NOTE — Telephone Encounter (Signed)
Caller name: Hassan Rowan  Relation to pt: Daughter Call back number: [phone] Pharmacy:  Reason for call: Daughter stating pt received call yesterday regarding about Jasmine needing information about pt. Please advise.

## 2016-09-29 NOTE — Progress Notes (Deleted)
Subjective:   Patrick Morris is a 66 y.o. male who presents for Medicare Annual/Subsequent preventive examination.  Review of Systems:  No ROS.  Medicare Wellness Visit.    Sleep patterns:  Home Safety/Smoke Alarms:   Living environment; residence and Firearm Safety: Seat Belt Safety/Bike Helmet: Wears seat belt.   Counseling:   Eye Exam-  Dental-  Male:   CCS-     PSA- No results found for: PSA      Objective:    Vitals: There were no vitals taken for this visit.  There is no height or weight on file to calculate BMI.  Tobacco History  Smoking Status  . Current Every Day Smoker  . Packs/day: 1.00  Smokeless Tobacco  . Never Used     Ready to quit: Not Answered Counseling given: Not Answered   Past Medical History:  Diagnosis Date  . BPH (benign prostatic hyperplasia)    his specialist is recommending surgery.  . Cataract   . Colitis   . COPD (chronic obstructive pulmonary disease) ([place])   . Ear infection   . GERD (gastroesophageal reflux disease)   . Salmonella   . Sinus infection    No past surgical history on file. Family History  Problem Relation Age of Onset  . Hyperlipidemia Father   . Stroke Father   . Alcohol abuse Father   . Throat cancer Brother   . Lymphoma Brother    History  Sexual Activity  . Sexual activity: Yes  . Birth control/ protection: None    Outpatient Encounter Prescriptions as of 10/02/2016  Medication Sig  . acetaminophen (TYLENOL) 500 MG tablet Take 1,000 mg by mouth every 6 (six) hours as needed. For pain  . albuterol (PROVENTIL HFA;VENTOLIN HFA) 108 (90 Base) MCG/ACT inhaler Inhale 2 puffs into the lungs every 6 (six) hours as needed for wheezing or shortness of breath.  Marland Kitchen ammonium lactate (LAC-HYDRIN) 12 % cream Apply topically as needed for dry skin. Apply to area twice daily  . clotrimazole-betamethasone (LOTRISONE) cream Apply 1 application topically 2 (two) times daily.  Marland Kitchen doxazosin (CARDURA) 4 MG tablet Take 4  mg by mouth daily.  . finasteride (PROSCAR) 5 MG tablet Take 5 mg by mouth daily.  . fluticasone (FLONASE) 50 MCG/ACT nasal spray Place 2 sprays into both nostrils daily.  . Fluticasone-Salmeterol (ADVAIR DISKUS) 250-50 MCG/DOSE AEPB 1 inhalation twice daily.  Marland Kitchen nystatin-triamcinolone ointment (MYCOLOG) Apply 1 application topically 2 (two) times daily.  . pramoxine (PROCTOFOAM) 1 % foam Place rectally every 2 (two) hours as needed for hemorrhoids.   No facility-administered encounter medications on file as of 10/02/2016.     Activities of Daily Living No flowsheet data found.  Patient Care Team: Saguier, Iris Pert as PCP - General (Physician Assistant)   Assessment:    Physical assessment deferred to PCP.  Exercise Activities and Dietary recommendations    Goals    None     Fall Risk Fall Risk  03/02/2015 02/24/2014 02/24/2014 01/22/2014  Falls in the past year? No No No No   Depression Screen PHQ 2/9 Scores 03/02/2015 02/24/2014 01/22/2014  PHQ - 2 Score 0 0 0    Cognitive Function        Immunization History  Administered Date(s) Administered  . Influenza,inj,Quad PF,36+ Mos 01/22/2014  . Pneumococcal Conjugate-13 01/25/2014   Screening Tests Health Maintenance  Topic Date Due  . Hepatitis C Screening  25-Nov-1950  . TETANUS/TDAP  12/21/1967  . PNA vac Low  Risk Adult (2 of 2 - PPSV23) 01/26/2015  . INFLUENZA VACCINE  11/29/2016  . COLONOSCOPY  02/25/2024      Plan:   ***  I have personally reviewed and noted the following in the patient's chart:   . Medical and social history . Use of alcohol, tobacco or illicit drugs  . Current medications and supplements . Functional ability and status . Nutritional status . Physical activity . Advanced directives . List of other physicians . Hospitalizations, surgeries, and ER visits in previous 12 months . Vitals . Screenings to include cognitive, depression, and falls . Referrals and appointments  In  addition, I have reviewed and discussed with patient certain preventive protocols, quality metrics, and best practice recommendations. A written personalized care plan for preventive services as well as general preventive health recommendations were provided to patient.     Shela Nevin, [place]  09/29/2016

## 2016-10-02 ENCOUNTER — Ambulatory Visit: Payer: Medicare Other | Admitting: *Deleted

## 2016-11-08 ENCOUNTER — Encounter: Payer: Self-pay | Admitting: Vascular Surgery

## 2016-11-16 ENCOUNTER — Encounter: Payer: Medicare Other | Admitting: Vascular Surgery

## 2016-11-16 ENCOUNTER — Encounter (HOSPITAL_COMMUNITY): Payer: Medicare Other

## 2017-02-22 ENCOUNTER — Ambulatory Visit: Payer: Medicare Other | Admitting: Medical

## 2017-02-22 DIAGNOSIS — Z0289 Encounter for other administrative examinations: Secondary | ICD-10-CM

## 2017-02-26 ENCOUNTER — Ambulatory Visit (HOSPITAL_BASED_OUTPATIENT_CLINIC_OR_DEPARTMENT_OTHER)
Admission: RE | Admit: 2017-02-26 | Discharge: 2017-02-26 | Disposition: A | Discharge status: Home / Self Care | Payer: Medicare Other | Source: Ambulatory Visit | Attending: Medical | Admitting: Medical

## 2017-02-26 ENCOUNTER — Encounter: Payer: Self-pay | Admitting: Medical

## 2017-02-26 ENCOUNTER — Ambulatory Visit (INDEPENDENT_AMBULATORY_CARE_PROVIDER_SITE_OTHER): Payer: Medicare Other | Admitting: Medical

## 2017-02-26 VITALS — BP 120/84 | HR 88 | Temp 98.2°F | Resp 16 | Ht 67.0 in | Wt 173.6 lb

## 2017-02-26 DIAGNOSIS — Z23 Encounter for immunization: Secondary | ICD-10-CM

## 2017-02-26 DIAGNOSIS — S42002A Fracture of unspecified part of left clavicle, initial encounter for closed fracture: Secondary | ICD-10-CM | POA: Diagnosis not present

## 2017-02-26 DIAGNOSIS — M25512 Pain in left shoulder: Secondary | ICD-10-CM | POA: Insufficient documentation

## 2017-02-26 DIAGNOSIS — Z87891 Personal history of nicotine dependence: Secondary | ICD-10-CM

## 2017-02-26 DIAGNOSIS — E785 Hyperlipidemia, unspecified: Secondary | ICD-10-CM

## 2017-02-26 DIAGNOSIS — M898X1 Other specified disorders of bone, shoulder: Secondary | ICD-10-CM | POA: Insufficient documentation

## 2017-02-26 DIAGNOSIS — J449 Chronic obstructive pulmonary disease, unspecified: Secondary | ICD-10-CM

## 2017-02-26 DIAGNOSIS — N400 Enlarged prostate without lower urinary tract symptoms: Secondary | ICD-10-CM | POA: Diagnosis not present

## 2017-02-26 DIAGNOSIS — X58XXXA Exposure to other specified factors, initial encounter: Secondary | ICD-10-CM | POA: Insufficient documentation

## 2017-02-26 DIAGNOSIS — M79622 Pain in left upper arm: Secondary | ICD-10-CM | POA: Diagnosis not present
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
## 2017-02-27 ENCOUNTER — Other Ambulatory Visit: Payer: Self-pay | Admitting: Medical

## 2017-03-02 ENCOUNTER — Other Ambulatory Visit: Payer: Self-pay | Admitting: Medical

## 2017-03-05 ENCOUNTER — Other Ambulatory Visit (INDEPENDENT_AMBULATORY_CARE_PROVIDER_SITE_OTHER): Payer: Medicare Other

## 2017-03-05 ENCOUNTER — Telehealth: Payer: Self-pay | Admitting: Medical

## 2017-03-05 DIAGNOSIS — E785 Hyperlipidemia, unspecified: Secondary | ICD-10-CM | POA: Diagnosis not present

## 2017-03-05 LAB — COMPREHENSIVE METABOLIC PANEL
ALBUMIN: 4 g/dL (ref 3.5–5.2)
ALT: 12 U/L (ref 0–53)
AST: 13 U/L (ref 0–37)
Alkaline Phosphatase: 106 U/L (ref 39–117)
BILIRUBIN TOTAL: 0.7 mg/dL (ref 0.2–1.2)
BUN: 10 mg/dL (ref 6–23)
CHLORIDE: 104 meq/L (ref 96–112)
CO2: 27 meq/L (ref 19–32)
CREATININE: 0.71 mg/dL (ref 0.40–1.50)
Calcium: 9.3 mg/dL (ref 8.4–10.5)
GFR: 117.19 mL/min (ref >60.00)
Glucose, Bld: 91 mg/dL (ref 70–99)
Potassium: 3.9 mEq/L (ref 3.5–5.1)
SODIUM: 136 meq/L (ref 135–145)
Total Protein: 7 g/dL (ref 6.0–8.3)

## 2017-03-05 LAB — LIPID PANEL
CHOL/HDL RATIO: 5
CHOLESTEROL: 124 mg/dL (ref 0–200)
HDL: 26.1 mg/dL — ABNORMAL LOW (ref >39.00)
LDL CALC: 86 mg/dL (ref 0–99)
NonHDL: 98.38
TRIGLYCERIDES: 63 mg/dL (ref 0.0–149.0)
VLDL: 12.6 mg/dL (ref 0.0–40.0)

## 2017-03-05 NOTE — Telephone Encounter (Signed)
Copied from [place] [id number]. Topic: Quick Communication - See Telephone Encounter >> Mar 05, 2017 10:16 AM Rosalin Hawking wrote: CRM for notification. See Telephone encounter for:   Pt tel  [phone] Pharmacy:  [facility] [place]  03/05/17. Needing refill ADVAIR DISKUS 250-50 MCG/DOSE AEPB. Please advise.

## 2017-03-05 NOTE — Telephone Encounter (Signed)
Patrick Morris, I believe this message was suppose to go to your inbox

## 2017-03-06 NOTE — Telephone Encounter (Signed)
Rx sent to pharmacy 03/02/17

## 2017-08-03 NOTE — Progress Notes (Addendum)
Subjective:   Patrick Morris is a 67 y.o. male who presents for Medicare Annual/Subsequent preventive examination. 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  Review of Systems: No ROS.  Medicare Wellness Visit. Additional risk factors are reflected in the social history.  Cardiac Risk Factors include: advanced age (>43men, >83 women);male gender Sleep patterns: Sleeps about 10 hrs.  Home Safety/Smoke Alarms: Feels safe in home. Smoke alarms in place.  Living environment; residence and Firearm Safety: Lives with wife and son in 1 story home.  Seat Belt Safety/Bike Helmet: Wears seat belt.   Male:   CCS- pt states last in 2014 with 10 yr recall.     PSA- No results found for: PSA      Objective:    Vitals: BP 122/70 (BP Location: Left Arm, Patient Position: Sitting, Cuff Size: Normal)   Pulse 74   Ht 5\' 7"  (1.702 m)   Wt 178 lb 9.6 oz (81 kg)   SpO2 98%   BMI 27.97 kg/m   Body mass index is 27.97 kg/m.  Advanced Directives 08/07/2017  Does Patient Have a Medical Advance Directive? No  Would patient like information on creating a medical advance directive? No - Patient declined    Tobacco Social History   Tobacco Use  Smoking Status Current Every Day Smoker  . Packs/day: 1.00  Smokeless Tobacco Never Used  Tobacco Comment   pt declines material     Ready to quit: No Counseling given: No Comment: pt declines material   Clinical Intake:  Pain : No/denies pain     Past Medical History:  Diagnosis Date  . BPH (benign prostatic hyperplasia)    his specialist is recommending surgery.  . Cataract   . Colitis   . COPD (chronic obstructive pulmonary disease) ([place])   . Ear infection   . GERD (gastroesophageal reflux disease)   . Salmonella   . Sinus infection    History reviewed. No pertinent surgical history. Family History  Problem Relation Age of Onset  . Hyperlipidemia Father   . Stroke Father   . Alcohol abuse Father   . Throat cancer  Brother   . Lymphoma Brother    Social History   Socioeconomic History  . Marital status: Married    Spouse name: Not on file  . Number of children: Not on file  . Years of education: Not on file  . Highest education level: Not on file  Occupational History  . Not on file  Social Needs  . Financial resource strain: Not on file  . Food insecurity:    Worry: Not on file    Inability: Not on file  . Transportation needs:    Medical: Not on file    Non-medical: Not on file  Tobacco Use  . Smoking status: Current Every Day Smoker    Packs/day: 1.00  . Smokeless tobacco: Never Used  . Tobacco comment: pt declines material  Substance and Sexual Activity  . Alcohol use: Never    Frequency: Never  . Drug use: No  . Sexual activity: Not Currently    Birth control/protection: None  Lifestyle  . Physical activity:    Days per week: Not on file    Minutes per session: Not on file  . Stress: Not on file  Relationships  . Social connections:    Talks on phone: Not on file    Gets together: Not on file    Attends religious service: Not on file  Active member of club or organization: Not on file    Attends meetings of clubs or organizations: Not on file    Relationship status: Not on file  Other Topics Concern  . Not on file  Social History Narrative  . Not on file    Outpatient Encounter Medications as of 08/07/2017  Medication Sig  . acetaminophen (TYLENOL) 500 MG tablet Take 1,000 mg by mouth every 6 (six) hours as needed. For pain  . ADVAIR DISKUS 250-50 MCG/DOSE AEPB INHALE 1 PUFF BY MOUTH TWICE DAILY  . ammonium lactate (LAC-HYDRIN) 12 % cream Apply topically as needed for dry skin. Apply to area twice daily  . clotrimazole-betamethasone (LOTRISONE) cream Apply 1 application topically 2 (two) times daily.  Marland Kitchen doxazosin (CARDURA) 4 MG tablet Take 4 mg by mouth daily.  . fluticasone (FLONASE) 50 MCG/ACT nasal spray Place 2 sprays into both nostrils daily.  Marland Kitchen  nystatin-triamcinolone ointment (MYCOLOG) Apply 1 application topically 2 (two) times daily.  . pramoxine (PROCTOFOAM) 1 % foam Place rectally every 2 (two) hours as needed for hemorrhoids.  Marland Kitchen albuterol (PROVENTIL HFA;VENTOLIN HFA) 108 (90 Base) MCG/ACT inhaler Inhale 2 puffs into the lungs every 6 (six) hours as needed for wheezing or shortness of breath. (Patient not taking: Reported on 08/07/2017)  . finasteride (PROSCAR) 5 MG tablet Take 5 mg by mouth daily.   No facility-administered encounter medications on file as of 08/07/2017.     Activities of Daily Living In your present state of health, do you have any difficulty performing the following activities: 08/07/2017  Hearing? N  Vision? N  Comment Hx cataract sx.  Difficulty concentrating or making decisions? N  Walking or climbing stairs? N  Dressing or bathing? N  Doing errands, shopping? N  Preparing Food and eating ? N  Using the Toilet? N  In the past six months, have you accidently leaked urine? N  Do you have problems with loss of bowel control? N  Managing your Medications? N  Managing your Finances? N  Housekeeping or managing your Housekeeping? N  Some recent data might be hidden    Patient Care Team: Saguier, Iris Pert as PCP - General (Physician Assistant)   Assessment:   This is a routine wellness examination for [place]. Physical assessment deferred to PCP.  Exercise Activities and Dietary recommendations Current Exercise Habits: The patient does not participate in regular exercise at present Diet (meal preparation, eat out, water intake, caffeinated beverages, dairy products, fruits and vegetables): high salt,  eats out. lots of fast food.      Goals    . Maintain current health       Fall Risk Fall Risk  08/07/2017 03/02/2015 02/24/2014 02/24/2014 01/22/2014  Falls in the past year? Yes No No No No  Number falls in past yr: 1 - - - -  Injury with Fall? Yes - - - -  Follow up Falls prevention  discussed;Education provided - - - -     Depression Screen PHQ 2/9 Scores 08/07/2017 02/26/2017 03/02/2015 02/24/2014  PHQ - 2 Score 0 2 0 0  PHQ- 9 Score - 2 - -    Cognitive Function MMSE - Mini Mental State Exam 08/07/2017  Orientation to time 5  Orientation to Place 5  Registration 3  Attention/ Calculation 4  Recall 2  Language- name 2 objects 2  Language- repeat 1  Language- follow 3 step command 3  Language- read & follow direction 1  Write a sentence 1  Copy design 1  Total score 28        Immunization History  Administered Date(s) Administered  . Influenza, High Dose Seasonal PF 02/26/2017  . Influenza,inj,Quad PF,6+ Mos 01/22/2014  . Pneumococcal Conjugate-13 01/25/2014  . Pneumococcal Polysaccharide-23 02/26/2017     Screening Tests Health Maintenance  Topic Date Due  . Hepatitis C Screening  09-23-1950  . TETANUS/TDAP  12/21/1967  . INFLUENZA VACCINE  11/29/2017  . COLONOSCOPY  02/25/2024  . PNA vac Low Risk Adult  Completed        Plan:   Follow up with PCP today as scheduled  Eat heart healthy diet (full of fruits, vegetables, whole grains, lean protein, water--limit salt, fat, and sugar intake) and increase physical activity as tolerated.  Do brain stimulating activities (puzzles, reading, adult coloring books, staying active) to keep memory sharp.    I have personally reviewed and noted the following in the patient's chart:   . Medical and social history . Use of alcohol, tobacco or illicit drugs  . Current medications and supplements . Functional ability and status . Nutritional status . Physical activity . Advanced directives . List of other physicians . Hospitalizations, surgeries, and ER visits in previous 12 months . Vitals . Screenings to include cognitive, depression, and falls . Referrals and appointments  In addition, I have reviewed and discussed with patient certain preventive protocols, quality metrics, and best practice  recommendations. A written personalized care plan for preventive services as well as general preventive health recommendations were provided to patient.     Shela Nevin, [place]  08/07/2017   Reviewed and agree with assessment and plan of RN.  Mackie Pai, PA-C

## 2017-08-06 ENCOUNTER — Ambulatory Visit: Payer: Medicare Other | Admitting: Medical

## 2017-08-07 ENCOUNTER — Encounter: Payer: Self-pay | Admitting: *Deleted

## 2017-08-07 ENCOUNTER — Encounter: Payer: Self-pay | Admitting: Medical

## 2017-08-07 ENCOUNTER — Ambulatory Visit (INDEPENDENT_AMBULATORY_CARE_PROVIDER_SITE_OTHER): Payer: Medicare Other | Admitting: *Deleted

## 2017-08-07 ENCOUNTER — Ambulatory Visit: Payer: Medicare Other | Admitting: *Deleted

## 2017-08-07 ENCOUNTER — Ambulatory Visit (INDEPENDENT_AMBULATORY_CARE_PROVIDER_SITE_OTHER): Payer: Medicare Other | Admitting: Medical

## 2017-08-07 VITALS — BP 122/70 | HR 74 | Ht 67.0 in | Wt 178.6 lb

## 2017-08-07 VITALS — BP 122/70 | Ht 67.0 in | Wt 178.0 lb

## 2017-08-07 DIAGNOSIS — Z87891 Personal history of nicotine dependence: Secondary | ICD-10-CM

## 2017-08-07 DIAGNOSIS — E786 Lipoprotein deficiency: Secondary | ICD-10-CM | POA: Diagnosis not present

## 2017-08-07 DIAGNOSIS — N400 Enlarged prostate without lower urinary tract symptoms: Secondary | ICD-10-CM

## 2017-08-07 DIAGNOSIS — J301 Allergic rhinitis due to pollen: Secondary | ICD-10-CM

## 2017-08-07 DIAGNOSIS — R739 Hyperglycemia, unspecified: Secondary | ICD-10-CM | POA: Diagnosis not present

## 2017-08-07 DIAGNOSIS — Z0000 Encounter for general adult medical examination without abnormal findings: Secondary | ICD-10-CM

## 2017-08-07 DIAGNOSIS — F17210 Nicotine dependence, cigarettes, uncomplicated: Secondary | ICD-10-CM | POA: Diagnosis not present

## 2017-08-07 DIAGNOSIS — J449 Chronic obstructive pulmonary disease, unspecified: Secondary | ICD-10-CM | POA: Diagnosis not present

## 2017-08-07 MED ORDER — AZELASTINE HCL 0.1 % NA SOLN: 2.0000 | Freq: Two times a day (BID) | NASAL | 12 refills | Status: DC

## 2017-08-07 NOTE — Patient Instructions (Addendum)
For history of smoking, do recommend you cut back or quit if possible. If you want to quit please let me know and I can still offer you medication to help you.  For COPD, please continue your current inhalers.  For history of BPH, I do want you to check and see if you are on Cardura or Proscar.  These are on your historical medication list but it appears you do not have active prescriptions. You are asymptomatic but want you to know if on medications.   For low hdl cholesterol and high sugars in past will get future lipid panel and cmp. Please schedule those to do fasting.  For allergies, I am prescribing Astelin in place of Flonase.  Please let me know if this still bothers your nose.  Follow-up date to be determined after lab review

## 2017-08-07 NOTE — Progress Notes (Signed)
Subjective:    Patient ID: Patrick Morris, male    DOB: 03-19-1951, 67 y.o.   MRN: [id number]  HPI   Pt in for follow up.  Pt is still smoking. He states about a pack a day. Pt had stable chronic lunge change xray but no nodules. Pt has been smoking since 67 yo. He pack a day for long time. Pt declined med for smoking cessation.(pt tried vaping in past but did not like).  Pt is using advair daily and he uses albuterol maybe 1-2 times every 2 weeks.  Pt states no urinary flow issues. He not sure if he is on cardura or proscar. These meds are only listed as historical.   Pt has low hdl cholesterol but other markers were good.  Pt has hx of allergies. Recent worse nasal congestin but flonase burns nostrils. So he want to try different spray.     Review of Systems  Constitutional: Negative for chills, diaphoresis, fatigue and fever.  HENT: Positive for congestion. Negative for drooling and ear pain.   Respiratory: Negative for cough, chest tightness, shortness of breath and wheezing.   Cardiovascular: Negative for chest pain and palpitations.  Gastrointestinal: Negative for abdominal distention, abdominal pain, blood in stool, diarrhea, nausea and vomiting.  Genitourinary: Negative for decreased urine volume, difficulty urinating, dysuria, frequency, hematuria, scrotal swelling and urgency.  Musculoskeletal: Negative for back pain.  Skin: Negative for rash.  Neurological: Negative for dizziness, facial asymmetry, speech difficulty, weakness, light-headedness and numbness.  Hematological: Negative for adenopathy. Does not bruise/bleed easily.  Psychiatric/Behavioral: Negative for behavioral problems and confusion. The patient is not nervous/anxious and is not hyperactive.    Past Medical History:  Diagnosis Date  . BPH (benign prostatic hyperplasia)    his specialist is recommending surgery.  . Cataract   . Colitis   . COPD (chronic obstructive pulmonary disease) ([place])   . Ear  infection   . GERD (gastroesophageal reflux disease)   . Salmonella   . Sinus infection      Social History   Socioeconomic History  . Marital status: Married    Spouse name: Not on file  . Number of children: Not on file  . Years of education: Not on file  . Highest education level: Not on file  Occupational History  . Not on file  Social Needs  . Financial resource strain: Not on file  . Food insecurity:    Worry: Not on file    Inability: Not on file  . Transportation needs:    Medical: Not on file    Non-medical: Not on file  Tobacco Use  . Smoking status: Current Every Day Smoker    Packs/day: 1.00  . Smokeless tobacco: Never Used  . Tobacco comment: pt declines material  Substance and Sexual Activity  . Alcohol use: Never    Frequency: Never  . Drug use: No  . Sexual activity: Not Currently    Birth control/protection: None  Lifestyle  . Physical activity:    Days per week: Not on file    Minutes per session: Not on file  . Stress: Not on file  Relationships  . Social connections:    Talks on phone: Not on file    Gets together: Not on file    Attends religious service: Not on file    Active member of club or organization: Not on file    Attends meetings of clubs or organizations: Not on file    Relationship status:  Not on file  . Intimate partner violence:    Fear of current or ex partner: Not on file    Emotionally abused: Not on file    Physically abused: Not on file    Forced sexual activity: Not on file  Other Topics Concern  . Not on file  Social History Narrative  . Not on file    No past surgical history on file.  Family History  Problem Relation Age of Onset  . Hyperlipidemia Father   . Stroke Father   . Alcohol abuse Father   . Throat cancer Brother   . Lymphoma Brother     Allergies  Allergen Reactions  . Aspirin Other (See Comments)    Acid reflux and drooling    Current Outpatient Medications on File Prior to Visit    Medication Sig Dispense Refill  . acetaminophen (TYLENOL) 500 MG tablet Take 1,000 mg by mouth every 6 (six) hours as needed. For pain    . ADVAIR DISKUS 250-50 MCG/DOSE AEPB INHALE 1 PUFF BY MOUTH TWICE DAILY 60 each 2  . albuterol (PROVENTIL HFA;VENTOLIN HFA) 108 (90 Base) MCG/ACT inhaler Inhale 2 puffs into the lungs every 6 (six) hours as needed for wheezing or shortness of breath. (Patient not taking: Reported on 08/07/2017) 1 Inhaler 0  . ammonium lactate (LAC-HYDRIN) 12 % cream Apply topically as needed for dry skin. Apply to area twice daily 140 g 1  . clotrimazole-betamethasone (LOTRISONE) cream Apply 1 application topically 2 (two) times daily. 30 g 0  . doxazosin (CARDURA) 4 MG tablet Take 4 mg by mouth daily.    . finasteride (PROSCAR) 5 MG tablet Take 5 mg by mouth daily.    . fluticasone (FLONASE) 50 MCG/ACT nasal spray Place 2 sprays into both nostrils daily. 16 g 1  . nystatin-triamcinolone ointment (MYCOLOG) Apply 1 application topically 2 (two) times daily. 30 g 1  . pramoxine (PROCTOFOAM) 1 % foam Place rectally every 2 (two) hours as needed for hemorrhoids. 15 g 0   No current facility-administered medications on file prior to visit.     There were no vitals taken for this visit.     Objective:   Physical Exam  General Mental Status- Alert. General Appearance- Not in acute distress.   Skin General: Color- Normal Color. Moisture- Normal Moisture.  Neck Carotid Arteries- Normal color. Moisture- Normal Moisture. No carotid bruits. No JVD.  Chest and Lung Exam Auscultation: Breath Sounds:-Normal.  Cardiovascular Auscultation:Rythm- Regular. Murmurs & Other Heart Sounds:Auscultation of the heart reveals- No Murmurs.  Neurologic Cranial Nerve exam:- CN III-XII intact(No nystagmus), symmetric smile. Strength:- 5/5 equal and symmetric strength both upper and lower extremities.     Assessment & Plan:  For history of smoking, do recommend you cut back or quit if  possible. If you want to quit please let me know and I can still offer you medication to help you.  For COPD, please continue your current inhalers.  For history of BPH, I do want you to check and see if you are on Cardura or Proscar.  These are on your historical medication list but it appears you do not have active prescriptions. You are asymptomatic but want you to know if on medications.   For low hdl cholesterol and high sugars in past will get future lipid panel and cmp. Please schedule those to do fasting.  For allergies, I am prescribing Astelin in place of Flonase.  Please let me know if this still bothers your  nose.  Follow-up date to be determined after lab review  Mackie Pai, PA-C

## 2017-08-07 NOTE — Patient Instructions (Signed)
Eat heart healthy diet (full of fruits, vegetables, whole grains, lean protein, water--limit salt, fat, and sugar intake) and increase physical activity as tolerated.  Do brain stimulating activities (puzzles, reading, adult coloring books, staying active) to keep memory sharp.    Patrick Morris , Thank you for taking time to come for your Medicare Wellness Visit. I appreciate your ongoing commitment to your health goals. Please review the following plan we discussed and let me know if I can assist you in the future.   These are the goals we discussed: Goals    . Maintain current health       This is a list of the screening recommended for you and due dates:  Health Maintenance  Topic Date Due  .  Hepatitis C: One time screening is recommended by Center for Disease Control  (CDC) for  adults born from [birth year] through 1967.   1950-10-01  . Tetanus Vaccine  12/21/1967  . Flu Shot  11/29/2017  . Colon Cancer Screening  02/25/2024  . Pneumonia vaccines  Completed    Health Maintenance, Male A healthy lifestyle and preventive care is important for your health and wellness. Ask your health care provider about what schedule of regular examinations is right for you. What should I know about weight and diet? Eat a Healthy Diet  Eat plenty of vegetables, fruits, whole grains, low-fat dairy products, and lean protein.  Do not eat a lot of foods high in solid fats, added sugars, or salt.  Maintain a Healthy Weight Regular exercise can help you achieve or maintain a healthy weight. You should:  Do at least 150 minutes of exercise each week. The exercise should increase your heart rate and make you sweat (moderate-intensity exercise).  Do strength-training exercises at least twice a week.  Watch Your Levels of Cholesterol and Blood Lipids  Have your blood tested for lipids and cholesterol every 5 years starting at 69 years of age. If you are at high risk for heart disease, you should start having  your blood tested when you are 69 years old. You may need to have your cholesterol levels checked more often if: ? Your lipid or cholesterol levels are high. ? You are older than 69 years of age. ? You are at high risk for heart disease.  What should I know about cancer screening? Many types of cancers can be detected early and may often be prevented. Lung Cancer  You should be screened every year for lung cancer if: ? You are a current smoker who has smoked for at least 30 years. ? You are a former smoker who has quit within the past 15 years.  Talk to your health care provider about your screening options, when you should start screening, and how often you should be screened.  Colorectal Cancer  Routine colorectal cancer screening usually begins at 69 years of age and should be repeated every 5-10 years until you are 69 years old. You may need to be screened more often if early forms of precancerous polyps or small growths are found. Your health care provider may recommend screening at an earlier age if you have risk factors for colon cancer.  Your health care provider may recommend using home test kits to check for hidden blood in the stool.  A small camera at the end of a tube can be used to examine your colon (sigmoidoscopy or colonoscopy). This checks for the earliest forms of colorectal cancer.  Prostate and Testicular Cancer  Depending on your age and overall health, your health care provider may do certain tests to screen for prostate and testicular cancer.  Talk to your health care provider about any symptoms or concerns you have about testicular or prostate cancer.  Skin Cancer  Check your skin from head to toe regularly.  Tell your health care provider about any new moles or changes in moles, especially if: ? There is a change in a mole's size, shape, or color. ? You have a mole that is larger than a pencil eraser.  Always use sunscreen. Apply sunscreen liberally and  repeat throughout the day.  Protect yourself by wearing long sleeves, pants, a wide-brimmed hat, and sunglasses when outside.  What should I know about heart disease, diabetes, and high blood pressure?  If you are 27-61 years of age, have your blood pressure checked every 3-5 years. If you are 22 years of age or older, have your blood pressure checked every year. You should have your blood pressure measured twice-once when you are at a hospital or clinic, and once when you are not at a hospital or clinic. Record the average of the two measurements. To check your blood pressure when you are not at a hospital or clinic, you can use: ? An automated blood pressure machine at a pharmacy. ? A home blood pressure monitor.  Talk to your health care provider about your target blood pressure.  If you are between 90-87 years old, ask your health care provider if you should take aspirin to prevent heart disease.  Have regular diabetes screenings by checking your fasting blood sugar level. ? If you are at a normal weight and have a low risk for diabetes, have this test once every three years after the age of 71. ? If you are overweight and have a high risk for diabetes, consider being tested at a younger age or more often.  A one-time screening for abdominal aortic aneurysm (AAA) by ultrasound is recommended for men aged 75-75 years who are current or former smokers. What should I know about preventing infection? Hepatitis B If you have a higher risk for hepatitis B, you should be screened for this virus. Talk with your health care provider to find out if you are at risk for hepatitis B infection. Hepatitis C Blood testing is recommended for:  Everyone born from [birth year] through 1967.  Anyone with known risk factors for hepatitis C.  Sexually Transmitted Diseases (STDs)  You should be screened each year for STDs including gonorrhea and chlamydia if: ? You are sexually active and are younger than 69  years of age. ? You are older than 69 years of age and your health care provider tells you that you are at risk for this type of infection. ? Your sexual activity has changed since you were last screened and you are at an increased risk for chlamydia or gonorrhea. Ask your health care provider if you are at risk.  Talk with your health care provider about whether you are at high risk of being infected with HIV. Your health care provider may recommend a prescription medicine to help prevent HIV infection.  What else can I do?  Schedule regular health, dental, and eye exams.  Stay current with your vaccines (immunizations).  Do not use any tobacco products, such as cigarettes, chewing tobacco, and e-cigarettes. If you need help quitting, ask your health care provider.  Limit alcohol intake to no more than 2 drinks per day. One drink  equals 12 ounces of beer, 5 ounces of wine, or 1 ounces of hard liquor.  Do not use street drugs.  Do not share needles.  Ask your health care provider for help if you need support or information about quitting drugs.  Tell your health care provider if you often feel depressed.  Tell your health care provider if you have ever been abused or do not feel safe at home. This information is not intended to replace advice given to you by your health care provider. Make sure you discuss any questions you have with your health care provider. Document Released: 10/14/2007 Document Revised: 12/15/2015 Document Reviewed: 01/19/2015 Elsevier Interactive Patient Education  [employer].

## 2017-09-01 ENCOUNTER — Other Ambulatory Visit: Payer: Self-pay | Admitting: Medical

## 2017-09-04 ENCOUNTER — Other Ambulatory Visit: Payer: Self-pay | Admitting: Emergency Medicine

## 2017-09-04 MED ORDER — FLUTICASONE-SALMETEROL 250-50 MCG/DOSE IN AEPB: 1.0000 | INHALATION_SPRAY | Freq: Two times a day (BID) | RESPIRATORY_TRACT | 2 refills | Status: DC

## 2017-09-05 ENCOUNTER — Telehealth: Payer: Self-pay

## 2017-09-05 NOTE — Telephone Encounter (Signed)
Copied from [place] [phone]. Topic: Referral - Request >> Sep 04, 2017  1:33 PM Bea Graff, NT wrote: Reason for CRM: Pts daughter calling and states her dad would like to be referred to a dermatologist. She would like him to go to the one he was referred to before for skin cancer spots.

## 2017-09-06 ENCOUNTER — Telehealth: Payer: Self-pay | Admitting: Medical

## 2017-09-06 DIAGNOSIS — Z1283 Encounter for screening for malignant neoplasm of skin: Secondary | ICD-10-CM

## 2017-09-06 NOTE — Telephone Encounter (Signed)
Refer to derm for skin cancer surveillance. Will you call patient and see who he saw last 5 years ago. He wants to see same practice. I put referral in but I don't see any note in media section.

## 2017-09-06 NOTE — Telephone Encounter (Signed)
Does pt know the name of dermatologist. I put in referral in 2015 to see one. Did he see someone else?  I will put in referral to see derm. But if he has specific name of MD he wants to see that would be helpful

## 2017-09-11 ENCOUNTER — Other Ambulatory Visit (INDEPENDENT_AMBULATORY_CARE_PROVIDER_SITE_OTHER): Payer: Medicare Other

## 2017-09-11 DIAGNOSIS — E786 Lipoprotein deficiency: Secondary | ICD-10-CM

## 2017-09-11 DIAGNOSIS — R739 Hyperglycemia, unspecified: Secondary | ICD-10-CM | POA: Diagnosis not present

## 2017-09-11 LAB — COMPREHENSIVE METABOLIC PANEL
ALK PHOS: 103 U/L (ref 39–117)
ALT: 8 U/L (ref 0–53)
AST: 9 U/L (ref 0–37)
Albumin: 3.9 g/dL (ref 3.5–5.2)
BILIRUBIN TOTAL: 0.4 mg/dL (ref 0.2–1.2)
BUN: 12 mg/dL (ref 6–23)
CO2: 27 mEq/L (ref 19–32)
CREATININE: 0.66 mg/dL (ref 0.40–1.50)
Calcium: 8.8 mg/dL (ref 8.4–10.5)
Chloride: 106 mEq/L (ref 96–112)
GFR: 127.3 mL/min (ref >60.00)
GLUCOSE: 92 mg/dL (ref 70–99)
Potassium: 3.7 mEq/L (ref 3.5–5.1)
Sodium: 140 mEq/L (ref 135–145)
TOTAL PROTEIN: 6.7 g/dL (ref 6.0–8.3)

## 2017-09-11 LAB — LIPID PANEL
CHOL/HDL RATIO: 4
Cholesterol: 112 mg/dL (ref 0–200)
HDL: 26.8 mg/dL — AB (ref >39.00)
LDL Cholesterol: 69 mg/dL (ref 0–99)
NONHDL: 85.08
TRIGLYCERIDES: 81 mg/dL (ref 0.0–149.0)
VLDL: 16.2 mg/dL (ref 0.0–40.0)

## 2017-11-16 ENCOUNTER — Other Ambulatory Visit: Payer: Self-pay | Admitting: Medical

## 2017-11-20 ENCOUNTER — Telehealth: Payer: Self-pay

## 2017-11-20 NOTE — Telephone Encounter (Signed)
Refilled of pt mycolog rx.

## 2017-11-20 NOTE — Telephone Encounter (Signed)
PA initiated via Covermymeds; KEY: [id number]. Received PA approval. Effective 08/22/2017 to 11/20/2018.

## 2018-01-11 DIAGNOSIS — N401 Enlarged prostate with lower urinary tract symptoms: Secondary | ICD-10-CM | POA: Diagnosis not present

## 2018-01-11 DIAGNOSIS — N138 Other obstructive and reflux uropathy: Secondary | ICD-10-CM | POA: Diagnosis not present

## 2018-01-29 ENCOUNTER — Ambulatory Visit (HOSPITAL_BASED_OUTPATIENT_CLINIC_OR_DEPARTMENT_OTHER)
Admission: RE | Admit: 2018-01-29 | Discharge: 2018-01-29 | Disposition: A | Discharge status: Home / Self Care | Payer: Medicare Other | Source: Ambulatory Visit | Attending: Medical | Admitting: Medical

## 2018-01-29 ENCOUNTER — Telehealth: Payer: Self-pay | Admitting: Medical

## 2018-01-29 ENCOUNTER — Ambulatory Visit (INDEPENDENT_AMBULATORY_CARE_PROVIDER_SITE_OTHER): Payer: Medicare Other | Admitting: Medical

## 2018-01-29 ENCOUNTER — Encounter: Payer: Self-pay | Admitting: Medical

## 2018-01-29 VITALS — BP 123/74 | HR 58 | Temp 97.8°F | Resp 16 | Ht 67.0 in | Wt 180.0 lb

## 2018-01-29 DIAGNOSIS — R059 Cough, unspecified: Secondary | ICD-10-CM

## 2018-01-29 DIAGNOSIS — R002 Palpitations: Secondary | ICD-10-CM

## 2018-01-29 DIAGNOSIS — R05 Cough: Secondary | ICD-10-CM

## 2018-01-29 DIAGNOSIS — Z122 Encounter for screening for malignant neoplasm of respiratory organs: Secondary | ICD-10-CM

## 2018-01-29 DIAGNOSIS — F17210 Nicotine dependence, cigarettes, uncomplicated: Secondary | ICD-10-CM | POA: Diagnosis not present

## 2018-01-29 DIAGNOSIS — J40 Bronchitis, not specified as acute or chronic: Secondary | ICD-10-CM | POA: Insufficient documentation

## 2018-01-29 DIAGNOSIS — F17200 Nicotine dependence, unspecified, uncomplicated: Secondary | ICD-10-CM | POA: Diagnosis not present

## 2018-01-29 DIAGNOSIS — J0100 Acute maxillary sinusitis, unspecified: Secondary | ICD-10-CM

## 2018-01-29 MED ORDER — DOXYCYCLINE HYCLATE 100 MG PO TABS: 100.0000 mg | ORAL_TABLET | Freq: Two times a day (BID) | ORAL | 0 refills | Status: DC

## 2018-01-29 MED ORDER — HYDROCODONE-HOMATROPINE 5-1.5 MG/5ML PO SYRP: 5.0000 mL | ORAL_SOLUTION | Freq: Four times a day (QID) | ORAL | 0 refills | Status: DC | PRN

## 2018-01-29 MED ORDER — FLUTICASONE PROPIONATE 50 MCG/ACT NA SUSP: 2.0000 | Freq: Every day | NASAL | 1 refills | Status: DC

## 2018-01-29 NOTE — Telephone Encounter (Signed)
CT chest order placed. Screening lung cancer.

## 2018-01-29 NOTE — Patient Instructions (Signed)
You appear to have bronchitis and sinusitis. Rest hydrate and tylenol for fever. I am prescribing cough medicine hycodan, and doxycycline antibiotic. For your nasal congestion. Rx Flonase.  For recent minimal wheezing, continue Advair and albuterol if needed.  If wheezing were to significantly worsen then would provide taper dose of prednisone.  Please get chest x-ray today.  Follow up in 7-10 days or as needed

## 2018-01-29 NOTE — Progress Notes (Signed)
Subjective:    Patient ID: Patrick Morris, male    DOB: 1951-01-30, 67 y.o.   MRN: [id number]  HPI  Pt states he has been coughing over last 4-5 weeks. He is coughing up some mucus. He has some chest congestion and sinus pressure.  Pt states sinus are tender and can blow out mucus as well.  Saturday he states he had brief palpitation sensation after severe repetitive cough episode that made him sob then he felt like heart raced briefly for about 2-5 minutes. No reoccurrence.Denies any recent chest pain.  Pt does smoke. Has smoked for more than 30 years. More than a pack a day. But at least since 67 years old.     Review of Systems  Constitutional: Negative for chills, fatigue and fever.  HENT: Positive for congestion, sinus pressure and sinus pain. Negative for sore throat.   Respiratory: Positive for cough. Negative for chest tightness, shortness of breath and wheezing.   Cardiovascular: Negative for chest pain and palpitations.  Gastrointestinal: Negative for abdominal pain.  Musculoskeletal: Negative for back pain.  Neurological: Negative for dizziness and light-headedness.  Hematological: Negative for adenopathy. Does not bruise/bleed easily.  Psychiatric/Behavioral: Negative for behavioral problems and confusion.   Past Medical History:  Diagnosis Date  . BPH (benign prostatic hyperplasia)    his specialist is recommending surgery.  . Cataract   . Colitis   . COPD (chronic obstructive pulmonary disease) ([place])   . Ear infection   . GERD (gastroesophageal reflux disease)   . Salmonella   . Sinus infection      Social History   Socioeconomic History  . Marital status: Married    Spouse name: Not on file  . Number of children: Not on file  . Years of education: Not on file  . Highest education level: Not on file  Occupational History  . Not on file  Social Needs  . Financial resource strain: Not on file  . Food insecurity:    Worry: Not on file    Inability:  Not on file  . Transportation needs:    Medical: Not on file    Non-medical: Not on file  Tobacco Use  . Smoking status: Current Every Day Smoker    Packs/day: 1.00  . Smokeless tobacco: Never Used  . Tobacco comment: pt declines material  Substance and Sexual Activity  . Alcohol use: Never    Frequency: Never  . Drug use: No  . Sexual activity: Not Currently    Birth control/protection: None  Lifestyle  . Physical activity:    Days per week: Not on file    Minutes per session: Not on file  . Stress: Not on file  Relationships  . Social connections:    Talks on phone: Not on file    Gets together: Not on file    Attends religious service: Not on file    Active member of club or organization: Not on file    Attends meetings of clubs or organizations: Not on file    Relationship status: Not on file  . Intimate partner violence:    Fear of current or ex partner: Not on file    Emotionally abused: Not on file    Physically abused: Not on file    Forced sexual activity: Not on file  Other Topics Concern  . Not on file  Social History Narrative  . Not on file    No past surgical history on file.  Family History  Problem Relation Age of Onset  . Hyperlipidemia Father   . Stroke Father   . Alcohol abuse Father   . Throat cancer Brother   . Lymphoma Brother     Allergies  Allergen Reactions  . Aspirin Other (See Comments)    Acid reflux and drooling    Current Outpatient Medications on File Prior to Visit  Medication Sig Dispense Refill  . acetaminophen (TYLENOL) 500 MG tablet Take 1,000 mg by mouth every 6 (six) hours as needed. For pain    . albuterol (PROVENTIL HFA;VENTOLIN HFA) 108 (90 Base) MCG/ACT inhaler Inhale 2 puffs into the lungs every 6 (six) hours as needed for wheezing or shortness of breath. 1 Inhaler 0  . ammonium lactate (LAC-HYDRIN) 12 % cream Apply topically as needed for dry skin. Apply to area twice daily 140 g 1  . azelastine (ASTELIN) 0.1 %  nasal spray Place 2 sprays into both nostrils 2 (two) times daily. Use in each nostril as directed 30 mL 12  . clotrimazole-betamethasone (LOTRISONE) cream Apply 1 application topically 2 (two) times daily. 30 g 0  . doxazosin (CARDURA) 4 MG tablet Take 4 mg by mouth daily.    . finasteride (PROSCAR) 5 MG tablet Take 5 mg by mouth daily.    . Fluticasone-Salmeterol (ADVAIR DISKUS) 250-50 MCG/DOSE AEPB Inhale 1 puff into the lungs 2 (two) times daily. 60 each 2  . nystatin-triamcinolone ointment (MYCOLOG) APPLY ONE APPLICATION TOPICALLY TO AFFECTED AREA TWICE DAILY 30 g 1   No current facility-administered medications on file prior to visit.     BP 123/74   Pulse (!) 58   Temp 97.8 F (36.6 C) (Oral)   Resp 16   Ht 5\' 7"  (1.702 m)   Wt 180 lb (81.6 kg)   SpO2 100%   BMI 28.19 kg/m       Objective:   Physical Exam  General  Mental Status - Alert. General Appearance - Well groomed. Not in acute distress.  Skin Rashes- No Rashes.  HEENT Head- Normal. Ear Auditory Canal - Left- Normal. Right - Normal.Tympanic Membrane- Left- Normal. Right- Normal. Eye Sclera/Conjunctiva- Left- Normal. Right- Normal. Nose & Sinuses Nasal Mucosa- Left-  Boggy and Congested. Right-  Boggy and  Congested.Bilateral maxillary and frontal sinus pressure. Mouth & Throat Lips: Upper Lip- Normal: no dryness, cracking, pallor, cyanosis, or vesicular eruption. Lower Lip-Normal: no dryness, cracking, pallor, cyanosis or vesicular eruption. Buccal Mucosa- Bilateral- No Aphthous ulcers. Oropharynx- No Discharge or Erythema. Tonsils: Characteristics- Bilateral- No Erythema or Congestion. Size/Enlargement- Bilateral- No enlargement. Discharge- bilateral-None.  Neck Neck- Supple. No Masses.   Chest and Lung Exam Auscultation: Breath Sounds:- even and unlabored but shallow bilaterally.  Cardiovascular Auscultation:Rythm- Regular, rate and rhythm. Murmurs & Other Heart Sounds:Ausculatation of the heart  reveal- No Murmurs.  Lymphatic Head & Neck General Head & Neck Lymphatics: Bilateral: Description- No Localized lymphadenopathy.       Assessment & Plan:   You appear to have bronchitis and sinusitis. Rest hydrate and tylenol for fever. I am prescribing cough medicine hycodan, and doxycycline antibiotic. For your nasal congestion. Rx Flonase.  For recent minimal wheezing, continue Advair and albuterol if needed.  If wheezing were to significantly worsen then would provide taper dose of prednisone.  Please get chest x-ray today.  Follow up in 7-10 days or as needed  [employer], [employer]

## 2018-02-10 ENCOUNTER — Other Ambulatory Visit: Payer: Self-pay | Admitting: Medical

## 2018-02-10 DIAGNOSIS — J40 Bronchitis, not specified as acute or chronic: Secondary | ICD-10-CM

## 2018-02-10 DIAGNOSIS — R05 Cough: Secondary | ICD-10-CM

## 2018-02-10 DIAGNOSIS — R059 Cough, unspecified: Secondary | ICD-10-CM

## 2018-02-19 ENCOUNTER — Ambulatory Visit (HOSPITAL_BASED_OUTPATIENT_CLINIC_OR_DEPARTMENT_OTHER)
Admission: RE | Admit: 2018-02-19 | Discharge: 2018-02-19 | Disposition: A | Discharge status: Home / Self Care | Payer: Medicare Other | Source: Ambulatory Visit | Attending: Medical | Admitting: Medical

## 2018-02-19 DIAGNOSIS — I2510 Atherosclerotic heart disease of native coronary artery without angina pectoris: Secondary | ICD-10-CM | POA: Diagnosis not present

## 2018-02-19 DIAGNOSIS — I700 Atherosclerosis of aorta: Secondary | ICD-10-CM | POA: Diagnosis not present

## 2018-02-19 DIAGNOSIS — R918 Other nonspecific abnormal finding of lung field: Secondary | ICD-10-CM | POA: Diagnosis not present

## 2018-02-19 DIAGNOSIS — K8020 Calculus of gallbladder without cholecystitis without obstruction: Secondary | ICD-10-CM | POA: Insufficient documentation

## 2018-02-19 DIAGNOSIS — J439 Emphysema, unspecified: Secondary | ICD-10-CM | POA: Diagnosis not present

## 2018-02-19 DIAGNOSIS — F17210 Nicotine dependence, cigarettes, uncomplicated: Secondary | ICD-10-CM | POA: Diagnosis not present

## 2018-02-19 DIAGNOSIS — R05 Cough: Secondary | ICD-10-CM | POA: Insufficient documentation

## 2018-02-19 DIAGNOSIS — R059 Cough, unspecified: Secondary | ICD-10-CM

## 2018-02-19 DIAGNOSIS — Z122 Encounter for screening for malignant neoplasm of respiratory organs: Secondary | ICD-10-CM

## 2018-02-20 ENCOUNTER — Telehealth: Payer: Self-pay | Admitting: Medical

## 2018-02-20 ENCOUNTER — Telehealth: Payer: Self-pay

## 2018-02-20 DIAGNOSIS — J984 Other disorders of lung: Secondary | ICD-10-CM

## 2018-02-20 DIAGNOSIS — R911 Solitary pulmonary nodule: Secondary | ICD-10-CM

## 2018-02-20 DIAGNOSIS — F17200 Nicotine dependence, unspecified, uncomplicated: Secondary | ICD-10-CM

## 2018-02-20 DIAGNOSIS — T8182XA Emphysema (subcutaneous) resulting from a procedure, initial encounter: Secondary | ICD-10-CM

## 2018-02-20 NOTE — Telephone Encounter (Signed)
[facility] radiology calling to alert PCP about CT chest results. Abnormal findings detailed in report; routed to [place] to advise.

## 2018-02-20 NOTE — Telephone Encounter (Signed)
Placed pulmologist referral.

## 2018-02-20 NOTE — Telephone Encounter (Signed)
Opened to review 

## 2018-02-21 NOTE — Telephone Encounter (Signed)
Would you notify pt that I am referring him to pulmonologist to evaluate moderate enlarged nodule. Radiologist not sure of significance and recommended the referral.

## 2018-02-22 NOTE — Telephone Encounter (Signed)
Left message to return call. [place] for [employer] to discuss.

## 2018-02-28 ENCOUNTER — Other Ambulatory Visit: Payer: Self-pay | Admitting: Medical

## 2018-03-19 ENCOUNTER — Ambulatory Visit (INDEPENDENT_AMBULATORY_CARE_PROVIDER_SITE_OTHER): Payer: Medicare Other | Admitting: Pulmonary Disease

## 2018-03-19 ENCOUNTER — Encounter: Payer: Self-pay | Admitting: Pulmonary Disease

## 2018-03-19 VITALS — BP 138/81 | HR 81 | Ht 67.0 in | Wt 170.0 lb

## 2018-03-19 DIAGNOSIS — J432 Centrilobular emphysema: Secondary | ICD-10-CM

## 2018-03-19 DIAGNOSIS — R911 Solitary pulmonary nodule: Secondary | ICD-10-CM

## 2018-03-19 DIAGNOSIS — Z23 Encounter for immunization: Secondary | ICD-10-CM | POA: Diagnosis not present

## 2018-03-19 DIAGNOSIS — F17210 Nicotine dependence, cigarettes, uncomplicated: Secondary | ICD-10-CM | POA: Diagnosis not present

## 2018-03-19 NOTE — Progress Notes (Signed)
Synopsis: Referred in November 2019 for a pulmonary nodule, emphysema.  Still smoking cigarettes as of November 2019.  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  Subjective:   PATIENT ID: Patrick Morris GENDER: male DOB: 11-06-1950, MRN: [id number]   HPI  Chief Complaint  Patient presents with  . Pulm Consult    Referred by Mackie Pai PA for a chronic cough and a nodule found a CT scan from last month. States his cough is productive with thick white mucus.     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  He comes to my clinic today because a CT scan of his chest showed a pulmonary nodule.  He says that he has had pneumonia in the past and has had several episodes of bronchitis over the years but not too many in the last year or so.  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  He says that he was told several years ago that he has COPD and emphysema because of his tobacco use and he has been taking Advair regularly since then.  He says that this does help.  He does not feel significantly limited by shortness of breath unless he performs "hard work".  Specifically what he means when he describes hard work is manual labor while working on his truck such as operating heavy tools or equipment.  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  He does cough from time to time.  He typically will only produce mucus when he gets sick.  He says that he has a lot of sinus symptoms and has had this for many years.  He was  referred to me because of a right upper lobe nodule seen on a screening CT scan.  Past Medical History:  Diagnosis Date  . BPH (benign prostatic hyperplasia)    his specialist is recommending surgery.  . Cataract   . Colitis   . COPD (chronic obstructive pulmonary disease) ([place])   . Ear infection   . GERD (gastroesophageal reflux disease)   . Salmonella   . Sinus infection      Family History  Problem Relation Age of Onset  . Hyperlipidemia Father   . Stroke Father   . Alcohol abuse Father   . Throat cancer Brother   . Lymphoma Brother      Social History   Socioeconomic History  . Marital status: Married    Spouse name: Not on file  . Number of children: Not on file  . Years of education: Not on file  . Highest education level: Not on file  Occupational History  . Not on file  Social Needs  . Financial resource strain: Not on file  . Food insecurity:    Worry: Not on file    Inability: Not on file  . Transportation needs:    Medical: Not on file    Non-medical: Not on file  Tobacco Use  . Smoking status: Current Every Day Smoker    Packs/day: 1.00  . Smokeless tobacco: Never Used  . Tobacco comment: pt declines material  Substance and Sexual Activity  . Alcohol use: Never    Frequency: Never  . Drug use: No  . Sexual activity: Not Currently    Birth control/protection: None  Lifestyle  . Physical activity:    Days per week: Not on file    Minutes per session: Not on file  . Stress: Not on file  Relationships  . Social connections:    Talks on phone: Not on file    Gets together: Not on file    Attends religious service: Not on file    Active member of club or organization: Not on file    Attends meetings of clubs or organizations: Not on file    Relationship status: Not on file  . Intimate partner violence:    Fear of current or ex partner: Not on file    Emotionally abused: Not on file    Physically abused: Not on file    Forced sexual activity:  Not on file  Other Topics Concern  . Not on file  Social History Narrative  . Not on file     Allergies  Allergen Reactions  . Aspirin Other (See Comments)    Acid reflux and drooling     Outpatient Medications Prior to Visit  Medication Sig Dispense Refill  . acetaminophen (TYLENOL) 500 MG tablet Take 1,000 mg by mouth every 6 (six) hours as needed. For pain    . ADVAIR DISKUS 250-50 MCG/DOSE AEPB INHALE 1 DOSE BY MOUTH TWICE DAILY 60 each 2  . albuterol (PROVENTIL HFA;VENTOLIN HFA) 108 (90 Base) MCG/ACT inhaler Inhale 2 puffs into the lungs every 6 (six) hours as needed for wheezing or shortness of breath. 1 Inhaler 0  . ammonium lactate (LAC-HYDRIN) 12 % cream Apply topically as needed for dry skin. Apply to area twice daily 140 g 1  . azelastine (ASTELIN) 0.1 % nasal spray Place 2 sprays into both nostrils 2 (two) times daily. Use in each nostril as directed 30 mL 12  . clotrimazole-betamethasone (LOTRISONE) cream Apply 1 application topically 2 (two) times daily. 30 g 0  . doxazosin (CARDURA) 4 MG tablet Take 4 mg by mouth daily.    . finasteride (PROSCAR) 5 MG tablet Take 5 mg by mouth daily.    . fluticasone (FLONASE) 50 MCG/ACT nasal spray Place 2 sprays into both nostrils daily. 16 g 1  . nystatin-triamcinolone ointment (MYCOLOG) APPLY ONE APPLICATION TOPICALLY TO AFFECTED AREA TWICE DAILY 30 g 1  . doxycycline (VIBRA-TABS) 100 MG tablet Take 1 tablet (100 mg total) by mouth 2 (two) times daily. Can give caps or generic 20 tablet 0  . HYDROcodone-homatropine (HYCODAN) 5-1.5 MG/5ML syrup Take 5 mLs by mouth every 6 (six) hours as needed. 100 mL 0   No facility-administered medications prior to visit.     Review of Systems  Constitutional: Negative for chills, fever, malaise/fatigue and weight loss.  HENT: Negative for congestion, nosebleeds, sinus pain and sore throat.   Eyes: Negative for photophobia, pain and discharge.  Respiratory: Positive for cough and shortness of  breath. Negative for hemoptysis, sputum production and wheezing.   Cardiovascular: Negative for chest pain, palpitations, orthopnea and leg swelling.  Gastrointestinal: Negative for abdominal pain, constipation, diarrhea, nausea and vomiting.  Genitourinary: Negative for dysuria, frequency, hematuria and urgency.  Musculoskeletal: Negative for back pain,  joint pain, myalgias and neck pain.  Skin: Negative for itching and rash.  Neurological: Negative for tingling, tremors, sensory change, speech change, focal weakness, seizures, weakness and headaches.  Psychiatric/Behavioral: Negative for memory loss, substance abuse and suicidal ideas. The patient is not nervous/anxious.       Objective:  Physical Exam   Vitals:   03/19/18 1328  BP: 138/81  Pulse: 81  SpO2: 97%  Weight: 170 lb (77.1 kg)  Height: 5\' 7"  (1.702 m)   RA  Gen: chronically ill appearing, no acute distress HENT: NCAT, OP clear, dentures, neck supple without masses Eyes: PERRL, EOMi Lymph: no cervical lymphadenopathy PULM: Poor air movement, no wheezing B CV: RRR, no mgr, no JVD GI: BS+, soft, nontender, no hsm Derm: no rash or skin breakdown MSK: normal bulk and tone Neuro: A&Ox4, CN II-XII intact, strength 5/5 in all 4 extremities Psyche: normal mood and affect   CBC    Component Value Date/Time   WBC 8.8 08/23/2016 1706   RBC 4.71 08/23/2016 1706   HGB 14.4 08/23/2016 1706   HCT 42.0 08/23/2016 1706   PLT 336.0 08/23/2016 1706   MCV 89.2 08/23/2016 1706   MCH 29.3 02/18/2012 1533   MCHC 34.2 08/23/2016 1706   RDW 13.4 08/23/2016 1706   LYMPHSABS 2.1 08/23/2016 1706   MONOABS 0.4 08/23/2016 1706   EOSABS 1.1 (H) 08/23/2016 1706   BASOSABS 0.1 08/23/2016 1706     Chest imaging: 01/2018 screening CT chest showed a right upper lobe nodule in the apex, some emphysema; images personally reviewed; non-specific finding in the T8 vertebral body  PFT:  Labs:  Path:  Echo:  Heart  Catheterization:       Assessment & Plan:   Pulmonary nodule  Centrilobular emphysema (HCC) - Plan: Spirometry with Graph  Cigarette smoker  Discussion: This is a pleasant 67 year old male who still smoking who has COPD and a nodule in the right upper lobe.  By my personal review of the right upper lobe nodule I think it is likely a scar related to prior infection because it is pleural-based, has an adjacent scar.  However, as a smoker with this lung nodule needs to be followed closely to make sure that it does not grow because he has a very high risk of lung cancer.  We will plan on performing a repeat CT scan of his chest in 3 months.  He has COPD but fortunately is not limited by his breathing nor does he have frequent exacerbations.  Today we talked extensively about how ongoing tobacco use will make this worsen and he should do his best to quit smoking right away.  Plan: Cigarette smoking: I recommend that you quit smoking right away 1-800-quit-now can help provide free nicotine replacement and counseling from the state of [place]  > 5 minutes talking about counseling and ways to consider quitting.  He says that he isn't sure if he could quit so we talked about various forms of nicotine replacement.   Pulmonary nodule: This is the medical term for the solid abnormality which was seen in your right lung.  As we discussed today I believe this is a scar but it needs to be followed very closely. We will repeat a CT scan of your chest in January 2020 and see you after that  COPD: Continue Advair Use albuterol as needed for chest tightness wheezing or shortness of breath Spirometry test today to see how severe COPD is Stay active Flu shot today  Practice good hand hygiene Your pneumonia vaccine is up-to-date  We will see you back in January 2020 or sooner if needed    Current Outpatient Medications:  .  acetaminophen (TYLENOL) 500 MG tablet, Take 1,000 mg by mouth  every 6 (six) hours as needed. For pain, Disp: , Rfl:  .  ADVAIR DISKUS 250-50 MCG/DOSE AEPB, INHALE 1 DOSE BY MOUTH TWICE DAILY, Disp: 60 each, Rfl: 2 .  albuterol (PROVENTIL HFA;VENTOLIN HFA) 108 (90 Base) MCG/ACT inhaler, Inhale 2 puffs into the lungs every 6 (six) hours as needed for wheezing or shortness of breath., Disp: 1 Inhaler, Rfl: 0 .  ammonium lactate (LAC-HYDRIN) 12 % cream, Apply topically as needed for dry skin. Apply to area twice daily, Disp: 140 g, Rfl: 1 .  azelastine (ASTELIN) 0.1 % nasal spray, Place 2 sprays into both nostrils 2 (two) times daily. Use in each nostril as directed, Disp: 30 mL, Rfl: 12 .  clotrimazole-betamethasone (LOTRISONE) cream, Apply 1 application topically 2 (two) times daily., Disp: 30 g, Rfl: 0 .  doxazosin (CARDURA) 4 MG tablet, Take 4 mg by mouth daily., Disp: , Rfl:  .  finasteride (PROSCAR) 5 MG tablet, Take 5 mg by mouth daily., Disp: , Rfl:  .  fluticasone (FLONASE) 50 MCG/ACT nasal spray, Place 2 sprays into both nostrils daily., Disp: 16 g, Rfl: 1 .  nystatin-triamcinolone ointment (MYCOLOG), APPLY ONE APPLICATION TOPICALLY TO AFFECTED AREA TWICE DAILY, Disp: 30 g, Rfl: 1

## 2018-03-19 NOTE — Patient Instructions (Signed)
Cigarette smoking: I recommend that you quit smoking right away 1-800-quit-now can help provide free nicotine replacement and counseling from the state of [place]  Pulmonary nodule: This is the medical term for the solid abnormality which was seen in your right lung.  As we discussed today I believe this is a scar but it needs to be followed very closely. We will repeat a CT scan of your chest in January 2020 and see you after that  COPD: Continue Advair Use albuterol as needed for chest tightness wheezing or shortness of breath Spirometry test today to see how severe COPD is Stay active Flu shot today Practice good hand hygiene Your pneumonia vaccine is up-to-date  We will see you back in January 2020 or sooner if needed

## 2018-03-20 NOTE — Addendum Note (Signed)
Addended by: Valerie Salts on: 03/20/2018 11:44 AM   Modules accepted: Orders

## 2018-03-25 ENCOUNTER — Ambulatory Visit (INDEPENDENT_AMBULATORY_CARE_PROVIDER_SITE_OTHER): Payer: Medicare Other | Admitting: Medical

## 2018-03-25 ENCOUNTER — Encounter: Payer: Self-pay | Admitting: Medical

## 2018-03-25 VITALS — BP 123/69 | HR 85 | Temp 98.2°F | Resp 16 | Ht 67.0 in | Wt 171.4 lb

## 2018-03-25 DIAGNOSIS — R110 Nausea: Secondary | ICD-10-CM | POA: Diagnosis not present

## 2018-03-25 DIAGNOSIS — R109 Unspecified abdominal pain: Secondary | ICD-10-CM

## 2018-03-25 DIAGNOSIS — R197 Diarrhea, unspecified: Secondary | ICD-10-CM | POA: Diagnosis not present

## 2018-03-25 DIAGNOSIS — R5383 Other fatigue: Secondary | ICD-10-CM

## 2018-03-25 MED ORDER — CIPROFLOXACIN HCL 500 MG PO TABS: 500.0000 mg | ORAL_TABLET | Freq: Two times a day (BID) | ORAL | 0 refills | Status: DC

## 2018-03-25 MED ORDER — ONDANSETRON 8 MG PO TBDP: 8.0000 mg | ORAL_TABLET | Freq: Three times a day (TID) | ORAL | 0 refills | Status: DC | PRN

## 2018-03-25 MED ORDER — FAMOTIDINE 20 MG PO TABS: 20.0000 mg | ORAL_TABLET | Freq: Two times a day (BID) | ORAL | 0 refills | Status: DC

## 2018-03-25 NOTE — Patient Instructions (Signed)
Your recent diarrhea may represent a viral versus bacterial illness.  5 days into illness and will assess current condition with a CBC, CMP, amylase, lipase and gastro panel.  Please turn in gastric panel tomorrow as the results will come back quicker the sooner you turn those in.  During the interim, please eat bland foods and make sure you hydrate well.  I would recommend propel fitness water or sugar-free Gatorade.  For diarrhea and use Imodium over-the-counter.  You report history of Shigella 1 time and if you have severe diarrhea as explained pending stool culture results then you could start Cipro.  I am providing that as a printed prescription but asked that you not use this unless stool culture positive or severe worsening symptoms pending culture result.  Zofran prescription given for nausea.  For upset stomach I did send in prescription of Pepcid.  Follow-up in 7 days or as needed.  If over Thanksgiving holidays your symptoms worsen or change severely then recommend ED evaluation.

## 2018-03-25 NOTE — Progress Notes (Signed)
   Subjective:    Patient ID: Patrick Morris, male    DOB: [birth year], 67 y.o.   MRN: [id number]  HPI  Pt in for some abdomen pain over past 4-5 days. Pt states pain after he eats. Loose stools most common after he eats. But also states even without eating will have loose. He states some cramps in stomach and having diarrhea/watery stools. He states one time in past he had salmonella yeas ago and felt kinda like this.  Pt not sure if he remember eating anything that upset his stomach.   Pt did have flu shot recently.   Pt had no antibiotics recently.        Review of Systems  Constitutional: Positive for fatigue. Negative for chills and fever.  Respiratory: Negative for cough, chest tightness, shortness of breath and wheezing.   Cardiovascular: Negative for chest pain and palpitations.  Gastrointestinal: Positive for diarrhea and nausea. Negative for abdominal distention, abdominal pain, constipation, rectal pain and vomiting.       Intermittent abdomen cramps.  Genitourinary: Negative for enuresis and frequency.  Musculoskeletal: Negative for back pain and gait problem.  Neurological: Negative for dizziness and numbness.  Hematological: Negative for adenopathy. Does not bruise/bleed easily.  Psychiatric/Behavioral: Negative for behavioral problems and confusion.       Objective:   Physical Exam  General Appearance- Not in acute distress.  HEENT Eyes- Scleraeral/Conjuntiva-bilat- Not Yellow. Mouth & Throat- Normal.  Chest and Lung Exam Auscultation: Breath sounds:-Normal. Adventitious sounds:- No Adventitious sounds.  Cardiovascular Auscultation:Rythm - Regular. Heart Sounds -Normal heart sounds.  Abdomen Inspection:-Inspection Normal.  Palpation/Perucssion: Palpation and Percussion of the abdomen reveal- Non Tender, No Rebound tenderness, No rigidity(Guarding) and No Palpable abdominal masses.  Liver:-Normal.  Spleen:- Normal.   Back- no cva  tenderness.   Skin- does not feel dry.      Assessment & Plan:  Your recent diarrhea may represent a viral versus bacterial illness.  5 days into illness and will assess current condition with a CBC, CMP, amylase, lipase and gastro panel.  Please turn in gastric panel tomorrow as the results will come back quicker the sooner you turn those in.  During the interim, please eat bland foods and make sure you hydrate well.  I would recommend propel fitness water or sugar-free Gatorade.  For diarrhea and use Imodium over-the-counter.  You report history of Shigella 1 time and if you have severe diarrhea as explained pending stool culture results then you could start Cipro.  I am providing that as a printed prescription but asked that you not use this unless stool culture positive or severe worsening symptoms pending culture result.  Zofran prescription given for nausea.  For upset stomach I did send in prescription of Pepcid.  Follow-up in 7 days or as needed.  If over Thanksgiving holidays your symptoms worsen or change severely then recommend ED evaluation.

## 2018-03-26 ENCOUNTER — Other Ambulatory Visit: Payer: Medicare Other

## 2018-03-26 DIAGNOSIS — R197 Diarrhea, unspecified: Secondary | ICD-10-CM

## 2018-03-26 LAB — COMPREHENSIVE METABOLIC PANEL
ALBUMIN: 3.7 g/dL (ref 3.5–5.2)
ALK PHOS: 103 U/L (ref 39–117)
ALT: 11 U/L (ref 0–53)
AST: 11 U/L (ref 0–37)
BUN: 10 mg/dL (ref 6–23)
CO2: 26 meq/L (ref 19–32)
Calcium: 8.7 mg/dL (ref 8.4–10.5)
Chloride: 105 mEq/L (ref 96–112)
Creatinine, Ser: 0.58 mg/dL (ref 0.40–1.50)
GFR: 147.54 mL/min (ref >60.00)
GLUCOSE: 98 mg/dL (ref 70–99)
POTASSIUM: 3.4 meq/L — AB (ref 3.5–5.1)
Sodium: 140 mEq/L (ref 135–145)
TOTAL PROTEIN: 6.3 g/dL (ref 6.0–8.3)
Total Bilirubin: 0.4 mg/dL (ref 0.2–1.2)

## 2018-03-26 LAB — CBC WITH DIFFERENTIAL/PLATELET
BASOS ABS: 0.1 10*3/uL (ref 0.0–0.1)
Basophils Relative: 0.8 % (ref 0.0–3.0)
EOS PCT: 4.6 % (ref 0.0–5.0)
Eosinophils Absolute: 0.5 10*3/uL (ref 0.0–0.7)
HCT: 39.9 % (ref 39.0–52.0)
HEMOGLOBIN: 13.6 g/dL (ref 13.0–17.0)
Lymphocytes Relative: 15.4 % (ref 12.0–46.0)
Lymphs Abs: 1.7 10*3/uL (ref 0.7–4.0)
MCHC: 34 g/dL (ref 30.0–36.0)
MCV: 90 fl (ref 78.0–100.0)
MONO ABS: 1.1 10*3/uL — AB (ref 0.1–1.0)
MONOS PCT: 10 % (ref 3.0–12.0)
Neutro Abs: 7.5 10*3/uL (ref 1.4–7.7)
Neutrophils Relative %: 69.2 % (ref 43.0–77.0)
Platelets: 354 10*3/uL (ref 150.0–400.0)
RBC: 4.43 Mil/uL (ref 4.22–5.81)
RDW: 13.1 % (ref 11.5–15.5)
WBC: 10.8 10*3/uL — AB (ref 4.0–10.5)

## 2018-03-26 LAB — LIPASE: Lipase: 13 U/L (ref 11.0–59.0)

## 2018-03-26 LAB — AMYLASE: Amylase: 29 U/L (ref 27–131)

## 2018-03-28 DIAGNOSIS — K219 Gastro-esophageal reflux disease without esophagitis: Secondary | ICD-10-CM | POA: Diagnosis not present

## 2018-03-28 DIAGNOSIS — N289 Disorder of kidney and ureter, unspecified: Secondary | ICD-10-CM | POA: Diagnosis not present

## 2018-03-28 DIAGNOSIS — Z79899 Other long term (current) drug therapy: Secondary | ICD-10-CM | POA: Diagnosis not present

## 2018-03-28 DIAGNOSIS — Z7951 Long term (current) use of inhaled steroids: Secondary | ICD-10-CM | POA: Diagnosis not present

## 2018-03-28 DIAGNOSIS — N2889 Other specified disorders of kidney and ureter: Secondary | ICD-10-CM | POA: Diagnosis not present

## 2018-03-28 DIAGNOSIS — F17210 Nicotine dependence, cigarettes, uncomplicated: Secondary | ICD-10-CM | POA: Diagnosis not present

## 2018-03-28 DIAGNOSIS — J449 Chronic obstructive pulmonary disease, unspecified: Secondary | ICD-10-CM | POA: Diagnosis not present

## 2018-03-28 DIAGNOSIS — R1084 Generalized abdominal pain: Secondary | ICD-10-CM | POA: Diagnosis not present

## 2018-03-28 DIAGNOSIS — E876 Hypokalemia: Secondary | ICD-10-CM | POA: Diagnosis not present

## 2018-03-28 DIAGNOSIS — K8020 Calculus of gallbladder without cholecystitis without obstruction: Secondary | ICD-10-CM | POA: Diagnosis not present

## 2018-03-28 DIAGNOSIS — K529 Noninfective gastroenteritis and colitis, unspecified: Secondary | ICD-10-CM | POA: Diagnosis not present

## 2018-03-28 DIAGNOSIS — Z888 Allergy status to other drugs, medicaments and biological substances status: Secondary | ICD-10-CM | POA: Diagnosis not present

## 2018-03-28 DIAGNOSIS — N281 Cyst of kidney, acquired: Secondary | ICD-10-CM | POA: Diagnosis not present

## 2018-03-28 MED ORDER — SODIUM CHLORIDE 0.9 % IV SOLN
10.00 | INTRAVENOUS | Status: DC
Start: ? — End: 2018-03-28

## 2018-03-28 MED ORDER — GENERIC EXTERNAL MEDICATION
10.00 | Status: DC
Start: ? — End: 2018-03-28

## 2018-03-29 LAB — GASTROINTESTINAL PATHOGEN PANEL PCR
C. DIFFICILE TOX A/B, PCR: DETECTED — AB
Campylobacter, PCR: NOT DETECTED
Cryptosporidium, PCR: NOT DETECTED
E COLI (ETEC) LT/ST, PCR: NOT DETECTED
E COLI (STEC) STX1/STX2, PCR: NOT DETECTED
E COLI 0157, PCR: NOT DETECTED
Giardia lamblia, PCR: NOT DETECTED
Norovirus, PCR: NOT DETECTED
Rotavirus A, PCR: NOT DETECTED
SALMONELLA, PCR: NOT DETECTED
Shigella, PCR: NOT DETECTED

## 2018-03-30 ENCOUNTER — Telehealth: Payer: Self-pay | Admitting: Medical

## 2018-03-30 MED ORDER — METRONIDAZOLE 500 MG PO TABS: 500.0000 mg | ORAL_TABLET | Freq: Three times a day (TID) | ORAL | 0 refills | Status: DC

## 2018-03-30 NOTE — Telephone Encounter (Signed)
Rx flagyl sent to pt pharmacy.

## 2018-04-10 DIAGNOSIS — N2889 Other specified disorders of kidney and ureter: Secondary | ICD-10-CM | POA: Diagnosis not present

## 2018-04-10 DIAGNOSIS — N400 Enlarged prostate without lower urinary tract symptoms: Secondary | ICD-10-CM | POA: Diagnosis not present

## 2018-04-10 DIAGNOSIS — N281 Cyst of kidney, acquired: Secondary | ICD-10-CM | POA: Diagnosis not present

## 2018-04-10 DIAGNOSIS — N289 Disorder of kidney and ureter, unspecified: Secondary | ICD-10-CM | POA: Diagnosis not present

## 2018-04-10 DIAGNOSIS — N323 Diverticulum of bladder: Secondary | ICD-10-CM | POA: Diagnosis not present

## 2018-04-18 ENCOUNTER — Ambulatory Visit: Payer: Self-pay | Admitting: *Deleted

## 2018-04-18 NOTE — Telephone Encounter (Signed)
I saw the patient called office earlier in the day.  I only saw this after hours.  Reviewed recent chart and ED visits.  He was diagnosed with colitis and has known gallstones.  I tried to call him this evening but no answer.  Checked my schedule for tomorrow and looks like I am completely full.  In light of upcoming weekend and upcoming holiday week, I think it is best for him  to go directly to the emergency department to get repeat stat work-up.  He will probably need CT scan or ultrasound.  This would be best in light of his known recent abdomen pain and medical history.  If you could call him tomorrow and communicate this to him.

## 2018-04-18 NOTE — Telephone Encounter (Signed)
Patient's wife is calling to report her husband is having abdominal pain. Patient is laying down and is helping to answer triage questions. Call to office- patient was seen in ED 03/28/18 and diagnosed with colitis, gallstones and C diff. Patient states the pain if very similar to the pain he had then.  They recommend to send note for provider review- advised wife will have provider review symptoms for treatment vs visit- she does request pain management. Advised her if he gets worse again- she should go ahead and take him to ED. She will await call.  Reason for Disposition . [1] SEVERE pain AND [2] present < 1 hour  Answer Assessment - Initial Assessment Questions 1. LOCATION: "Where does it hurt?"      Lower part of stomach- same area where patient had pain before 2. RADIATION: "Does the pain shoot anywhere else?" (e.g., chest, back)     Staying in same place 3. ONSET: "When did the pain begin?" (Minutes, hours or days ago)      Started yesterday 4. SUDDEN: "Gradual or sudden onset?"     Had been getting weaker- not feeling good- gradual onset 5. PATTERN "Does the pain come and go, or is it constant?"    - If constant: "Is it getting better, staying the same, or worsening?"      (Note: Constant means the pain never goes away completely; most serious pain is constant and it progresses)     - If intermittent: "How long does it last?" "Do you have pain now?"     (Note: Intermittent means the pain goes away completely between bouts)     Comes and goes- 45 minutes 6. SEVERITY: "How bad is the pain?"  (e.g., Scale 1-10; mild, moderate, or severe)    - MILD (1-3): doesn't interfere with normal activities, abdomen soft and not tender to touch     - MODERATE (4-7): interferes with normal activities or awakens from sleep, tender to touch     - SEVERE (8-10): excruciating pain, doubled over, unable to do any normal activities       severe 7. RECURRENT SYMPTOM: "Have you ever had this type of  abdominal pain before?" If so, ask: "When was the last time?" and "What happened that time?"      Very similar to colitis/ bacterial infection, antibiotic treatment  8. CAUSE: "What do you think is causing the abdominal pain?"     Not sure 9. RELIEVING/AGGRAVATING FACTORS: "What makes it better or worse?" (e.g., movement, antacids, bowel movement)     Diarrhea made it worse today 10. OTHER SYMPTOMS: "Has there been any vomiting, diarrhea, constipation, or urine problems?"       Nausea, diarrhea  Protocols used: ABDOMINAL PAIN - MALE-A-AH

## 2018-04-19 DIAGNOSIS — R011 Cardiac murmur, unspecified: Secondary | ICD-10-CM | POA: Diagnosis not present

## 2018-04-19 DIAGNOSIS — Z79899 Other long term (current) drug therapy: Secondary | ICD-10-CM | POA: Diagnosis not present

## 2018-04-19 DIAGNOSIS — N400 Enlarged prostate without lower urinary tract symptoms: Secondary | ICD-10-CM | POA: Diagnosis not present

## 2018-04-19 DIAGNOSIS — J449 Chronic obstructive pulmonary disease, unspecified: Secondary | ICD-10-CM | POA: Diagnosis not present

## 2018-04-19 DIAGNOSIS — N281 Cyst of kidney, acquired: Secondary | ICD-10-CM | POA: Diagnosis not present

## 2018-04-19 DIAGNOSIS — A0472 Enterocolitis due to Clostridium difficile, not specified as recurrent: Secondary | ICD-10-CM | POA: Diagnosis not present

## 2018-04-19 DIAGNOSIS — N2889 Other specified disorders of kidney and ureter: Secondary | ICD-10-CM | POA: Diagnosis not present

## 2018-04-19 DIAGNOSIS — K5100 Ulcerative (chronic) pancolitis without complications: Secondary | ICD-10-CM | POA: Diagnosis not present

## 2018-04-19 DIAGNOSIS — K8020 Calculus of gallbladder without cholecystitis without obstruction: Secondary | ICD-10-CM | POA: Diagnosis not present

## 2018-04-19 DIAGNOSIS — K529 Noninfective gastroenteritis and colitis, unspecified: Secondary | ICD-10-CM | POA: Diagnosis not present

## 2018-04-19 DIAGNOSIS — Z7982 Long term (current) use of aspirin: Secondary | ICD-10-CM | POA: Diagnosis not present

## 2018-04-19 DIAGNOSIS — Z7951 Long term (current) use of inhaled steroids: Secondary | ICD-10-CM | POA: Diagnosis not present

## 2018-04-19 DIAGNOSIS — R000 Tachycardia, unspecified: Secondary | ICD-10-CM | POA: Diagnosis not present

## 2018-04-19 DIAGNOSIS — K8081 Other cholelithiasis with obstruction: Secondary | ICD-10-CM | POA: Diagnosis not present

## 2018-04-19 DIAGNOSIS — K219 Gastro-esophageal reflux disease without esophagitis: Secondary | ICD-10-CM | POA: Diagnosis not present

## 2018-04-19 DIAGNOSIS — K7689 Other specified diseases of liver: Secondary | ICD-10-CM | POA: Diagnosis not present

## 2018-04-19 DIAGNOSIS — B351 Tinea unguium: Secondary | ICD-10-CM | POA: Diagnosis not present

## 2018-04-19 DIAGNOSIS — F17210 Nicotine dependence, cigarettes, uncomplicated: Secondary | ICD-10-CM | POA: Diagnosis not present

## 2018-04-19 DIAGNOSIS — K8021 Calculus of gallbladder without cholecystitis with obstruction: Secondary | ICD-10-CM | POA: Diagnosis not present

## 2018-04-19 NOTE — Telephone Encounter (Signed)
Patient was feeling worst earlier today and went to the hospital in [place], wife reports he was admited.

## 2018-04-19 NOTE — Telephone Encounter (Signed)
fyi

## 2018-04-20 DIAGNOSIS — K219 Gastro-esophageal reflux disease without esophagitis: Secondary | ICD-10-CM | POA: Diagnosis not present

## 2018-04-20 DIAGNOSIS — A0472 Enterocolitis due to Clostridium difficile, not specified as recurrent: Secondary | ICD-10-CM | POA: Diagnosis not present

## 2018-04-20 DIAGNOSIS — K8081 Other cholelithiasis with obstruction: Secondary | ICD-10-CM | POA: Diagnosis not present

## 2018-04-20 DIAGNOSIS — N400 Enlarged prostate without lower urinary tract symptoms: Secondary | ICD-10-CM | POA: Diagnosis not present

## 2018-04-20 DIAGNOSIS — J449 Chronic obstructive pulmonary disease, unspecified: Secondary | ICD-10-CM | POA: Diagnosis not present

## 2018-04-21 MED ORDER — NITROGLYCERIN 0.4 MG SL SUBL
0.40 | SUBLINGUAL_TABLET | SUBLINGUAL | Status: DC
Start: ? — End: 2018-04-21

## 2018-04-21 MED ORDER — CLONIDINE HCL 0.1 MG PO TABS
0.10 | ORAL_TABLET | ORAL | Status: DC
Start: ? — End: 2018-04-21

## 2018-04-21 MED ORDER — VANCOMYCIN HCL 125 MG PO CAPS
125.00 | ORAL_CAPSULE | ORAL | Status: DC
Start: 2018-04-20 — End: 2018-04-21

## 2018-04-21 MED ORDER — BUDESONIDE-FORMOTEROL FUMARATE 80-4.5 MCG/ACT IN AERO
2.00 | INHALATION_SPRAY | RESPIRATORY_TRACT | Status: DC
Start: 2018-04-20 — End: 2018-04-21

## 2018-04-21 MED ORDER — MORPHINE SULFATE (PF) 4 MG/ML IV SOLN
2.00 | INTRAVENOUS | Status: DC
Start: ? — End: 2018-04-21

## 2018-04-21 MED ORDER — POLYETHYLENE GLYCOL 3350 17 G PO PACK
17.00 | PACK | ORAL | Status: DC
Start: ? — End: 2018-04-21

## 2018-04-21 MED ORDER — FINASTERIDE 5 MG PO TABS
5.00 | ORAL_TABLET | ORAL | Status: DC
Start: 2018-04-21 — End: 2018-04-21

## 2018-04-21 MED ORDER — GENERIC EXTERNAL MEDICATION
4.00 | Status: DC
Start: ? — End: 2018-04-21

## 2018-04-21 MED ORDER — ALBUTEROL SULFATE (2.5 MG/3ML) 0.083% IN NEBU
2.50 | INHALATION_SOLUTION | RESPIRATORY_TRACT | Status: DC
Start: ? — End: 2018-04-21

## 2018-04-21 MED ORDER — SODIUM CHLORIDE 0.9 % IV SOLN
10.00 | INTRAVENOUS | Status: DC
Start: ? — End: 2018-04-21

## 2018-04-21 MED ORDER — GENERIC EXTERNAL MEDICATION
650.00 | Status: DC
Start: ? — End: 2018-04-21

## 2018-04-21 MED ORDER — SODIUM CHLORIDE 0.9 % IV SOLN
75.00 | INTRAVENOUS | Status: DC
Start: ? — End: 2018-04-21

## 2018-04-21 MED ORDER — GENERIC EXTERNAL MEDICATION
10.00 | Status: DC
Start: ? — End: 2018-04-21

## 2018-04-21 MED ORDER — MUPIROCIN 2 % EX OINT
TOPICAL_OINTMENT | CUTANEOUS | Status: DC
Start: 2018-04-20 — End: 2018-04-21

## 2018-04-21 MED ORDER — ZOLPIDEM TARTRATE 5 MG PO TABS
5.00 | ORAL_TABLET | ORAL | Status: DC
Start: ? — End: 2018-04-21

## 2018-05-15 ENCOUNTER — Ambulatory Visit (HOSPITAL_BASED_OUTPATIENT_CLINIC_OR_DEPARTMENT_OTHER)
Admission: RE | Admit: 2018-05-15 | Discharge: 2018-05-15 | Disposition: A | Discharge status: Home / Self Care | Payer: Medicare Other | Source: Ambulatory Visit | Attending: Pulmonary Disease | Admitting: Pulmonary Disease

## 2018-05-15 DIAGNOSIS — R911 Solitary pulmonary nodule: Secondary | ICD-10-CM

## 2018-05-15 DIAGNOSIS — R918 Other nonspecific abnormal finding of lung field: Secondary | ICD-10-CM | POA: Diagnosis not present

## 2018-06-04 ENCOUNTER — Ambulatory Visit (INDEPENDENT_AMBULATORY_CARE_PROVIDER_SITE_OTHER): Payer: Medicare Other | Admitting: Pulmonary Disease

## 2018-06-04 ENCOUNTER — Encounter: Payer: Self-pay | Admitting: Pulmonary Disease

## 2018-06-04 VITALS — BP 116/68 | HR 84 | Ht 67.0 in | Wt 175.0 lb

## 2018-06-04 DIAGNOSIS — R918 Other nonspecific abnormal finding of lung field: Secondary | ICD-10-CM | POA: Diagnosis not present

## 2018-06-04 DIAGNOSIS — F17210 Nicotine dependence, cigarettes, uncomplicated: Secondary | ICD-10-CM

## 2018-06-04 DIAGNOSIS — J432 Centrilobular emphysema: Secondary | ICD-10-CM

## 2018-06-04 DIAGNOSIS — R911 Solitary pulmonary nodule: Secondary | ICD-10-CM | POA: Diagnosis not present

## 2018-06-04 NOTE — Patient Instructions (Signed)
Centrilobular emphysema: You have very mild lung disease From my standpoint you could stop taking Advair He use albuterol as needed for chest tightness wheezing or shortness of breath You are still at increased risk for respiratory infections of getting a flu shot every year and having your pneumonia vaccines up-to-date is very important Practice good hand hygiene Stay active If you need to have urologic surgery this should not be a problem and from my standpoint you are safe to proceed  Lung nodule in the right upper lobe: As stated today I agree with radiology that this is most likely a scar We will plan on repeating a CT scan of your chest in 6 months  I will see you back in 6 months or sooner if needed

## 2018-06-04 NOTE — Progress Notes (Signed)
Synopsis: Referred in November 2019 for a pulmonary nodule, emphysema.  Still smoking cigarettes as of November 2019.  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  Subjective:   PATIENT ID: Patrick Morris GENDER: male DOB: [birth year], MRN: [id number]   HPI  Chief Complaint  Patient presents with  . Follow-up    review 1/15 CT chest.  pt denies any current breathing complaints.    Dorrance returns to clinic for evaluation of his most recent CT scan of his chest.  He has been well from a respiratory standpoint without bronchitis or pneumonia.  He continues to smoke cigarettes.  He is not interested in quitting right now.  He knows that he has a kidney mass that should be resected but he has not decided if he wants surgery or not.  He recently was sick with a gastrointestinal illness.2  Past Medical History:  Diagnosis Date  . BPH (benign prostatic hyperplasia)    his specialist is recommending surgery.  . Cataract   . Colitis   . COPD (chronic obstructive pulmonary disease) ([place])   . Ear infection   . GERD (gastroesophageal reflux disease)   . Salmonella   . Sinus infection       Review of Systems  Constitutional: Negative for chills, fever, malaise/fatigue and weight loss.  HENT: Negative for congestion, nosebleeds, sinus pain and sore throat.   Eyes: Negative for photophobia, pain and discharge.  Respiratory: Positive for cough and shortness of breath. Negative for hemoptysis, sputum production and wheezing.   Cardiovascular: Negative for chest pain, palpitations, orthopnea and leg swelling.  Gastrointestinal: Negative for abdominal pain, constipation, diarrhea, nausea and vomiting.  Genitourinary: Negative for dysuria, frequency, hematuria and urgency.  Musculoskeletal: Negative for back pain, joint pain, myalgias and neck pain.  Skin:  Negative for itching and rash.  Neurological: Negative for tingling, tremors, sensory change, speech change, focal weakness, seizures, weakness and headaches.  Psychiatric/Behavioral: Negative for memory loss, substance abuse and suicidal ideas. The patient is not nervous/anxious.       Objective:  Physical Exam   Vitals:   06/04/18 1451  BP: 116/68  Pulse: 84  SpO2: 98%  Weight: 175 lb (79.4 kg)  Height: 5\' 7"  (1.702 m)   RA  Gen: well appearing HENT: OP clear, TM's clear, neck supple PULM: CTA B, normal percussion CV: RRR, no mgr, trace edema GI: BS+, soft, nontender Derm: no cyanosis or rash Psyche: normal mood and affect   CBC    Component Value Date/Time   WBC 10.8 (H) 03/25/2018 1655   RBC 4.43 03/25/2018 1655   HGB 13.6 03/25/2018 1655   HCT 39.9 03/25/2018 1655   PLT 354.0 03/25/2018 1655   MCV 90.0 03/25/2018 1655   MCH 29.3 02/18/2012 1533   MCHC 34.0 03/25/2018 1655   RDW 13.1 03/25/2018 1655   LYMPHSABS 1.7 03/25/2018 1655   MONOABS 1.1 (H) 03/25/2018 1655   EOSABS 0.5 03/25/2018 1655   BASOSABS 0.1 03/25/2018 1655     Chest imaging: 01/2018 screening CT chest showed a right upper lobe nodule in the apex, some emphysema; images personally reviewed; non-specific finding in the T8 vertebral body January 2020 CT chest images independently reviewed showing centrilobular emphysema, up to 2 cm sized nodule in the right upper lobe stable compared to the October study, pleural-based  PFT: November 2019 ratio  83%, FEV1 2.9 L 99% predicted  Labs:  Path:  Echo:  Heart Catheterization:       Assessment & Plan:   Pulmonary nodule  Centrilobular emphysema (HCC)  Cigarette smoker  Discussion: This has been a stable interval for [place].  The radiologist also feels (as I do) that the lesion in the right upper lobe is most likely a scar as it is pleural-based and has not grown.  Further, he says that he remembers being told that he had a scar in  his lungs many years ago.  He has centrilobular emphysema but interestingly he does not have COPD based on his lung function testing which was normal earlier this year.  Plan: Centrilobular emphysema: You have very mild lung disease From my standpoint you could stop taking Advair He use albuterol as needed for chest tightness wheezing or shortness of breath You are still at increased risk for respiratory infections of getting a flu shot every year and having your pneumonia vaccines up-to-date is very important Practice good hand hygiene Stay active If you need to have urologic surgery this should not be a problem and from my standpoint you are safe to proceed  Lung nodule in the right upper lobe: As stated today I agree with radiology that this is most likely a scar We will plan on repeating a CT scan of your chest in 6 months  I will see you back in 6 months or sooner if needed   Current Outpatient Medications:  .  acetaminophen (TYLENOL) 500 MG tablet, Take 1,000 mg by mouth every 6 (six) hours as needed. For pain, Disp: , Rfl:  .  ADVAIR DISKUS 250-50 MCG/DOSE AEPB, INHALE 1 DOSE BY MOUTH TWICE DAILY, Disp: 60 each, Rfl: 2 .  albuterol (PROVENTIL HFA;VENTOLIN HFA) 108 (90 Base) MCG/ACT inhaler, Inhale 2 puffs into the lungs every 6 (six) hours as needed for wheezing or shortness of breath., Disp: 1 Inhaler, Rfl: 0 .  ammonium lactate (LAC-HYDRIN) 12 % cream, Apply topically as needed for dry skin. Apply to area twice daily, Disp: 140 g, Rfl: 1 .  azelastine (ASTELIN) 0.1 % nasal spray, Place 2 sprays into both nostrils 2 (two) times daily. Use in each nostril as directed, Disp: 30 mL, Rfl: 12 .  clotrimazole-betamethasone (LOTRISONE) cream, Apply 1 application topically 2 (two) times daily., Disp: 30 g, Rfl: 0 .  doxazosin (CARDURA) 4 MG tablet, Take 4 mg by mouth daily., Disp: , Rfl:  .  famotidine (PEPCID) 20 MG tablet, Take 1 tablet (20 mg total) by mouth 2 (two) times daily., Disp:  60 tablet, Rfl: 0 .  finasteride (PROSCAR) 5 MG tablet, Take 5 mg by mouth daily., Disp: , Rfl:  .  fluticasone (FLONASE) 50 MCG/ACT nasal spray, Place 2 sprays into both nostrils daily., Disp: 16 g, Rfl: 1 .  ondansetron (ZOFRAN ODT) 8 MG disintegrating tablet, Take 1 tablet (8 mg total) by mouth every 8 (eight) hours as needed for nausea or vomiting., Disp: 20 tablet, Rfl: 0

## 2018-06-04 NOTE — Addendum Note (Signed)
Addended by: Len Blalock on: 06/04/2018 03:26 PM   Modules accepted: Orders

## 2018-07-05 ENCOUNTER — Ambulatory Visit (HOSPITAL_BASED_OUTPATIENT_CLINIC_OR_DEPARTMENT_OTHER)
Admission: RE | Admit: 2018-07-05 | Discharge: 2018-07-05 | Disposition: A | Discharge status: Home / Self Care | Payer: Medicare Other | Source: Ambulatory Visit | Attending: Medical | Admitting: Medical

## 2018-07-05 ENCOUNTER — Encounter: Payer: Self-pay | Admitting: Medical

## 2018-07-05 ENCOUNTER — Ambulatory Visit (INDEPENDENT_AMBULATORY_CARE_PROVIDER_SITE_OTHER): Payer: Medicare Other | Admitting: Medical

## 2018-07-05 ENCOUNTER — Telehealth: Payer: Self-pay | Admitting: Medical

## 2018-07-05 VITALS — BP 114/65 | HR 79 | Temp 98.1°F | Resp 16 | Ht 67.0 in | Wt 175.8 lb

## 2018-07-05 DIAGNOSIS — R059 Cough, unspecified: Secondary | ICD-10-CM

## 2018-07-05 DIAGNOSIS — R509 Fever, unspecified: Secondary | ICD-10-CM | POA: Diagnosis not present

## 2018-07-05 DIAGNOSIS — R05 Cough: Secondary | ICD-10-CM | POA: Diagnosis not present

## 2018-07-05 DIAGNOSIS — J40 Bronchitis, not specified as acute or chronic: Secondary | ICD-10-CM | POA: Insufficient documentation

## 2018-07-05 DIAGNOSIS — J0100 Acute maxillary sinusitis, unspecified: Secondary | ICD-10-CM | POA: Diagnosis not present

## 2018-07-05 LAB — CBC WITH DIFFERENTIAL/PLATELET
Basophils Absolute: 0.1 10*3/uL (ref 0.0–0.1)
Basophils Relative: 0.9 % (ref 0.0–3.0)
Eosinophils Absolute: 0.9 10*3/uL — ABNORMAL HIGH (ref 0.0–0.7)
Eosinophils Relative: 10.1 % — ABNORMAL HIGH (ref 0.0–5.0)
HCT: 40.8 % (ref 39.0–52.0)
Hemoglobin: 13.7 g/dL (ref 13.0–17.0)
Lymphocytes Relative: 20 % (ref 12.0–46.0)
Lymphs Abs: 1.7 10*3/uL (ref 0.7–4.0)
MCHC: 33.7 g/dL (ref 30.0–36.0)
MCV: 89.6 fl (ref 78.0–100.0)
MONO ABS: 0.5 10*3/uL (ref 0.1–1.0)
Monocytes Relative: 5.9 % (ref 3.0–12.0)
Neutro Abs: 5.4 10*3/uL (ref 1.4–7.7)
Neutrophils Relative %: 63.1 % (ref 43.0–77.0)
Platelets: 300 10*3/uL (ref 150.0–400.0)
RBC: 4.56 Mil/uL (ref 4.22–5.81)
RDW: 13.4 % (ref 11.5–15.5)
WBC: 8.6 10*3/uL (ref 4.0–10.5)

## 2018-07-05 MED ORDER — BENZONATATE 100 MG PO CAPS: 100.0000 mg | ORAL_CAPSULE | Freq: Three times a day (TID) | ORAL | 0 refills | Status: DC | PRN

## 2018-07-05 MED ORDER — DOXYCYCLINE HYCLATE 100 MG PO TABS: 100.0000 mg | ORAL_TABLET | Freq: Two times a day (BID) | ORAL | 0 refills | Status: DC

## 2018-07-05 MED ORDER — CEFTRIAXONE SODIUM 1 G IJ SOLR
1.0000 g | Freq: Once | INTRAMUSCULAR | Status: AC
  Administered 2018-07-05: 1 g via INTRAMUSCULAR

## 2018-07-05 MED ORDER — FLUTICASONE PROPIONATE 50 MCG/ACT NA SUSP: 2.0000 | Freq: Every day | NASAL | 1 refills | Status: DC

## 2018-07-05 NOTE — Patient Instructions (Signed)
You appear to have bronchitis and sinusitis. Rest hydrate and tylenol for fever. I am prescribing cough medicine benzonate, and oral antibiotic. For your nasal congestion you could use otc the counter nasal steroid flonase.  Chest xray and cbc today. Will see if by xray you have any pneumonia.  We gave you Rocephin 1 g IM injection today.  We will follow your x-ray and lab results and determine which oral antibiotic to give.  We will try to call you regarding x-ray results later today.    Continue with current inhalers.  If you start to get wheezing and short of breath despite using your current inhalers then might need to call and taper dose of prednisone.  Follow up in 7-10 days or as needed

## 2018-07-05 NOTE — Telephone Encounter (Signed)
Rx doxy sent to pt pharmacy.

## 2018-07-05 NOTE — Progress Notes (Signed)
Subjective:    Patient ID: Patrick Morris, male    DOB: [birth year], 68 y.o.   MRN: [id number]  HPI Pt in for one month of intermittent cough, sinus pressure, runny nose, and chest congestion. Symptoms have waxed and waned for entire month.  No bodyaches.  Sometimes feels feverish. He  thinks occasional sweats at night.  No recent travel outside of [place].  Pt is as smoker.   Review of Systems  Constitutional: Positive for fatigue and fever. Negative for chills.       Maybe fever.  HENT: Positive for congestion, sinus pressure and sinus pain. Negative for postnasal drip and sneezing.   Respiratory: Positive for cough.   Cardiovascular: Negative for palpitations.  Gastrointestinal: Negative for abdominal pain.  Genitourinary: Negative for dysuria, flank pain and frequency.  Musculoskeletal: Negative for back pain and gait problem.  Neurological: Negative for dizziness and headaches.  Hematological: Negative for adenopathy. Does not bruise/bleed easily.  Psychiatric/Behavioral: Negative for behavioral problems, confusion and decreased concentration.    Past Medical History:  Diagnosis Date  . BPH (benign prostatic hyperplasia)    his specialist is recommending surgery.  . Cataract   . Colitis   . COPD (chronic obstructive pulmonary disease) ([place])   . Ear infection   . GERD (gastroesophageal reflux disease)   . Salmonella   . Sinus infection      Social History   Socioeconomic History  . Marital status: Married    Spouse name: Not on file  . Number of children: Not on file  . Years of education: Not on file  . Highest education level: Not on file  Occupational History  . Not on file  Social Needs  . Financial resource strain: Not on file  . Food insecurity:    Worry: Not on file    Inability: Not on file  . Transportation needs:    Medical: Not on file    Non-medical: Not on file  Tobacco Use  . Smoking status: Current Every Day Smoker    Packs/day: 1.00   . Smokeless tobacco: Never Used  . Tobacco comment: down to 0.5ppd  Substance and Sexual Activity  . Alcohol use: Never    Frequency: Never  . Drug use: No  . Sexual activity: Not Currently    Birth control/protection: None  Lifestyle  . Physical activity:    Days per week: Not on file    Minutes per session: Not on file  . Stress: Not on file  Relationships  . Social connections:    Talks on phone: Not on file    Gets together: Not on file    Attends religious service: Not on file    Active member of club or organization: Not on file    Attends meetings of clubs or organizations: Not on file    Relationship status: Not on file  . Intimate partner violence:    Fear of current or ex partner: Not on file    Emotionally abused: Not on file    Physically abused: Not on file    Forced sexual activity: Not on file  Other Topics Concern  . Not on file  Social History Narrative  . Not on file    No past surgical history on file.  Family History  Problem Relation Age of Onset  . Hyperlipidemia Father   . Stroke Father   . Alcohol abuse Father   . Throat cancer Brother   . Lymphoma Brother  Allergies  Allergen Reactions  . Aspirin Other (See Comments)    Acid reflux and drooling    Current Outpatient Medications on File Prior to Visit  Medication Sig Dispense Refill  . acetaminophen (TYLENOL) 500 MG tablet Take 1,000 mg by mouth every 6 (six) hours as needed. For pain    . ADVAIR DISKUS 250-50 MCG/DOSE AEPB INHALE 1 DOSE BY MOUTH TWICE DAILY 60 each 2  . albuterol (PROVENTIL HFA;VENTOLIN HFA) 108 (90 Base) MCG/ACT inhaler Inhale 2 puffs into the lungs every 6 (six) hours as needed for wheezing or shortness of breath. 1 Inhaler 0  . ammonium lactate (LAC-HYDRIN) 12 % cream Apply topically as needed for dry skin. Apply to area twice daily 140 g 1  . azelastine (ASTELIN) 0.1 % nasal spray Place 2 sprays into both nostrils 2 (two) times daily. Use in each nostril as  directed 30 mL 12  . clotrimazole-betamethasone (LOTRISONE) cream Apply 1 application topically 2 (two) times daily. 30 g 0  . doxazosin (CARDURA) 4 MG tablet Take 4 mg by mouth daily.    . famotidine (PEPCID) 20 MG tablet Take 1 tablet (20 mg total) by mouth 2 (two) times daily. 60 tablet 0  . finasteride (PROSCAR) 5 MG tablet Take 5 mg by mouth daily.    . fluticasone (FLONASE) 50 MCG/ACT nasal spray Place 2 sprays into both nostrils daily. 16 g 1  . ondansetron (ZOFRAN ODT) 8 MG disintegrating tablet Take 1 tablet (8 mg total) by mouth every 8 (eight) hours as needed for nausea or vomiting. 20 tablet 0   No current facility-administered medications on file prior to visit.     BP 114/65   Pulse 79   Temp 98.1 F (36.7 C) (Oral)   Resp 16   Ht 5\' 7"  (1.702 m)   Wt 175 lb 12.8 oz (79.7 kg)   SpO2 99%   BMI 27.53 kg/m       Objective:   Physical Exam  General  Mental Status - Alert. General Appearance - Well groomed. Not in acute distress.  Skin Rashes- No Rashes.  HEENT Head- Normal. Ear Auditory Canal - Left- Normal. Right - Normal.Tympanic Membrane- Left- Normal. Right- Normal. Eye Sclera/Conjunctiva- Left- Normal. Right- Normal. Nose & Sinuses Nasal Mucosa- Left-  Boggy and Congested. Right-  Boggy and  Congested.Bilateral maxillary but no  frontal sinus pressure. Mouth & Throat Lips: Upper Lip- Normal: no dryness, cracking, pallor, cyanosis, or vesicular eruption. Lower Lip-Normal: no dryness, cracking, pallor, cyanosis or vesicular eruption. Buccal Mucosa- Bilateral- No Aphthous ulcers. Oropharynx- No Discharge or Erythema. Tonsils: Characteristics- Bilateral- No Erythema or Congestion. Size/Enlargement- Bilateral- No enlargement. Discharge- bilateral-None.  Neck Neck- Supple. No Masses.   Chest and Lung Exam Auscultation: Breath Sounds:-Clear even and unlabored.  Cardiovascular Auscultation:Rythm- Regular, rate and rhythm. Murmurs & Other Heart  Sounds:Ausculatation of the heart reveal- No Murmurs.  Lymphatic Head & Neck General Head & Neck Lymphatics: Bilateral: Description- No Localized lymphadenopathy.       Assessment & Plan:   You appear to have bronchitis and sinusitis. Rest hydrate and tylenol for fever. I am prescribing cough medicine benzonate, and oral antibiotic. For your nasal congestion you could use otc the counter nasal steroid flonase.  Chest xray and cbc today. Will see if by xray you have any pneumonia.  We gave you Rocephin 1 g IM injection today.  We will follow your x-ray and lab results and determine which oral antibiotic to give.  We will try  to call you regarding x-ray results later today.    Continue with current inhalers.  If you start to get wheezing and short of breath despite using your current inhalers then might need to call and taper dose of prednisone.  Follow up in 7-10 days or as needed  [employer], [employer]

## 2018-07-12 ENCOUNTER — Ambulatory Visit (INDEPENDENT_AMBULATORY_CARE_PROVIDER_SITE_OTHER): Payer: Medicare Other | Admitting: Medical

## 2018-07-12 ENCOUNTER — Other Ambulatory Visit: Payer: Self-pay

## 2018-07-12 ENCOUNTER — Encounter: Payer: Self-pay | Admitting: Medical

## 2018-07-12 VITALS — BP 125/68 | HR 66 | Temp 97.9°F | Resp 16 | Ht 67.0 in | Wt 178.0 lb

## 2018-07-12 DIAGNOSIS — J40 Bronchitis, not specified as acute or chronic: Secondary | ICD-10-CM | POA: Diagnosis not present

## 2018-07-12 DIAGNOSIS — J320 Chronic maxillary sinusitis: Secondary | ICD-10-CM

## 2018-07-12 NOTE — Progress Notes (Signed)
Subjective:    Patient ID: Patrick Morris, male    DOB: May 22, 1950, 68 y.o.   MRN: [id number]  HPI  Pt in for follow up.   He feels overall better but still mild residual cough. He is able to sleep. He has hx of smoking for years and copd.  Pt states sinus feel better but still some residual tenderness(comes and goes/more at night). His cough is not productive now.  His chest xray did not show any pneumonia on last xray.  Pt states breathing is stable. Not wheezing recently.  Pt given benzonatate, doxy and rocephen im injection last visit.   Review of Systems  Constitutional: Negative for chills, fatigue and fever.  HENT: Positive for sinus pressure and sinus pain. Negative for congestion, ear pain, sneezing and sore throat.   Respiratory: Positive for cough. Negative for chest tightness, shortness of breath and wheezing.        Residual cough.  Cardiovascular: Negative for chest pain and palpitations.  Gastrointestinal: Negative for abdominal pain.  Musculoskeletal: Negative for back pain.  Neurological: Negative for dizziness, speech difficulty, weakness, numbness and headaches.  Hematological: Negative for adenopathy. Does not bruise/bleed easily.  Psychiatric/Behavioral: Negative for behavioral problems and confusion.    Past Medical History:  Diagnosis Date  . BPH (benign prostatic hyperplasia)    his specialist is recommending surgery.  . Cataract   . Colitis   . COPD (chronic obstructive pulmonary disease) ([place])   . Ear infection   . GERD (gastroesophageal reflux disease)   . Salmonella   . Sinus infection      Social History   Socioeconomic History  . Marital status: Married    Spouse name: Not on file  . Number of children: Not on file  . Years of education: Not on file  . Highest education level: Not on file  Occupational History  . Not on file  Social Needs  . Financial resource strain: Not on file  . Food insecurity:    Worry: Not on file   Inability: Not on file  . Transportation needs:    Medical: Not on file    Non-medical: Not on file  Tobacco Use  . Smoking status: Current Every Day Smoker    Packs/day: 1.00  . Smokeless tobacco: Never Used  . Tobacco comment: down to 0.5ppd  Substance and Sexual Activity  . Alcohol use: Never    Frequency: Never  . Drug use: No  . Sexual activity: Not Currently    Birth control/protection: None  Lifestyle  . Physical activity:    Days per week: Not on file    Minutes per session: Not on file  . Stress: Not on file  Relationships  . Social connections:    Talks on phone: Not on file    Gets together: Not on file    Attends religious service: Not on file    Active member of club or organization: Not on file    Attends meetings of clubs or organizations: Not on file    Relationship status: Not on file  . Intimate partner violence:    Fear of current or ex partner: Not on file    Emotionally abused: Not on file    Physically abused: Not on file    Forced sexual activity: Not on file  Other Topics Concern  . Not on file  Social History Narrative  . Not on file    No past surgical history on file.  Family History  Problem Relation Age of Onset  . Hyperlipidemia Father   . Stroke Father   . Alcohol abuse Father   . Throat cancer Brother   . Lymphoma Brother     Allergies  Allergen Reactions  . Aspirin Other (See Comments)    Acid reflux and drooling    Current Outpatient Medications on File Prior to Visit  Medication Sig Dispense Refill  . acetaminophen (TYLENOL) 500 MG tablet Take 1,000 mg by mouth every 6 (six) hours as needed. For pain    . ADVAIR DISKUS 250-50 MCG/DOSE AEPB INHALE 1 DOSE BY MOUTH TWICE DAILY 60 each 2  . albuterol (PROVENTIL HFA;VENTOLIN HFA) 108 (90 Base) MCG/ACT inhaler Inhale 2 puffs into the lungs every 6 (six) hours as needed for wheezing or shortness of breath. 1 Inhaler 0  . ammonium lactate (LAC-HYDRIN) 12 % cream Apply topically  as needed for dry skin. Apply to area twice daily 140 g 1  . azelastine (ASTELIN) 0.1 % nasal spray Place 2 sprays into both nostrils 2 (two) times daily. Use in each nostril as directed 30 mL 12  . benzonatate (TESSALON) 100 MG capsule Take 1 capsule (100 mg total) by mouth 3 (three) times daily as needed for cough. 30 capsule 0  . clotrimazole-betamethasone (LOTRISONE) cream Apply 1 application topically 2 (two) times daily. 30 g 0  . doxazosin (CARDURA) 4 MG tablet Take 4 mg by mouth daily.    Marland Kitchen doxycycline (VIBRA-TABS) 100 MG tablet Take 1 tablet (100 mg total) by mouth 2 (two) times daily. Can give caps or generic. 20 tablet 0  . famotidine (PEPCID) 20 MG tablet Take 1 tablet (20 mg total) by mouth 2 (two) times daily. 60 tablet 0  . finasteride (PROSCAR) 5 MG tablet Take 5 mg by mouth daily.    . fluticasone (FLONASE) 50 MCG/ACT nasal spray Place 2 sprays into both nostrils daily. 16 g 1  . fluticasone (FLONASE) 50 MCG/ACT nasal spray Place 2 sprays into both nostrils daily. 16 g 1  . ondansetron (ZOFRAN ODT) 8 MG disintegrating tablet Take 1 tablet (8 mg total) by mouth every 8 (eight) hours as needed for nausea or vomiting. 20 tablet 0   No current facility-administered medications on file prior to visit.     BP 125/68   Pulse 66   Temp 97.9 F (36.6 C) (Oral)   Resp 16   Ht 5\' 7"  (1.702 m)   Wt 178 lb (80.7 kg)   SpO2 98%   BMI 27.88 kg/m       Objective:   Physical Exam  General  Mental Status - Alert. General Appearance - Well groomed. Not in acute distress.  Skin Rashes- No Rashes.  HEENT Head- Normal. Ear Auditory Canal - Left- Normal. Right - Normal.Tympanic Membrane- Left- Normal. Right- Normal. Eye Sclera/Conjunctiva- Left- Normal. Right- Normal. Nose & Sinuses Nasal Mucosa- Left-  Boggy and Congested. Right-  Boggy and  Congested.Bilateral no maxillary and no frontal sinus pressure. Mouth & Throat Lips: Upper Lip- Normal: no dryness, cracking, pallor,  cyanosis, or vesicular eruption. Lower Lip-Normal: no dryness, cracking, pallor, cyanosis or vesicular eruption. Buccal Mucosa- Bilateral- No Aphthous ulcers. Oropharynx- No Discharge or Erythema. Tonsils: Characteristics- Bilateral- No Erythema or Congestion. Size/Enlargement- Bilateral- No enlargement. Discharge- bilateral-None.  Neck Neck- Supple. No Masses.   Chest and Lung Exam Auscultation: Breath Sounds:-Clear even and unlabored.  Cardiovascular Auscultation:Rythm- Regular, rate and rhythm. Murmurs & Other Heart Sounds:Ausculatation of the heart reveal- No Murmurs.  Lymphatic  Head & Neck General Head & Neck Lymphatics: Bilateral: Description- No Localized lymphadenopathy.       Assessment & Plan:  You do appear to be much improved with prior sinus infection and bronchitis.  You reports some intermittent residual sinus pain but on exam today no tenderness at all.  I would continue doxycycline for the remaining 3 to 4 days of treatment.  He still have the benzonatate that you can use for cough.  Continue with Flonase for nasal congestion as we are approaching spring allergy season.  No wheezing recently but continue with your inhalers as prescribed by a specialist.  Follow-up as needed.  Mackie Pai, PA-C

## 2018-07-12 NOTE — Patient Instructions (Signed)
You do appear to be much improved with prior sinus infection and bronchitis.  You reports some intermittent residual sinus pain but on exam today no tenderness at all.  I would continue doxycycline for the remaining 3 to 4 days of treatment.  He still have the benzonatate that you can use for cough.  Continue with Flonase for nasal congestion as we are approaching spring allergy season.  No wheezing recently but continue with your inhalers as prescribed by a specialist.  Follow-up as needed

## 2018-07-17 ENCOUNTER — Other Ambulatory Visit: Payer: Self-pay | Admitting: Medical

## 2018-07-18 MED ORDER — AZITHROMYCIN 250 MG PO TABS: ORAL_TABLET | ORAL | 0 refills | Status: DC

## 2018-07-18 NOTE — Telephone Encounter (Signed)
I opened up this as encounter note. No notes routed to me. Can you re- route that note. Only got what appears to be refill request for antibiotic? Any other info as to patient signs and symptoms?

## 2018-07-18 NOTE — Telephone Encounter (Signed)
Ok. Let him know I think switching to azithromycin at this point would be good idea. Ask him to give [place] update in 5-6 days.  Thanks, for helping.

## 2018-07-18 NOTE — Telephone Encounter (Signed)
Spoke to the patient and he is still coughing, no fever, has improved some, he is coughing up brownish mucus  His worst symptom is the continued cough (although better than before), but not gone. Would like something to calm this down.

## 2018-07-19 NOTE — Telephone Encounter (Signed)
Called the patient informed his wife of provider instructions. She verbalized understanding.

## 2018-07-31 ENCOUNTER — Other Ambulatory Visit: Payer: Self-pay | Admitting: Medical

## 2018-08-02 ENCOUNTER — Ambulatory Visit (INDEPENDENT_AMBULATORY_CARE_PROVIDER_SITE_OTHER): Payer: Medicare Other | Admitting: Medical

## 2018-08-02 ENCOUNTER — Other Ambulatory Visit: Payer: Self-pay

## 2018-08-02 ENCOUNTER — Encounter: Payer: Self-pay | Admitting: Medical

## 2018-08-02 DIAGNOSIS — J329 Chronic sinusitis, unspecified: Secondary | ICD-10-CM | POA: Diagnosis not present

## 2018-08-02 DIAGNOSIS — R05 Cough: Secondary | ICD-10-CM

## 2018-08-02 DIAGNOSIS — R059 Cough, unspecified: Secondary | ICD-10-CM

## 2018-08-02 DIAGNOSIS — M791 Myalgia, unspecified site: Secondary | ICD-10-CM

## 2018-08-02 MED ORDER — AZITHROMYCIN 250 MG PO TABS: ORAL_TABLET | ORAL | 0 refills | Status: DC

## 2018-08-02 MED ORDER — BENZONATATE 100 MG PO CAPS: 100.0000 mg | ORAL_CAPSULE | Freq: Three times a day (TID) | ORAL | 0 refills | Status: DC | PRN

## 2018-08-02 NOTE — Progress Notes (Signed)
Subjective:    Patient ID: Patrick Morris, male    DOB: January 30, 1951, 68 y.o.   MRN: [id number]  HPI Virtual Visit via Video Note  I tried to connect with Patrick Morris on 08/02/18 at 10:00 AM EDT by a video enabled telemedicine application. But was unable to.   I discussed the limitations of evaluation and management by phone. and the availability of in person appointments. The patient expressed understanding and agreed to proceed.   Pt did not have ability to do webex. Pt does have apple device but after staff spent time explaining how to use facetime they were unable to connect. Called there number various times with no success.   History of Present Illness:  Pt has been seen twice over past month. Below are the last 2 assessment and plans.  1)You appear to have bronchitis and sinusitis. Rest hydrate and tylenol for fever. I am prescribing cough medicine benzonate, and oral antibiotic. For your nasal congestion you could use otc the counter nasal steroid flonase.  Chest xray and cbc today. Will see if by xray you have any pneumonia.  We gave you Rocephin 1 g IM injection today.  We will follow your x-ray and lab results and determine which oral antibiotic to give.  We will try to call you regarding x-ray results later today.    Continue with current inhalers.  If you start to get wheezing and short of breath despite using your current inhalers then might need to call and taper dose of prednisone.  2nd visit A and P.  2)You do appear to be much improved with prior sinus infection and bronchitis.  You reports some intermittent residual sinus pain but on exam today no tenderness at all.  I would continue doxycycline for the remaining 3 to 4 days of treatment.  He still have the benzonatate that you can use for cough.  Continue with Flonase for nasal congestion as we are approaching spring allergy season.  No wheezing recently but continue with your inhalers as prescribed by a  specialist.  I talked with patient and he does feel weak. He is coughing up some phlem. He states he feels run down. He states has some mild sore throat. He is still blowing out colored mucus from his nose. He has not been sweating at night. He has some faint feeling of sick to his stomach. No diarrhea or vomiting. His muscles to ache a little bit.   Pt states he felt better after I treated him on 07/11/2017. Then 5 days ago he started to feel sick. Pt wife has cough recently as well. Bodyaches started about one week ago.     Observations/Objective: None available.  Assessment and Plan: I treated patient over the last month twice for what appeared to be bronchitis and sinusitis.  Initially he did improve.  He does have history of heavy smoking and some COPD.  His breathing never worsened significantly. Some nasal congestion and faint sinus pressure recurrent over past 5 days.  However over the last 5 to 7 days he reports some moderate fatigue with muscle aches as well.  He was coughing intermittently on the phone but no fever reported.  With his age and history of smoking do think at this point  need to start  considering evaluation  in the emergency department.  Currently our office is not seeing patients with respiratory conditions as we do not have PPE's.  Also we are not testing patient's for covid  in the office.  Some going to investigate the procedure and how to send patient to the emergency department.      I did talk with MD who is familiar with covid testing criteria recently in place in hospital and procedures at ED. Also talked with management staff who states testing at our facility not good option for testing. (States only testing if appears will need admission)   Since you presently sound to be stable, I decided to treat with azithromycin for possible sinusitis and possible reocurring bronchitis. Rx benzonatate. Continue your inhalers. Refill benzonatate for cough. If you get fever  or shortness of breath then recommend evaluation at [facility] ED. I was told they have tent outside and are evaluating patients/screening patients to see if they need evaluation for covid. This way I they can direct you to proper place to get wait and then get evaluated in event you worsen.   Follow-up date to be determined.Follow Up Instructions:    I discussed the assessment and treatment plan with the patient. The patient was provided an opportunity to ask questions and all were answered. The patient agreed with the plan and demonstrated an understanding of the instructions.   The patient was advised to call back or seek an in-person evaluation if the symptoms worsen or if the condition fails to improve as anticipated.  I provided 25 minutes of non-face-to-face time during this encounter.  50% of time was spent with patient explaining that at this point with the viral pandemic spreading and affecting stated [place] along with his age and medical history that I do want him to be further evaluated in the emergency department.   Mackie Pai, PA-C    Review of Systems  Constitutional: Positive for fatigue.  HENT: Positive for congestion, sinus pressure and sinus pain.   Respiratory: Positive for cough. Negative for shortness of breath and wheezing.   Cardiovascular: Negative for chest pain and palpitations.  Musculoskeletal: Positive for myalgias.  Hematological: Negative for adenopathy. Does not bruise/bleed easily.  Psychiatric/Behavioral: Negative for behavioral problems and confusion.    Past Medical History:  Diagnosis Date   BPH (benign prostatic hyperplasia)    his specialist is recommending surgery.   Cataract    Colitis    COPD (chronic obstructive pulmonary disease) (HCC)    Ear infection    GERD (gastroesophageal reflux disease)    Salmonella    Sinus infection      Social History   Socioeconomic History   Marital status: Married    Spouse  name: Not on file   Number of children: Not on file   Years of education: Not on file   Highest education level: Not on file  Occupational History   Not on file  Social Needs   Financial resource strain: Not on file   Food insecurity:    Worry: Not on file    Inability: Not on file   Transportation needs:    Medical: Not on file    Non-medical: Not on file  Tobacco Use   Smoking status: Current Every Day Smoker    Packs/day: 1.00   Smokeless tobacco: Never Used   Tobacco comment: down to 0.5ppd  Substance and Sexual Activity   Alcohol use: Never    Frequency: Never   Drug use: No   Sexual activity: Not Currently    Birth control/protection: None  Lifestyle   Physical activity:    Days per week: Not on file    Minutes  per session: Not on file   Stress: Not on file  Relationships   Social connections:    Talks on phone: Not on file    Gets together: Not on file    Attends religious service: Not on file    Active member of club or organization: Not on file    Attends meetings of clubs or organizations: Not on file    Relationship status: Not on file   Intimate partner violence:    Fear of current or ex partner: Not on file    Emotionally abused: Not on file    Physically abused: Not on file    Forced sexual activity: Not on file  Other Topics Concern   Not on file  Social History Narrative   Not on file    No past surgical history on file.  Family History  Problem Relation Age of Onset   Hyperlipidemia Father    Stroke Father    Alcohol abuse Father    Throat cancer Brother    Lymphoma Brother     Allergies  Allergen Reactions   Aspirin Other (See Comments)    Acid reflux and drooling    Current Outpatient Medications on File Prior to Visit  Medication Sig Dispense Refill   acetaminophen (TYLENOL) 500 MG tablet Take 1,000 mg by mouth every 6 (six) hours as needed. For pain     ADVAIR DISKUS 250-50 MCG/DOSE AEPB INHALE 1 DOSE  BY MOUTH TWICE DAILY 60 each 2   albuterol (PROVENTIL HFA;VENTOLIN HFA) 108 (90 Base) MCG/ACT inhaler Inhale 2 puffs into the lungs every 6 (six) hours as needed for wheezing or shortness of breath. 1 Inhaler 0   ammonium lactate (LAC-HYDRIN) 12 % cream Apply topically as needed for dry skin. Apply to area twice daily 140 g 1   azelastine (ASTELIN) 0.1 % nasal spray Place 2 sprays into both nostrils 2 (two) times daily. Use in each nostril as directed 30 mL 12   azithromycin (ZITHROMAX) 250 MG tablet Take 2 tablets by mouth on day 1, followed by 1 tablet by mouth daily for 4 days. 6 tablet 0   benzonatate (TESSALON) 100 MG capsule Take 1 capsule (100 mg total) by mouth 3 (three) times daily as needed for cough. 30 capsule 0   clotrimazole-betamethasone (LOTRISONE) cream Apply 1 application topically 2 (two) times daily. 30 g 0   doxazosin (CARDURA) 4 MG tablet Take 4 mg by mouth daily.     doxycycline (VIBRA-TABS) 100 MG tablet Take 1 tablet (100 mg total) by mouth 2 (two) times daily. Can give caps or generic. 20 tablet 0   famotidine (PEPCID) 20 MG tablet Take 1 tablet (20 mg total) by mouth 2 (two) times daily. 60 tablet 0   finasteride (PROSCAR) 5 MG tablet Take 5 mg by mouth daily.     fluticasone (FLONASE) 50 MCG/ACT nasal spray Place 2 sprays into both nostrils daily. 16 g 1   fluticasone (FLONASE) 50 MCG/ACT nasal spray Place 2 sprays into both nostrils daily. 16 g 1   ondansetron (ZOFRAN ODT) 8 MG disintegrating tablet Take 1 tablet (8 mg total) by mouth every 8 (eight) hours as needed for nausea or vomiting. 20 tablet 0   No current facility-administered medications on file prior to visit.     There were no vitals taken for this visit.      Objective:   Physical Exam  NA.      Assessment & Plan:

## 2018-08-02 NOTE — Patient Instructions (Addendum)
I treated patient over the last month twice for what appeared to be bronchitis and sinusitis.  Initially he did improve.  He does have history of heavy smoking and some COPD.  His breathing never worsened significantly. Some nasal congestion and faint sinus pressure recurrent over past 5 days.  However over the last 5 to 7 days he also  reports some moderate fatigue with muscle aches as well.  He was coughing intermittently on the phone but no fever reported.  With his age and history of smoking do think at this point  need to start  considering evaluation  in the emergency department.  Currently our office is not seeing patients with respiratory conditions as we do not have PPE's.  Also we are not testing patient's for covid in the office.  Some going to investigate the procedure and how to send patient to the emergency department.      I did talk with MD who is familiar with covid testing criteria recently in place in hospital and procedures at ED. Also talked with management staff who states testing at our facility not good option for testing. (States only testing if appears will need admission)  Since you presently sound to be stable, I decided to treat with azithromycin for possible sinusitis and possible reocurring bronchitis. Rx benzonatate. Continue your inhalers. Refill benzonatate for cough. If you get fever or shortness of breath then recommend evaluation at [facility] ED. I was told they have tent outside and are evaluating patients/screening patients to see if they need evaluation for covid. This way I they can direct you to proper place to get wait and then get evaluated in event you worsen.  I tried to call patient back but no answer.Was going to advise the above. Called back numerous times.

## 2018-08-15 ENCOUNTER — Other Ambulatory Visit: Payer: Self-pay | Admitting: Medical

## 2018-08-16 NOTE — Telephone Encounter (Signed)
Pt requesting refill on Advair Diskus last filled 03/01/18. Please advise.

## 2018-08-18 NOTE — Telephone Encounter (Signed)
Rx refill of advair sent to pt pharmacy.

## 2018-08-20 ENCOUNTER — Other Ambulatory Visit: Payer: Self-pay | Admitting: Medical

## 2018-09-17 ENCOUNTER — Other Ambulatory Visit: Payer: Self-pay

## 2018-09-17 ENCOUNTER — Encounter: Payer: Self-pay | Admitting: Medical

## 2018-09-17 ENCOUNTER — Ambulatory Visit (INDEPENDENT_AMBULATORY_CARE_PROVIDER_SITE_OTHER): Payer: Medicare Other | Admitting: Medical

## 2018-09-17 DIAGNOSIS — L089 Local infection of the skin and subcutaneous tissue, unspecified: Secondary | ICD-10-CM | POA: Diagnosis not present

## 2018-09-17 DIAGNOSIS — L989 Disorder of the skin and subcutaneous tissue, unspecified: Secondary | ICD-10-CM | POA: Diagnosis not present

## 2018-09-17 MED ORDER — SULFAMETHOXAZOLE-TRIMETHOPRIM 800-160 MG PO TABS: 1.0000 | ORAL_TABLET | Freq: Two times a day (BID) | ORAL | 0 refills | Status: DC

## 2018-09-17 NOTE — Progress Notes (Signed)
   Subjective:    Patient ID: Patrick Morris, male    DOB: 05-16-1950, 68 y.o.   MRN: [id number]  HPI Virtual Visit via Video Note  I connected with Patrick Morris on 09/17/18 at 11:20 AM EDT by a video enabled telemedicine application and verified that I am speaking with the correct person using two identifiers.  Location: Patient: home Provider: home   I discussed the limitations of evaluation and management by telemedicine and the availability of in person appointments. The patient expressed understanding and agreed to proceed.  Pt did not check his bp or pulse today.  History of Present Illness:  Pt states that he has some sores on his arms over past month. Small scab at first. This area have gradually worsened. Area just left wrist moderate large about 2.0 cm in width. Pt called a dermatologist and they gave him appointment in one month. Some yellow dc in the mornings.        Observations/Objective: General- no acute distress. Lungs- appear even and unlabored. Neuro- gross motor function appears intact. Skin- below left wrist about 3 inches below.1 cm raw appearing lesion. Other area appears to be on rt arm but only small broken down area.   Assessment and Plan:  You appear to have broken down area of skin left forearm with possible skin infection. Also have concern for skin cancer as this area has rapidly worsened and you have hx of skin CA in past.  Will place referral to dermatologist and hopefully we can get you in within a week.  Mackie Pai, PA-C  Follow up with me in one week with me if delay in referral to dermatologist. Follow Up Instructions:    I discussed the assessment and treatment plan with the patient. The patient was provided an opportunity to ask questions and all were answered. The patient agreed with the plan and demonstrated an understanding of the instructions.   The patient was advised to call back or seek an in-person evaluation if the  symptoms worsen or if the condition fails to improve as anticipated.     Mackie Pai, PA-C    Review of Systems     Objective:   Physical Exam        Assessment & Plan:

## 2018-09-17 NOTE — Patient Instructions (Signed)
You appear to have broken down area of skin left forearm with possible skin infection. Also have concern for skin cancer as this area has rapidly worsened and you have hx of skin CA in past.  Will place referral to dermatologist and hopefully we can get you in within a week.  Follow up with me in one week with me if delay in referral to dermatologist.

## 2018-09-24 ENCOUNTER — Ambulatory Visit: Payer: Medicare Other | Admitting: Medical

## 2018-09-27 ENCOUNTER — Other Ambulatory Visit: Payer: Self-pay

## 2018-09-27 ENCOUNTER — Encounter: Payer: Self-pay | Admitting: Medical

## 2018-09-27 ENCOUNTER — Ambulatory Visit (INDEPENDENT_AMBULATORY_CARE_PROVIDER_SITE_OTHER): Payer: Medicare Other | Admitting: Medical

## 2018-09-27 DIAGNOSIS — L089 Local infection of the skin and subcutaneous tissue, unspecified: Secondary | ICD-10-CM | POA: Diagnosis not present

## 2018-09-27 DIAGNOSIS — S59912A Unspecified injury of left forearm, initial encounter: Secondary | ICD-10-CM

## 2018-09-27 DIAGNOSIS — N2889 Other specified disorders of kidney and ureter: Secondary | ICD-10-CM | POA: Diagnosis not present

## 2018-09-27 MED ORDER — DOXYCYCLINE HYCLATE 100 MG PO TABS: 100.0000 mg | ORAL_TABLET | Freq: Two times a day (BID) | ORAL | 0 refills | Status: DC

## 2018-09-27 MED ORDER — MUPIROCIN 2 % EX OINT: 1.0000 "application " | TOPICAL_OINTMENT | Freq: Two times a day (BID) | CUTANEOUS | 1 refills | Status: DC

## 2018-09-27 NOTE — Patient Instructions (Signed)
Appear to have skin infection of wounds after dermatologist excision of lesions. Areas healing by secondary intention. Rx mupirocin topical and doxycycline oral antibiotic. Area should gradually heal. If area change of worsen let us know.  For kidney mass follow up with urologist on Monday.  Follow up in 7 days or as needed

## 2018-09-27 NOTE — Progress Notes (Signed)
   Subjective:    Patient ID: Patrick Morris, male    DOB: 1951-01-14, 68 y.o.   MRN: [id number]  HPI  Virtual Visit via Video Note  I connected with Patrick Morris on 09/27/18 at  2:00 PM EDT by a video enabled telemedicine application and verified that I am speaking with the correct person using two identifiers.  Location: Patient: home Provider: home   I discussed the limitations of evaluation and management by telemedicine and the availability of in person appointments. The patient expressed understanding and agreed to proceed.  History of Present Illness:  Pt has 2 areas on his left forearm which he had taken off and sent to pathologist. He was told cancer and has follow up with derm on December 26, 2018.   He states the wounds are little sore to touch.  Both areas healing by secondary intention. Proximal forearm wound has slight yellow DC.  Pt has mass on kidney upper pole. This is followed by urologist. They plan to do surgery in near future. Follow up with urologist on Monday.   Observations/Objective:  General-no acute distress, pleasant, oriented. Lungs- on inspection lungs appear unlabored. Neck- no tracheal deviation or jvd on inspection. Neuro- gross motor function appears intact. Left forearm- 2 area where excision of lesions were present. Both about 1 cm wide and about 5 mm deep per pt.   Assessment and Plan: Appear to have skin infection of wounds after dermatologist excision of lesions. Areas healing by secondary intention. Rx mupirocin topical and doxycycline oral antibiotic. Area should gradually heal. If area change of worsen let us know.  For kidney mass follow up with urologist on Monday.  Follow up in 7 days or as needed  Mackie Pai, PA-C  Follow Up Instructions:    I discussed the assessment and treatment plan with the patient. The patient was provided an opportunity to ask questions and all were answered. The patient agreed with the plan and  demonstrated an understanding of the instructions.   The patient was advised to call back or seek an in-person evaluation if the symptoms worsen or if the condition fails to improve as anticipated.     Mackie Pai, PA-C   Assessment and Plan: Appear to have skin infection of wounds after dermatologist excision of lesions. Areas healing by secondary intention. Rx mupirocin topical and doxycycline oral antibiotic. Area should gradually heal. If area change of worsen let us know.  For kidney mass follow up with urologist on Monday.  Follow up in 7 days or as needed    Review of Systems  Constitutional: Negative for chills, fatigue and fever.  Respiratory: Negative for cough, chest tightness, shortness of breath and wheezing.   Cardiovascular: Negative for chest pain and palpitations.  Skin:       See hpi and exam.  Hematological: Negative for adenopathy. Does not bruise/bleed easily.       Objective:   Physical Exam        Assessment & Plan:

## 2018-10-07 ENCOUNTER — Other Ambulatory Visit: Payer: Self-pay

## 2018-10-07 ENCOUNTER — Ambulatory Visit (INDEPENDENT_AMBULATORY_CARE_PROVIDER_SITE_OTHER): Payer: Medicare Other | Admitting: Medical

## 2018-10-07 ENCOUNTER — Encounter: Payer: Self-pay | Admitting: Medical

## 2018-10-07 DIAGNOSIS — L089 Local infection of the skin and subcutaneous tissue, unspecified: Secondary | ICD-10-CM | POA: Diagnosis not present

## 2018-10-07 DIAGNOSIS — T148XXA Other injury of unspecified body region, initial encounter: Secondary | ICD-10-CM

## 2018-10-07 NOTE — Patient Instructions (Addendum)
Your wounds are healing slowly by secondary intention. They width of wound and depth should continue healing(narrowing of width and filling in) and may take another 2-3 weeks. I don't think you need further doxycycline but do want you to continue to use mupirocin.   If the area not filled in by 2 weeks then notify me as in that event would consider referral to wound care.  At end video cut off. Then called pt and he would not pick up. So left message explaining the above.

## 2018-10-07 NOTE — Progress Notes (Signed)
   Subjective:    Patient ID: Patrick Morris, male    DOB: [birth year], 68 y.o.   MRN: [id number]  HPI  Virtual Visit via Video Note  I connected with Patrick Morris on 10/07/18 at  2:40 PM EDT by a video enabled telemedicine application and verified that I am speaking with the correct person using two identifiers.  Pt did not check his vitals today.  Location: Patient: home Provider: home   I discussed the limitations of evaluation and management by telemedicine and the availability of in person appointments. The patient expressed understanding and agreed to proceed.  History of Present Illness:   Pt in states his wound regions are healing slowly. Area are still filling in and looking dry. Now only scant yellow discharge at times. He has been on doxycycline.  Wounds were secondary to dermatology procedure.  No fever,no chills or sweats.   Observations/Objective:  General-no acute distress, pleasant, oriented. Lungs- on inspection lungs appear unlabored. Neck- no tracheal deviation or jvd on inspection. Neuro- gross motor function appears intact. Left forearm- 2 area where excision of lesions were present. Both appear narrower and filling in. They appear dry now. No yellow dc presently.   Assessment and Plan: Your wounds are healing slowly by secondary intention. They width of wound and depth should continue healing and may take another 2-3 weeks. I don't think you need further doxycycline but do want you to continue to use mupirocin.   If the area not filled in by 2 weeks then notify me as in that event would consider referral to wound care.  Follow Up Instructions: Your wounds are healing slowly by secondary intention. They width of wound and depth should continue healing and may take another 2-3 weeks. I don't think you need further doxycycline but do want you to continue to use mupirocin.   If the area not filled in by 2 weeks then notify me as in that event would  consider referral to wound care.   I discussed the assessment and treatment plan with the patient. The patient was provided an opportunity to ask questions and all were answered. The patient agreed with the plan and demonstrated an understanding of the instructions.   The patient was advised to call back or seek an in-person evaluation if the symptoms worsen or if the condition fails to improve as anticipated.  I provided 17minutes of non-face-to-face time during this encounter.   Mackie Pai, PA-C    Review of Systems  Constitutional: Negative for chills, fatigue and fever.  Eyes: Negative for photophobia and pain.  Respiratory: Negative for cough, choking, shortness of breath and wheezing.   Cardiovascular: Negative for chest pain and palpitations.  Gastrointestinal: Negative for abdominal pain.  Skin:       Still healing by secondary intention.  Neurological: Negative for dizziness, speech difficulty, weakness and headaches.  Hematological: Negative for adenopathy. Does not bruise/bleed easily.  Psychiatric/Behavioral: Negative for behavioral problems and confusion.       Objective:   Physical Exam        Assessment & Plan:

## 2018-10-22 ENCOUNTER — Telehealth: Payer: Self-pay

## 2018-10-22 NOTE — Telephone Encounter (Signed)
Just an FYI

## 2018-10-22 NOTE — Telephone Encounter (Signed)
Copied from [place] [id number]. Topic: General - Other >> Oct 22, 2018  9:26 AM Lennox Solders wrote: Reason for CRM:pt is calling to let edward know the left arm is better and healing

## 2018-10-22 NOTE — Telephone Encounter (Signed)
Noted. Thanks for update

## 2018-11-10 ENCOUNTER — Other Ambulatory Visit: Payer: Self-pay | Admitting: Medical

## 2019-04-11 ENCOUNTER — Other Ambulatory Visit: Payer: Self-pay | Admitting: Medical

## 2019-04-23 ENCOUNTER — Other Ambulatory Visit: Payer: Self-pay | Admitting: Urology

## 2019-04-23 DIAGNOSIS — N2889 Other specified disorders of kidney and ureter: Secondary | ICD-10-CM

## 2019-05-07 ENCOUNTER — Other Ambulatory Visit: Payer: Self-pay | Admitting: *Deleted

## 2019-05-07 ENCOUNTER — Other Ambulatory Visit: Payer: Self-pay

## 2019-05-07 ENCOUNTER — Ambulatory Visit
Admission: RE | Admit: 2019-05-07 | Discharge: 2019-05-07 | Disposition: A | Discharge status: Home / Self Care | Payer: Medicare Other | Source: Ambulatory Visit | Attending: Urology | Admitting: Urology

## 2019-05-07 ENCOUNTER — Encounter: Payer: Self-pay | Admitting: *Deleted

## 2019-05-07 DIAGNOSIS — N2889 Other specified disorders of kidney and ureter: Secondary | ICD-10-CM

## 2019-05-07 HISTORY — PX: IR RADIOLOGIST EVAL & MGMT: IMG5224

## 2019-05-07 NOTE — Consult Note (Signed)
Chief Complaint: Indeterminate right-sided renal lesion   Referring Physician(s): Eskew,Lawrence A  History of Present Illness: Patrick Morris is a 69 y.o. male with past medical history significant for BPH and COPD who was found to have a slowly enlarging lesion involving the superior pole of his right kidney on surveillance renal ultrasound performed 04/14/2019.  For this, the patient has been referred to the interventional radiology clinic for potential percutaneous management.  The patient is seen in telemedicine consultation today.  Patient is asymptomatic in regards to this incidentally discovered right-sided renal lesion.  Specifically, no flank pain or hematuria.  No unintentional weight loss.  No change in appetite or energy level.    Past Medical History:  Diagnosis Date  . BPH (benign prostatic hyperplasia)    his specialist is recommending surgery.  . Cataract   . Colitis   . COPD (chronic obstructive pulmonary disease) ([place])   . Ear infection   . GERD (gastroesophageal reflux disease)   . Salmonella   . Sinus infection     No past surgical history on file.  Allergies: Aspirin  Medications: Prior to Admission medications   Medication Sig Start Date End Date Taking? Authorizing Provider  acetaminophen (TYLENOL) 500 MG tablet Take 1,000 mg by mouth every 6 (six) hours as needed. For pain    [provider]  ADVAIR DISKUS 250-50 MCG/DOSE AEPB INHALE 1 DOSE BY MOUTH TWICE DAILY 04/11/19   Saguier, Percell Miller, PA-C  albuterol (PROVENTIL HFA;VENTOLIN HFA) 108 (90 Base) MCG/ACT inhaler Inhale 2 puffs into the lungs every 6 (six) hours as needed for wheezing or shortness of breath. 07/13/15   Saguier, Percell Miller, PA-C  ammonium lactate (AMLACTIN) 12 % cream APPLY  CREAM TOPICALLY AS NEEDED FOR DRY SKIN  TWICE DAILY 11/11/18   Saguier, Percell Miller, PA-C  azelastine (ASTELIN) 0.1 % nasal spray Place 2 sprays into both nostrils 2 (two) times daily. Use in each nostril as  directed 08/07/17   Saguier, Percell Miller, PA-C  azithromycin (ZITHROMAX) 250 MG tablet Take 2 tablets by mouth on day 1, followed by 1 tablet by mouth daily for 4 days. 08/02/18   Saguier, Percell Miller, PA-C  benzonatate (TESSALON) 100 MG capsule Take 1 capsule (100 mg total) by mouth 3 (three) times daily as needed for cough. 08/02/18   Saguier, Percell Miller, PA-C  clotrimazole-betamethasone (LOTRISONE) cream Apply 1 application topically 2 (two) times daily. 05/07/16   Saguier, Percell Miller, PA-C  doxazosin (CARDURA) 4 MG tablet Take 4 mg by mouth daily.    [provider]  doxycycline (VIBRA-TABS) 100 MG tablet Take 1 tablet (100 mg total) by mouth 2 (two) times daily. Can give caps or generic. 09/27/18   Saguier, Percell Miller, PA-C  famotidine (PEPCID) 20 MG tablet Take 1 tablet (20 mg total) by mouth 2 (two) times daily. 03/25/18   Saguier, Percell Miller, PA-C  finasteride (PROSCAR) 5 MG tablet Take 5 mg by mouth daily.    [provider]  fluticasone (FLONASE) 50 MCG/ACT nasal spray Place 2 sprays into both nostrils daily. 01/29/18   Saguier, Percell Miller, PA-C  fluticasone (FLONASE) 50 MCG/ACT nasal spray Place 2 sprays into both nostrils daily. 07/05/18   Saguier, Percell Miller, PA-C  mupirocin ointment (BACTROBAN) 2 % Place 1 application into the nose 2 (two) times daily. 09/27/18   Saguier, Percell Miller, PA-C  ondansetron (ZOFRAN ODT) 8 MG disintegrating tablet Take 1 tablet (8 mg total) by mouth every 8 (eight) hours as needed for nausea or vomiting. 03/25/18   Saguier, Percell Miller, PA-C  sulfamethoxazole-trimethoprim (BACTRIM DS) 800-160 MG tablet Take 1 tablet by mouth 2 (two) times daily. 09/17/18   Saguier, Percell Miller, PA-C     Family History  Problem Relation Age of Onset  . Hyperlipidemia Father   . Stroke Father   . Alcohol abuse Father   . Throat cancer Brother   . Lymphoma Brother     Social History   Socioeconomic History  . Marital status: Married    Spouse name: Not on file  . Number of children: Not on file  . Years of  education: Not on file  . Highest education level: Not on file  Occupational History  . Not on file  Tobacco Use  . Smoking status: Current Every Day Smoker    Packs/day: 1.00  . Smokeless tobacco: Never Used  . Tobacco comment: down to 0.5ppd  Substance and Sexual Activity  . Alcohol use: Never  . Drug use: No  . Sexual activity: Not Currently    Birth control/protection: None  Other Topics Concern  . Not on file  Social History Narrative  . Not on file   Social Determinants of Health   Financial Resource Strain:   . Difficulty of Paying Living Expenses: Not on file  Food Insecurity:   . Worried About Charity fundraiser in the Last Year: Not on file  . Ran Out of Food in the Last Year: Not on file  Transportation Needs:   . Lack of Transportation (Medical): Not on file  . Lack of Transportation (Non-Medical): Not on file  Physical Activity:   . Days of Exercise per Week: Not on file  . Minutes of Exercise per Session: Not on file  Stress:   . Feeling of Stress : Not on file  Social Connections:   . Frequency of Communication with Friends and Family: Not on file  . Frequency of Social Gatherings with Friends and Family: Not on file  . Attends Religious Services: Not on file  . Active Member of Clubs or Organizations: Not on file  . Attends Archivist Meetings: Not on file  . Marital Status: Not on file    ECOG Status: 1 - Symptomatic but completely ambulatory  Review of Systems  Review of Systems: A 12 point ROS discussed and pertinent positives are indicated in the HPI above.  All other systems are negative.  Physical Exam No direct physical exam was performed (except for noted visual exam findings with Video Visits).   Vital Signs: There were no vitals taken for this visit.  Imaging:  Renal ultrasound - 04/14/2019; 09/19/2018 slight increase in size of slightly hyperechoic solid now approximately 2.2 x 2.0 x 2.1 cm lesion arising from the superior  pole of the right kidney, previously, 2.3 x 2.1 x 1.9 cm.    This lesion was also identified on chest CT performed 05/15/2018 measuring approximately 2.4 x 2.1 x 1.4 cm    Labs:  CBC: Recent Labs    07/05/18 1203  WBC 8.6  HGB 13.7  HCT 40.8  PLT 300.0    COAGS: No results for input(s): INR, APTT in the last 8760 hours.  BMP: No results for input(s): NA, K, CL, CO2, GLUCOSE, BUN, CALCIUM, CREATININE, GFRNONAA, GFRAA in the last 8760 hours.  Invalid input(s): CMP  LIVER FUNCTION TESTS: No results for input(s): BILITOT, AST, ALT, ALKPHOS, PROT, ALBUMIN in the last 8760 hours.  TUMOR MARKERS: No results for input(s): AFPTM, CEA, CA199, CHROMGRNA in the last 8760 hours.  Assessment  and Plan:  DONYEA BEVERLIN is a 69 y.o. male with past medical history significant for BPH and COPD who was found to have a slowly enlarging lesion involving the superior pole of his right kidney on surveillance renal ultrasound performed 04/14/2019.  For this, the patient has been referred to the interventional radiology clinic for potential percutaneous management.   Patient is asymptomatic in regards to this incidentally discovered right-sided renal lesion.    Personal review of renal ultrasound performed 04/14/2019 demonstrates slight increase in size of hyperechoic lesion arising from the superior pole of the right kidney measuring approximately 2.2 x 2.0 x 2.1 cm, previously, 2.3 x 2.1 x 1.9 cm when compared to renal ultrasound performed 09/19/2018.  This lesion was also incidentally seen on noncontrast chest CT performed 05/15/2018 measuring approximately 2.4 x 2.1 x 1.4 cm.  Unfortunately, a contrasted cross-sectional examination is not available at the time of this consultation though would be helpful for definitive lesion characterization.  As such, I will proceed with obtaining a abdominal MRI.  Regardless, prolonged conversations were held with the patient regarding potential treatment  options including continued conservative management (note, remote CT scan of the abdomen and pelvis performed 01/13/2012 demonstrated a tiny (approximately 0.5 cm) lesion at this location which would suggest a growth rate of approximately 2 mm/year, surgical resection (partial or complete nephrectomy) versus renal cryoablation.  I explained that based on the size (less than 3 cm) of the solitary worrisome right-sided renal lesion, the patient is a candidate for potential image guided cryoablation though the lesion's location does prove difficult for percutaneous management given its proximity to the posterior medial aspect of the right hemidiaphragm and associated interposed lung.  Preliminary conversations were held with the patient regarding the risks and benefits of image guided renal cryoablation including, but not limited to, failure to treat entire lesion, bleeding, infection, damage to adjacent structures, hematuria, urine leak, decrease in renal function or post procedural neuropathy.  I explained that the procedure entails general anesthesia as well as an overnight admission for continued observation and PCA usage.  All of the patient's questions were answered and patient is agreeable to pursue continued evaluation for potential percutaneous management of this lesion.  Plan: - Obtain a contrast-enhanced abdominal MRI for definitive lesion characterization. - The patient will be seen in follow-up consultation for definitive establishment of plan of care following the acquisition of the abdominal MRI.  The patient knows to call the interventional radiology clinic with any interval questions or concerns.  Thank you for this interesting consult.  I greatly enjoyed meeting ELIAV MECHLING and look forward to participating in their care.  A copy of this report was sent to the requesting provider on this date.  Electronically Signed: Sandi Mariscal 05/07/2019, 10:50 AM   I spent a total of 30 Minutes  in remote  clinical consultation, greater than 50% of which was counseling/coordinating care for indeterminate right-sided renal lesion.    Visit type: Audio only (telephone). Audio (no video) only due to patient's lack of internet/smartphone capability. Alternative for in-person consultation at [facility], [address] [place], [place], [place]. This visit type was conducted due to national recommendations for restrictions regarding the COVID-19 Pandemic (e.g. social distancing).  This format is felt to be most appropriate for this patient at this time.  All issues noted in this document were discussed and addressed.

## 2019-05-08 ENCOUNTER — Other Ambulatory Visit: Payer: Self-pay | Admitting: Interventional Radiology

## 2019-05-08 DIAGNOSIS — N2889 Other specified disorders of kidney and ureter: Secondary | ICD-10-CM

## 2019-05-27 ENCOUNTER — Encounter: Payer: Self-pay | Admitting: *Deleted

## 2019-05-27 ENCOUNTER — Ambulatory Visit
Admission: RE | Admit: 2019-05-27 | Discharge: 2019-05-27 | Disposition: A | Discharge status: Home / Self Care | Payer: Medicare Other | Source: Ambulatory Visit | Attending: Interventional Radiology | Admitting: Interventional Radiology

## 2019-05-27 ENCOUNTER — Other Ambulatory Visit: Payer: Self-pay

## 2019-05-27 DIAGNOSIS — N2889 Other specified disorders of kidney and ureter: Secondary | ICD-10-CM

## 2019-05-27 HISTORY — PX: IR RADIOLOGIST EVAL & MGMT: IMG5224

## 2019-05-27 NOTE — Progress Notes (Signed)
Patient ID: Patrick Morris, male   DOB: Nov 16, 1950, 69 y.o.   MRN: [id number]         Chief Complaint: Indeterminate right-sided renal lesion  Referring Physician(s): Eskew  History of Present Illness: Patrick Morris is a 69 y.o. male with past medical history significant for BPH and COPD who was found to have a slowly enlarging lesion involving the superior pole of his right kidney on surveillance renal ultrasound performed 04/14/2019.  Patient was initially seen in consultation for potential percutaneous management on 05/06/2018 and is seen again in consultation following acquisition of contrast-enhanced abdominal MRI performed 05/21/2019.  Again, patient remains asymptomatic in regards to this incidentally discovered right-sided renal lesion.  Specifically, no flank pain or hematuria.  No unintentional weight loss.  No change in appetite or energy level.  Past Medical History:  Diagnosis Date  . BPH (benign prostatic hyperplasia)    his specialist is recommending surgery.  . Cataract   . Colitis   . COPD (chronic obstructive pulmonary disease) ([place])   . Ear infection   . GERD (gastroesophageal reflux disease)   . Salmonella   . Sinus infection     Past Surgical History:  Procedure Laterality Date  . IR RADIOLOGIST EVAL & MGMT  05/07/2019    Allergies: Aspirin  Medications: Prior to Admission medications   Medication Sig Start Date End Date Taking? Authorizing Provider  acetaminophen (TYLENOL) 500 MG tablet Take 1,000 mg by mouth every 6 (six) hours as needed. For pain    [provider]  ADVAIR DISKUS 250-50 MCG/DOSE AEPB INHALE 1 DOSE BY MOUTH TWICE DAILY 04/11/19   Saguier, Percell Miller, PA-C  albuterol (PROVENTIL HFA;VENTOLIN HFA) 108 (90 Base) MCG/ACT inhaler Inhale 2 puffs into the lungs every 6 (six) hours as needed for wheezing or shortness of breath. 07/13/15   Saguier, Percell Miller, PA-C  ammonium lactate (AMLACTIN) 12 % cream APPLY  CREAM TOPICALLY AS NEEDED FOR DRY SKIN   TWICE DAILY 11/11/18   Saguier, Percell Miller, PA-C  azelastine (ASTELIN) 0.1 % nasal spray Place 2 sprays into both nostrils 2 (two) times daily. Use in each nostril as directed 08/07/17   Saguier, Percell Miller, PA-C  azithromycin (ZITHROMAX) 250 MG tablet Take 2 tablets by mouth on day 1, followed by 1 tablet by mouth daily for 4 days. 08/02/18   Saguier, Percell Miller, PA-C  benzonatate (TESSALON) 100 MG capsule Take 1 capsule (100 mg total) by mouth 3 (three) times daily as needed for cough. 08/02/18   Saguier, Percell Miller, PA-C  clotrimazole-betamethasone (LOTRISONE) cream Apply 1 application topically 2 (two) times daily. 05/07/16   Saguier, Percell Miller, PA-C  doxazosin (CARDURA) 4 MG tablet Take 4 mg by mouth daily.    [provider]  doxycycline (VIBRA-TABS) 100 MG tablet Take 1 tablet (100 mg total) by mouth 2 (two) times daily. Can give caps or generic. 09/27/18   Saguier, Percell Miller, PA-C  famotidine (PEPCID) 20 MG tablet Take 1 tablet (20 mg total) by mouth 2 (two) times daily. 03/25/18   Saguier, Percell Miller, PA-C  finasteride (PROSCAR) 5 MG tablet Take 5 mg by mouth daily.    [provider]  fluticasone (FLONASE) 50 MCG/ACT nasal spray Place 2 sprays into both nostrils daily. 01/29/18   Saguier, Percell Miller, PA-C  fluticasone (FLONASE) 50 MCG/ACT nasal spray Place 2 sprays into both nostrils daily. 07/05/18   Saguier, Percell Miller, PA-C  mupirocin ointment (BACTROBAN) 2 % Place 1 application into the nose 2 (two) times daily. 09/27/18   Saguier, Percell Miller, PA-C  ondansetron (ZOFRAN ODT) 8 MG disintegrating tablet Take 1 tablet (8 mg total) by mouth every 8 (eight) hours as needed for nausea or vomiting. 03/25/18   Saguier, Percell Miller, PA-C  sulfamethoxazole-trimethoprim (BACTRIM DS) 800-160 MG tablet Take 1 tablet by mouth 2 (two) times daily. 09/17/18   Saguier, Percell Miller, PA-C     Family History  Problem Relation Age of Onset  . Hyperlipidemia Father   . Stroke Father   . Alcohol abuse Father   . Throat cancer Brother   . Lymphoma  Brother     Social History   Socioeconomic History  . Marital status: Married    Spouse name: Not on file  . Number of children: Not on file  . Years of education: Not on file  . Highest education level: Not on file  Occupational History  . Not on file  Tobacco Use  . Smoking status: Current Every Day Smoker    Packs/day: 1.00  . Smokeless tobacco: Never Used  . Tobacco comment: down to 0.5ppd  Substance and Sexual Activity  . Alcohol use: Never  . Drug use: No  . Sexual activity: Not Currently    Birth control/protection: None  Other Topics Concern  . Not on file  Social History Narrative  . Not on file   Social Determinants of Health   Financial Resource Strain:   . Difficulty of Paying Living Expenses: Not on file  Food Insecurity:   . Worried About Charity fundraiser in the Last Year: Not on file  . Ran Out of Food in the Last Year: Not on file  Transportation Needs:   . Lack of Transportation (Medical): Not on file  . Lack of Transportation (Non-Medical): Not on file  Physical Activity:   . Days of Exercise per Week: Not on file  . Minutes of Exercise per Session: Not on file  Stress:   . Feeling of Stress : Not on file  Social Connections:   . Frequency of Communication with Friends and Family: Not on file  . Frequency of Social Gatherings with Friends and Family: Not on file  . Attends Religious Services: Not on file  . Active Member of Clubs or Organizations: Not on file  . Attends Archivist Meetings: Not on file  . Marital Status: Not on file    ECOG Status: 0 - Asymptomatic  Review of Systems  Review of Systems: A 12 point ROS discussed and pertinent positives are indicated in the HPI above.  All other systems are negative.  Physical Exam No direct physical exam was performed (except for noted visual exam findings with Video Visits).   Vital Signs: There were no vitals taken for this visit.  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  No additional worrisome renal lesions are identified.  There is no evidence of regional metastatic disease.  The right renal vein is widely patent.  IR Radiologist Eval & Mgmt  Result Date: 05/07/2019 Please refer to notes tab for details about interventional procedure. (Op Note)   Labs:  CBC: Recent Labs    07/05/18 1203  WBC 8.6  HGB 13.7  HCT 40.8  PLT 300.0    COAGS: No results for input(s): INR, APTT in the last 8760 hours.  BMP: No results for input(s): NA, K, CL, CO2,  GLUCOSE, BUN, CALCIUM, CREATININE, GFRNONAA, GFRAA in the last 8760 hours.  Invalid input(s): CMP  LIVER FUNCTION TESTS: No results for input(s): BILITOT, AST, ALT, ALKPHOS, PROT, ALBUMIN in the last 8760 hours.  TUMOR MARKERS: No results for input(s): AFPTM, CEA, CA199, CHROMGRNA in the last 8760 hours.  Assessment and Plan:  Patrick Morris is a 69 y.o. male with past medical history significant for BPH and COPD who was found to have a slowly enlarging lesion involving the superior pole of his right kidney on surveillance renal ultrasound performed 04/14/2019.  For this, the patient has been referred to the interventional radiology clinic for potential percutaneous management.   Patient remains asymptomatic in regards to this incidentally discovered right-sided renal lesion.    Personal review of renal ultrasound performed 04/14/2019 demonstrates slight increase in size of hyperechoic lesion arising from the superior pole of the right kidney measuring approximately 2.2 x 2.0 x 2.1 cm, previously, 2.3 x 2.1 x 1.9 cm when compared to renal ultrasound performed 09/19/2018.  This lesion was also incidentally seen on noncontrast chest CT performed 05/15/2018 measuring  approximately 2.4 x 2.1 x 1.4 cm.  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 No additional worrisome renal lesions are identified.  There is no evidence of regional metastatic disease.  The right renal vein is widely patent.  Prolonged conversations were held with the patient regarding potential treatment options including continued conservative management (note, remote CT scan of the abdomen and pelvis performed 01/13/2012 demonstrated a tiny (approximately 0.5 cm) lesion at this location which would suggest a growth rate of approximately 2 mm/year, surgical resection (partial or complete nephrectomy) versus renal cryoablation.  I explained that based on the size (less than 3 cm) of the solitary worrisome right-sided renal lesion, the patient is a candidate for potential image guided cryoablation though the lesion's location does prove difficult for percutaneous management given its proximity to the posterior medial aspect of the right hemidiaphragm and associated interposed lung.  Conversations were held with the patient regarding the risks and benefits of image guided renal cryoablation including, but not limited to, failure to treat entire lesion, bleeding, infection, damage to adjacent structures (including the lung, potentially requiring chest tube placement), hematuria, urine leak, decrease in renal function or post procedural neuropathy.  I explained that the procedure entails general anesthesia as well as an overnight admission for continued observation and PCA usage.  I also explained that as we remain in the midst of the Covid pandemic, many elective procedures requiring admission are being postponed at this time.  Ultimately, following the above  prolonged and detailed conversation, the patient has decided to pursue continued conservative management/surveillance at this time.    As such, we will obtain a surveillance renal protocol CT scan of the abdomen in 3 months (late April/early May 2021).  Note, CT scan will be obtained in lieu of MRI as patient tolerated the MRI poorly given required breath-holds.   Given the slow growth rate of the renal lesion as well as the potential inability to schedule the procedure at this time given the Covid pandemic, I feel that this is a reasonable treatment plan.  Plan: - The patient will be seen in repeat consultation following acquisition of surveillance renal protocol CT scan of the abdomen and pelvis in 3 months (late  April/early May 2021).  Note, CT will be obtained in lieu of MRI as patient tolerated the MRI poorly.  The patient knows to call the interventional radiology clinic with any interval questions or concerns.  A copy of this report was sent to the requesting provider on this date.  Electronically Signed: Sandi Mariscal 05/27/2019, 4:46 PM   I spent a total of 15 Minutes in remote  clinical consultation, greater than 50% of which was counseling/coordinating care for initially discovered right-sided renal lesion.    Visit type: Audio only (telephone). Audio (no video) only due to patient's lack of internet/smartphone capability. Alternative for in-person consultation at [facility], [address] [place], [place], [place]. This visit type was conducted due to national recommendations for restrictions regarding the COVID-19 Pandemic (e.g. social distancing).  This format is felt to be most appropriate for this patient at this time.  All issues noted in this document were discussed and addressed.

## 2019-07-28 ENCOUNTER — Other Ambulatory Visit: Payer: Self-pay | Admitting: Interventional Radiology

## 2019-07-28 ENCOUNTER — Other Ambulatory Visit: Payer: Self-pay

## 2019-07-28 DIAGNOSIS — N2889 Other specified disorders of kidney and ureter: Secondary | ICD-10-CM

## 2019-09-15 ENCOUNTER — Ambulatory Visit: Payer: Medicare Other | Admitting: Medical

## 2019-09-17 ENCOUNTER — Other Ambulatory Visit: Payer: Self-pay

## 2019-09-17 ENCOUNTER — Encounter: Payer: Self-pay | Admitting: Medical

## 2019-09-17 ENCOUNTER — Ambulatory Visit (INDEPENDENT_AMBULATORY_CARE_PROVIDER_SITE_OTHER): Payer: Medicare Other | Admitting: Medical

## 2019-09-17 ENCOUNTER — Ambulatory Visit (HOSPITAL_BASED_OUTPATIENT_CLINIC_OR_DEPARTMENT_OTHER)
Admission: RE | Admit: 2019-09-17 | Discharge: 2019-09-17 | Disposition: A | Discharge status: Home / Self Care | Payer: Medicare Other | Source: Ambulatory Visit | Attending: Medical | Admitting: Medical

## 2019-09-17 VITALS — BP 114/70 | HR 102 | Resp 18 | Ht 68.0 in | Wt 185.6 lb

## 2019-09-17 DIAGNOSIS — M79604 Pain in right leg: Secondary | ICD-10-CM | POA: Diagnosis present

## 2019-09-17 DIAGNOSIS — M5441 Lumbago with sciatica, right side: Secondary | ICD-10-CM | POA: Diagnosis present

## 2019-09-17 DIAGNOSIS — G8929 Other chronic pain: Secondary | ICD-10-CM | POA: Insufficient documentation

## 2019-09-17 DIAGNOSIS — F419 Anxiety disorder, unspecified: Secondary | ICD-10-CM | POA: Diagnosis not present

## 2019-09-17 MED ORDER — MELOXICAM 7.5 MG PO TABS: 7.5000 mg | ORAL_TABLET | Freq: Every day | ORAL | 0 refills | Status: DC

## 2019-09-17 MED ORDER — BUSPIRONE HCL 7.5 MG PO TABS: 7.5000 mg | ORAL_TABLET | Freq: Two times a day (BID) | ORAL | 0 refills | Status: DC

## 2019-09-17 NOTE — Progress Notes (Signed)
Subjective:    Patient ID: Patrick Morris, male    DOB: [birth year], 69 y.o.   MRN: [id number]  HPI  Pt in with some pain in back for a while. Some pain radiating to his left lower ext/thigh pain.   Back pain on and off for years.   He also has muscle cramps to left leg.  Pt states recent dx of rt side kidney cancer. Pt had imaging studies to assess the area. Pt urologist saw pt first and referred to specialist.   Recently feels anxious. He states always been the case. Some worse recently coincided with dx of kidney cancer.   Review of Systems  Constitutional: Negative for chills, fatigue and fever.  Respiratory: Negative for cough, chest tightness, shortness of breath and wheezing.   Cardiovascular: Negative for chest pain and palpitations.  Gastrointestinal: Negative for abdominal pain.  Genitourinary: Negative for dysuria, flank pain and frequency.  Musculoskeletal: Positive for back pain. Negative for neck pain and neck stiffness.       Leg pain left side.  Skin: Negative for rash.  Hematological: Negative for adenopathy. Does not bruise/bleed easily.    Past Medical History:  Diagnosis Date  . BPH (benign prostatic hyperplasia)    his specialist is recommending surgery.  . Cataract   . Colitis   . COPD (chronic obstructive pulmonary disease) ([place])   . Ear infection   . GERD (gastroesophageal reflux disease)   . Salmonella   . Sinus infection      Social History   Socioeconomic History  . Marital status: Married    Spouse name: Not on file  . Number of children: Not on file  . Years of education: Not on file  . Highest education level: Not on file  Occupational History  . Not on file  Tobacco Use  . Smoking status: Current Every Day Smoker    Packs/day: 1.00  . Smokeless tobacco: Never Used  . Tobacco comment: down to 0.5ppd  Substance and Sexual Activity  . Alcohol use: Never  . Drug use: No  . Sexual activity: Not Currently    Birth  control/protection: None  Other Topics Concern  . Not on file  Social History Narrative  . Not on file   Social Determinants of Health   Financial Resource Strain:   . Difficulty of Paying Living Expenses:   Food Insecurity:   . Worried About Charity fundraiser in the Last Year:   . Arboriculturist in the Last Year:   Transportation Needs:   . Film/video editor (Medical):   Marland Kitchen Lack of Transportation (Non-Medical):   Physical Activity:   . Days of Exercise per Week:   . Minutes of Exercise per Session:   Stress:   . Feeling of Stress :   Social Connections:   . Frequency of Communication with Friends and Family:   . Frequency of Social Gatherings with Friends and Family:   . Attends Religious Services:   . Active Member of Clubs or Organizations:   . Attends Archivist Meetings:   Marland Kitchen Marital Status:   Intimate Partner Violence:   . Fear of Current or Ex-Partner:   . Emotionally Abused:   Marland Kitchen Physically Abused:   . Sexually Abused:     Past Surgical History:  Procedure Laterality Date  . IR RADIOLOGIST EVAL & MGMT  05/07/2019  . IR RADIOLOGIST EVAL & MGMT  05/27/2019    Family History  Problem Relation  Age of Onset  . Hyperlipidemia Father   . Stroke Father   . Alcohol abuse Father   . Throat cancer Brother   . Lymphoma Brother     Allergies  Allergen Reactions  . Aspirin Other (See Comments)    Acid reflux and drooling    Current Outpatient Medications on File Prior to Visit  Medication Sig Dispense Refill  . acetaminophen (TYLENOL) 500 MG tablet Take 1,000 mg by mouth every 6 (six) hours as needed. For pain    . ADVAIR DISKUS 250-50 MCG/DOSE AEPB INHALE 1 DOSE BY MOUTH TWICE DAILY 60 each 3  . albuterol (PROVENTIL HFA;VENTOLIN HFA) 108 (90 Base) MCG/ACT inhaler Inhale 2 puffs into the lungs every 6 (six) hours as needed for wheezing or shortness of breath. (Patient not taking: Reported on 09/17/2019) 1 Inhaler 0  . ammonium lactate (AMLACTIN) 12 %  cream APPLY  CREAM TOPICALLY AS NEEDED FOR DRY SKIN  TWICE DAILY (Patient not taking: Reported on 09/17/2019) 280 g 0  . azelastine (ASTELIN) 0.1 % nasal spray Place 2 sprays into both nostrils 2 (two) times daily. Use in each nostril as directed (Patient not taking: Reported on 09/17/2019) 30 mL 12  . azithromycin (ZITHROMAX) 250 MG tablet Take 2 tablets by mouth on day 1, followed by 1 tablet by mouth daily for 4 days. (Patient not taking: Reported on 09/17/2019) 6 tablet 0  . benzonatate (TESSALON) 100 MG capsule Take 1 capsule (100 mg total) by mouth 3 (three) times daily as needed for cough. 30 capsule 0  . clotrimazole-betamethasone (LOTRISONE) cream Apply 1 application topically 2 (two) times daily. (Patient not taking: Reported on 09/17/2019) 30 g 0  . doxazosin (CARDURA) 4 MG tablet Take 4 mg by mouth daily.    Marland Kitchen doxycycline (VIBRA-TABS) 100 MG tablet Take 1 tablet (100 mg total) by mouth 2 (two) times daily. Can give caps or generic. (Patient not taking: Reported on 09/17/2019) 14 tablet 0  . famotidine (PEPCID) 20 MG tablet Take 1 tablet (20 mg total) by mouth 2 (two) times daily. 60 tablet 0  . finasteride (PROSCAR) 5 MG tablet Take 5 mg by mouth daily.    . fluticasone (FLONASE) 50 MCG/ACT nasal spray Place 2 sprays into both nostrils daily. (Patient not taking: Reported on 09/17/2019) 16 g 1  . fluticasone (FLONASE) 50 MCG/ACT nasal spray Place 2 sprays into both nostrils daily. (Patient not taking: Reported on 09/17/2019) 16 g 1  . mupirocin ointment (BACTROBAN) 2 % Place 1 application into the nose 2 (two) times daily. (Patient not taking: Reported on 09/17/2019) 22 g 1  . ondansetron (ZOFRAN ODT) 8 MG disintegrating tablet Take 1 tablet (8 mg total) by mouth every 8 (eight) hours as needed for nausea or vomiting. (Patient not taking: Reported on 09/17/2019) 20 tablet 0  . sulfamethoxazole-trimethoprim (BACTRIM DS) 800-160 MG tablet Take 1 tablet by mouth 2 (two) times daily. (Patient not taking:  Reported on 09/17/2019) 14 tablet 0   No current facility-administered medications on file prior to visit.    BP 114/70 (BP Location: Left Arm, Patient Position: Sitting, Cuff Size: Large)   Pulse (!) 102   Resp 18   Ht 5\' 8"  (1.727 m)   Wt 185 lb 9.6 oz (84.2 kg)   SpO2 97%   BMI 28.22 kg/m       Objective:   Physical Exam General- No acute distress. Pleasant patient. Neck- Full range of motion, no jvd Lungs- Clear, even and unlabored. Heart-  regular rate and rhythm. Neurologic- CNII- XII grossly intact.  Back- mid lumbar area tenderness. Pain on lying supine and sitting. Left hip- good rom no pain. Left thigh/femur- mild pain on palpation.      Assessment & Plan:  For your low back pain with radicular type features, I decided to get lumbar x-ray and include left femur x-ray.  Will prescribe meloxicam today for pain inflammation.  Rx advisement given.  Discussion on his atypical reaction to aspirin.  Unclear if has true allergy.  Possibly has used NSAIDs in the past with no reaction.  Recent stress and anxiety less worsen.  On discussion history of some anxiety in the past.  Some increase coinciding with diagnosis of right-sided kidney cancer.  Will prescribe BuSpar and see how you respond.  Follow-up in 10 to 14 days or as needed.  Time spent with patient today was 25  minutes which consisted of chart review, discussing diagnoses, work up, treatment and documentation.

## 2019-09-17 NOTE — Patient Instructions (Signed)
For your low back pain with radicular type features, I decided to get lumbar x-ray and include left femur x-ray.  Will prescribe meloxicam today for pain inflammation.  Rx advisement given.  Discussion on his atypical reaction to aspirin.  Unclear if has true allergy.  Possibly has used NSAIDs in the past with no reaction.  Recent stress and anxiety less worsen.  On discussion history of some anxiety in the past.  Some increase coinciding with diagnosis of right-sided kidney cancer.  Will prescribe BuSpar and see how you respond.  Follow-up in 10 to 14 days or as needed.

## 2019-10-02 ENCOUNTER — Telehealth: Payer: Self-pay | Admitting: Medical

## 2019-10-02 NOTE — Telephone Encounter (Signed)
Called to request results from imaging.  Spoke with hannah,cma

## 2019-10-06 ENCOUNTER — Other Ambulatory Visit: Payer: Self-pay | Admitting: Medical

## 2019-10-08 NOTE — Progress Notes (Signed)
Subjective:   Patrick Morris is a 69 y.o. male who presents for Medicare Annual/Subsequent preventive examination.  I connected with Patrick Morris today by telephone and verified that I am speaking with the correct person using two identifiers. Location patient: home Location provider: work Persons participating in the virtual visit: patient, Marine scientist.    I discussed the limitations, risks, security and privacy concerns of performing an evaluation and management service by telephone and the availability of in person appointments. I also discussed with the patient that there may be a patient responsible charge related to this service. The patient expressed understanding and verbally consented to this telephonic visit.    Interactive audio and video telecommunications were attempted between this provider and patient, however failed, due to patient having technical difficulties OR patient did not have access to video capability.  We continued and completed visit with audio only.  Some vital signs may be absent or patient reported.   Time Spent with patient on telephone encounter: 25 minutes  Review of Systems:   Cardiac Risk Factors include: male gender;advanced age (>29men, >60 women);smoking/ tobacco exposure     Objective:    Vitals: Ht 5\' 8"  (1.727 m)   Wt 185 lb (83.9 kg)   BMI 28.13 kg/m   Body mass index is 28.13 kg/m.  Advanced Directives 10/09/2019 08/07/2017  Does Patient Have a Medical Advance Directive? No No  Would patient like information on creating a medical advance directive? Yes (ED - Information included in AVS) No - Patient declined    Tobacco Social History   Tobacco Use  Smoking Status Current Every Day Smoker  . Packs/day: 1.00  . Years: 55.00  . Pack years: 55.00  Smokeless Tobacco Never Used  Tobacco Comment   down to 0.5ppd     Ready to quit: Not Answered Counseling given: Not Answered Comment: down to 0.5ppd   Clinical Intake:  Pre-visit preparation  completed: Yes  Pain : 0-10 Pain Score: 5  Pain Type: Chronic pain Pain Location: Back Pain Descriptors / Indicators: Throbbing Pain Onset: More than a month ago Pain Frequency: Intermittent Effect of Pain on Daily Activities: Taking Meloxicam     Nutritional Status: BMI 25 -29 Overweight Nutritional Risks: None Diabetes: No  How often do you need to have someone help you when you read instructions, pamphlets, or other written materials from your doctor or pharmacy?: 1 - Never  Interpreter Needed?: No  Information entered by :: Patrick Hamman LPN  Past Medical History:  Diagnosis Date  . BPH (benign prostatic hyperplasia)    his specialist is recommending surgery.  . Cataract   . Colitis   . COPD (chronic obstructive pulmonary disease) ([place])   . Ear infection   . GERD (gastroesophageal reflux disease)   . Salmonella   . Sinus infection    Past Surgical History:  Procedure Laterality Date  . IR RADIOLOGIST EVAL & MGMT  05/07/2019  . IR RADIOLOGIST EVAL & MGMT  05/27/2019   Family History  Problem Relation Age of Onset  . Hyperlipidemia Father   . Stroke Father   . Alcohol abuse Father   . Throat cancer Brother   . Lymphoma Brother    Social History   Socioeconomic History  . Marital status: Married    Spouse name: Not on file  . Number of children: Not on file  . Years of education: Not on file  . Highest education level: Not on file  Occupational History  . Not on  file  Tobacco Use  . Smoking status: Current Every Day Smoker    Packs/day: 1.00    Years: 55.00    Pack years: 55.00  . Smokeless tobacco: Never Used  . Tobacco comment: down to 0.5ppd  Substance and Sexual Activity  . Alcohol use: Never  . Drug use: No  . Sexual activity: Not Currently    Birth control/protection: None  Other Topics Concern  . Not on file  Social History Narrative  . Not on file   Social Determinants of Health   Financial Resource Strain:   . Difficulty of Paying  Living Expenses:   Food Insecurity:   . Worried About Charity fundraiser in the Last Year:   . Arboriculturist in the Last Year:   Transportation Needs:   . Film/video editor (Medical):   Marland Kitchen Lack of Transportation (Non-Medical):   Physical Activity:   . Days of Exercise per Week:   . Minutes of Exercise per Session:   Stress:   . Feeling of Stress :   Social Connections:   . Frequency of Communication with Friends and Family:   . Frequency of Social Gatherings with Friends and Family:   . Attends Religious Services:   . Active Member of Clubs or Organizations:   . Attends Archivist Meetings:   Marland Kitchen Marital Status:     Outpatient Encounter Medications as of 10/09/2019  Medication Sig  . acetaminophen (TYLENOL) 500 MG tablet Take 1,000 mg by mouth every 6 (six) hours as needed. For pain  . ADVAIR DISKUS 250-50 MCG/DOSE AEPB INHALE 1 DOSE BY MOUTH TWICE DAILY  . ammonium lactate (AMLACTIN) 12 % cream APPLY  CREAM TOPICALLY AS NEEDED FOR DRY SKIN  TWICE DAILY  . azelastine (ASTELIN) 0.1 % nasal spray Place 2 sprays into both nostrils 2 (two) times daily. Use in each nostril as directed  . busPIRone (BUSPAR) 7.5 MG tablet Take 1 tablet (7.5 mg total) by mouth 2 (two) times daily.  . clotrimazole-betamethasone (LOTRISONE) cream Apply 1 application topically 2 (two) times daily.  Marland Kitchen doxazosin (CARDURA) 4 MG tablet Take 4 mg by mouth daily.  . meloxicam (MOBIC) 7.5 MG tablet Take 1 tablet by mouth once daily  . mupirocin ointment (BACTROBAN) 2 % Place 1 application into the nose 2 (two) times daily.  Marland Kitchen albuterol (PROVENTIL HFA;VENTOLIN HFA) 108 (90 Base) MCG/ACT inhaler Inhale 2 puffs into the lungs every 6 (six) hours as needed for wheezing or shortness of breath. (Patient not taking: Reported on 09/17/2019)  . famotidine (PEPCID) 20 MG tablet Take 1 tablet (20 mg total) by mouth 2 (two) times daily. (Patient not taking: Reported on 10/09/2019)  . finasteride (PROSCAR) 5 MG tablet  Take 5 mg by mouth daily. (Patient not taking: Reported on 10/09/2019)  . fluticasone (FLONASE) 50 MCG/ACT nasal spray Place 2 sprays into both nostrils daily. (Patient not taking: Reported on 09/17/2019)  . fluticasone (FLONASE) 50 MCG/ACT nasal spray Place 2 sprays into both nostrils daily. (Patient not taking: Reported on 09/17/2019)  . ondansetron (ZOFRAN ODT) 8 MG disintegrating tablet Take 1 tablet (8 mg total) by mouth every 8 (eight) hours as needed for nausea or vomiting. (Patient not taking: Reported on 09/17/2019)  . [DISCONTINUED] azithromycin (ZITHROMAX) 250 MG tablet Take 2 tablets by mouth on day 1, followed by 1 tablet by mouth daily for 4 days. (Patient not taking: Reported on 09/17/2019)  . [DISCONTINUED] benzonatate (TESSALON) 100 MG capsule Take 1 capsule (  100 mg total) by mouth 3 (three) times daily as needed for cough.  . [DISCONTINUED] doxycycline (VIBRA-TABS) 100 MG tablet Take 1 tablet (100 mg total) by mouth 2 (two) times daily. Can give caps or generic. (Patient not taking: Reported on 09/17/2019)  . [DISCONTINUED] meloxicam (MOBIC) 7.5 MG tablet Take 1 tablet (7.5 mg total) by mouth daily.  . [DISCONTINUED] sulfamethoxazole-trimethoprim (BACTRIM DS) 800-160 MG tablet Take 1 tablet by mouth 2 (two) times daily. (Patient not taking: Reported on 09/17/2019)   No facility-administered encounter medications on file as of 10/09/2019.    Activities of Daily Living In your present state of health, do you have any difficulty performing the following activities: 10/09/2019  Vision? N  Difficulty concentrating or making decisions? N  Walking or climbing stairs? N  Dressing or bathing? N  Doing errands, shopping? N  Preparing Food and eating ? N  Using the Toilet? N  In the past six months, have you accidently leaked urine? N  Do you have problems with loss of bowel control? N  Managing your Medications? N  Managing your Finances? N  Housekeeping or managing your Housekeeping? N    Some recent data might be hidden    Patient Care Team: Saguier, Iris Pert as PCP - General (Physician Assistant)   Assessment:   This is a routine wellness examination for [place].  Exercise Activities and Dietary recommendations Current Exercise Habits: The patient does not participate in regular exercise at present, Exercise limited by: None identified  Goals Addressed            This Visit's Progress   . Patient Stated       Walking more       Fall Risk: Fall Risk  10/09/2019 08/07/2017 03/02/2015 02/24/2014 02/24/2014  Falls in the past year? 0 Yes No No No  Number falls in past yr: 0 1 - - -  Injury with Fall? 0 Yes - - -  Follow up - Falls prevention discussed;Education provided - - -    FALL RISK PREVENTION PERTAINING TO THE HOME:  Any stairs in or around the home? No    Home free of loose throw rugs in walkways, pet beds, electrical cords, etc? Yes  Adequate lighting in your home to reduce risk of falls? Yes   ASSISTIVE DEVICES UTILIZED TO PREVENT FALLS:  Life alert? No  Use of a cane, walker or w/c? No  Grab bars in the bathroom? Yes  Shower chair or bench in shower? No  Elevated toilet seat or a handicapped toilet? No   TIMED UP AND GO:  Was the test performed? No . Virtual Visit   Depression Screen PHQ 2/9 Scores 10/09/2019 08/07/2017 02/26/2017 03/02/2015  PHQ - 2 Score 0 0 2 0  PHQ- 9 Score - - 2 -    Cognitive Function MMSE - Mini Mental State Exam 08/07/2017  Orientation to time 5  Orientation to Place 5  Registration 3  Attention/ Calculation 4  Recall 2  Language- name 2 objects 2  Language- repeat 1  Language- follow 3 step command 3  Language- read & follow direction 1  Write a sentence 1  Copy design 1  Total score 28        Immunization History  Administered Date(s) Administered  . Influenza, High Dose Seasonal PF 02/26/2017, 03/19/2018  . Influenza,inj,Quad PF,6+ Mos 01/22/2014  . Pneumococcal Conjugate-13 01/25/2014  .  Pneumococcal Polysaccharide-23 02/26/2017    Qualifies for Shingles Vaccine? Yes  Due for Shingrix. Education has been provided regarding the importance of this vaccine. Pt has been advised to call insurance company to determine out of pocket expense. Advised may also receive vaccine at local pharmacy or Health Dept. Verbalized acceptance and understanding.  Tdap: Although this vaccine is not a covered service during a Wellness Exam, does the patient still wish to receive this vaccine today?  No . Virtual visit Education has been provided regarding the importance of this vaccine. Advised may receive this vaccine at local pharmacy or Health Dept. Aware to provide a copy of the vaccination record if obtained from local pharmacy or Health Dept. Verbalized acceptance and understanding.  Flu Vaccine: Due 12/2019  Pneumococcal Vaccine:  Completed Prevnar 13-9/27/2015 Completed Pneumovax 23-10/29/2018  Covid-19 Vaccine:  Information provided  Screening Tests Health Maintenance  Topic Date Due  . Hepatitis C Screening  Never done  . COVID-19 Vaccine (1) Never done  . TETANUS/TDAP  Never done  . INFLUENZA VACCINE  11/30/2019  . COLONOSCOPY  02/25/2024  . PNA vac Low Risk Adult  Completed   Cancer Screenings:  Colorectal Screening: Completed 02/24/2014. Repeat every 10 years.  Lung Cancer Screening: (Low Dose CT Chest recommended if Age 52-80 years, 30 pack-year currently smoking OR have quit w/in 15years.) does qualify.   Lung Cancer Screening Referral: Discuss with PCP at next visit Additional Screening:  Hepatitis C Screening: does qualify; Discuss with PCP at next visit  Vision Screening: Recommended annual ophthalmology exams for early detection of glaucoma and other disorders of the eye. Is the patient up to date with their annual eye exam?  No  Who is the provider or what is the name of the office in which the pt attends annual eye exams?  Unsure   Dental Screening:  Recommended annual dental exams for proper oral hygiene  Community Resource Referral:  CRR required this visit?  No        Plan:  I have personally reviewed and addressed the Medicare Annual Wellness questionnaire and have noted the following in the patient's chart:  A. Medical and social history B. Use of alcohol, tobacco or illicit drugs  C. Current medications and supplements D. Functional ability and status E.  Nutritional status F.  Physical activity G. Advance directives H. List of other physicians I.  Hospitalizations, surgeries, and ER visits in previous 12 months J.  [place] such as hearing and vision if needed, cognitive and depression L. Referrals and appointments   In addition, I have reviewed and discussed with patient certain preventive protocols, quality metrics, and best practice recommendations. A written personalized care plan for preventive services as well as general preventive health recommendations were provided to patient.  Due to this being a telephonic visit, the after visit summary with patients personalized plan was offered to patient via mail or my-chart. Per request, patient was mailed a copy of AVS.  Signed,   Marta Antu, LPN  [id number] Nurse Health Advisor   Nurse Notes: Patient requesting a refill of Meloxicam. Message sent to [facility].

## 2019-10-09 ENCOUNTER — Encounter: Payer: Self-pay | Admitting: *Deleted

## 2019-10-09 ENCOUNTER — Ambulatory Visit (INDEPENDENT_AMBULATORY_CARE_PROVIDER_SITE_OTHER): Payer: Medicare Other | Admitting: *Deleted

## 2019-10-09 ENCOUNTER — Other Ambulatory Visit: Payer: Self-pay

## 2019-10-09 VITALS — Ht 68.0 in | Wt 185.0 lb

## 2019-10-09 DIAGNOSIS — Z0000 Encounter for general adult medical examination without abnormal findings: Secondary | ICD-10-CM

## 2019-10-09 NOTE — Patient Instructions (Signed)
Patrick Morris , Thank you for taking time to come for your Medicare Wellness Visit. I appreciate your ongoing commitment to your health goals. Please review the following plan we discussed and let me know if I can assist you in the future.   Screening recommendations/referrals: Colonoscopy:Completed 02/24/2014 Recommended yearly ophthalmology/optometry visit for glaucoma screening and checkup Recommended yearly dental visit for hygiene and checkup  Vaccinations: Influenza vaccine: Due 12/2019 Pneumococcal vaccine: Completed 02/26/2017 Tdap vaccine: Due-Discuss with pharmacy/insurance Shingles vaccine: Discuss with pharmacy/insurance   Covid-19: Discussed. Check with pharmacy when ready to receive vaccine.  Advanced directives: Information mailed  Conditions/risks identified: See problem list  Next appointment: Follow up in one year for your annual wellness visit.   Preventive Care 32 Years and Older, Male Preventive care refers to lifestyle choices and visits with your health care provider that can promote health and wellness. What does preventive care include?  A yearly physical exam. This is also called an annual well check.  Dental exams once or twice a year.  Routine eye exams. Ask your health care provider how often you should have your eyes checked.  Personal lifestyle choices, including:  Daily care of your teeth and gums.  Regular physical activity.  Eating a healthy diet.  Avoiding tobacco and drug use.  Limiting alcohol use.  Practicing safe sex.  Taking low doses of aspirin every day.  Taking vitamin and mineral supplements as recommended by your health care provider. What happens during an annual well check? The services and screenings done by your health care provider during your annual well check will depend on your age, overall health, lifestyle risk factors, and family history of disease. Counseling  Your health care provider may ask you questions about  your:  Alcohol use.  Tobacco use.  Drug use.  Emotional well-being.  Home and relationship well-being.  Sexual activity.  Eating habits.  History of falls.  Memory and ability to understand (cognition).  Work and work Statistician. Screening  You may have the following tests or measurements:  Height, weight, and BMI.  Blood pressure.  Lipid and cholesterol levels. These may be checked every 5 years, or more frequently if you are over 65 years old.  Skin check.  Lung cancer screening. You may have this screening every year starting at age 20 if you have a 30-pack-year history of smoking and currently smoke or have quit within the past 15 years.  Fecal occult blood test (FOBT) of the stool. You may have this test every year starting at age 38.  Flexible sigmoidoscopy or colonoscopy. You may have a sigmoidoscopy every 5 years or a colonoscopy every 10 years starting at age 36.  Prostate cancer screening. Recommendations will vary depending on your family history and other risks.  Hepatitis C blood test.  Hepatitis B blood test.  Sexually transmitted disease (STD) testing.  Diabetes screening. This is done by checking your blood sugar (glucose) after you have not eaten for a while (fasting). You may have this done every 1-3 years.  Abdominal aortic aneurysm (AAA) screening. You may need this if you are a current or former smoker.  Osteoporosis. You may be screened starting at age 85 if you are at high risk. Talk with your health care provider about your test results, treatment options, and if necessary, the need for more tests. Vaccines  Your health care provider may recommend certain vaccines, such as:  Influenza vaccine. This is recommended every year.  Tetanus, diphtheria, and acellular pertussis (Tdap,  Td) vaccine. You may need a Td booster every 10 years.  Zoster vaccine. You may need this after age 62.  Pneumococcal 13-valent conjugate (PCV13) vaccine.  One dose is recommended after age 37.  Pneumococcal polysaccharide (PPSV23) vaccine. One dose is recommended after age 52. Talk to your health care provider about which screenings and vaccines you need and how often you need them. This information is not intended to replace advice given to you by your health care provider. Make sure you discuss any questions you have with your health care provider. Document Released: 05/14/2015 Document Revised: 01/05/2016 Document Reviewed: 02/16/2015 Elsevier Interactive Patient Education  2017 [employer] Prevention in the Home Falls can cause injuries. They can happen to people of all ages. There are many things you can do to make your home safe and to help prevent falls. What can I do on the outside of my home?  Regularly fix the edges of walkways and driveways and fix any cracks.  Remove anything that might make you trip as you walk through a door, such as a raised step or threshold.  Trim any bushes or trees on the path to your home.  Use bright outdoor lighting.  Clear any walking paths of anything that might make someone trip, such as rocks or tools.  Regularly check to see if handrails are loose or broken. Make sure that both sides of any steps have handrails.  Any raised decks and porches should have guardrails on the edges.  Have any leaves, snow, or ice cleared regularly.  Use sand or salt on walking paths during winter.  Clean up any spills in your garage right away. This includes oil or grease spills. What can I do in the bathroom?  Use night lights.  Install grab bars by the toilet and in the tub and shower. Do not use towel bars as grab bars.  Use non-skid mats or decals in the tub or shower.  If you need to sit down in the shower, use a plastic, non-slip stool.  Keep the floor dry. Clean up any water that spills on the floor as soon as it happens.  Remove soap buildup in the tub or shower regularly.  Attach bath  mats securely with double-sided non-slip rug tape.  Do not have throw rugs and other things on the floor that can make you trip. What can I do in the bedroom?  Use night lights.  Make sure that you have a light by your bed that is easy to reach.  Do not use any sheets or blankets that are too big for your bed. They should not hang down onto the floor.  Have a firm chair that has side arms. You can use this for support while you get dressed.  Do not have throw rugs and other things on the floor that can make you trip. What can I do in the kitchen?  Clean up any spills right away.  Avoid walking on wet floors.  Keep items that you use a lot in easy-to-reach places.  If you need to reach something above you, use a strong step stool that has a grab bar.  Keep electrical cords out of the way.  Do not use floor polish or wax that makes floors slippery. If you must use wax, use non-skid floor wax.  Do not have throw rugs and other things on the floor that can make you trip. What can I do with my stairs?  Do not  leave any items on the stairs.  Make sure that there are handrails on both sides of the stairs and use them. Fix handrails that are broken or loose. Make sure that handrails are as long as the stairways.  Check any carpeting to make sure that it is firmly attached to the stairs. Fix any carpet that is loose or worn.  Avoid having throw rugs at the top or bottom of the stairs. If you do have throw rugs, attach them to the floor with carpet tape.  Make sure that you have a light switch at the top of the stairs and the bottom of the stairs. If you do not have them, ask someone to add them for you. What else can I do to help prevent falls?  Wear shoes that:  Do not have high heels.  Have rubber bottoms.  Are comfortable and fit you well.  Are closed at the toe. Do not wear sandals.  If you use a stepladder:  Make sure that it is fully opened. Do not climb a closed  stepladder.  Make sure that both sides of the stepladder are locked into place.  Ask someone to hold it for you, if possible.  Clearly mark and make sure that you can see:  Any grab bars or handrails.  First and last steps.  Where the edge of each step is.  Use tools that help you move around (mobility aids) if they are needed. These include:  Canes.  Walkers.  Scooters.  Crutches.  Turn on the lights when you go into a dark area. Replace any light bulbs as soon as they burn out.  Set up your furniture so you have a clear path. Avoid moving your furniture around.  If any of your floors are uneven, fix them.  If there are any pets around you, be aware of where they are.  Review your medicines with your doctor. Some medicines can make you feel dizzy. This can increase your chance of falling. Ask your doctor what other things that you can do to help prevent falls. This information is not intended to replace advice given to you by your health care provider. Make sure you discuss any questions you have with your health care provider. Document Released: 02/11/2009 Document Revised: 09/23/2015 Document Reviewed: 05/22/2014 Elsevier Interactive Patient Education  2017 [employer].

## 2019-10-10 ENCOUNTER — Other Ambulatory Visit: Payer: Self-pay | Admitting: Medical

## 2019-10-23 ENCOUNTER — Ambulatory Visit
Admission: RE | Admit: 2019-10-23 | Discharge: 2019-10-23 | Disposition: A | Discharge status: Home / Self Care | Payer: Medicare Other | Source: Ambulatory Visit | Attending: Interventional Radiology | Admitting: Interventional Radiology

## 2019-10-23 ENCOUNTER — Encounter: Payer: Self-pay | Admitting: *Deleted

## 2019-10-23 ENCOUNTER — Other Ambulatory Visit: Payer: Self-pay

## 2019-10-23 ENCOUNTER — Other Ambulatory Visit: Payer: Self-pay | Admitting: Medical

## 2019-10-23 DIAGNOSIS — N2889 Other specified disorders of kidney and ureter: Secondary | ICD-10-CM

## 2019-10-23 HISTORY — PX: IR RADIOLOGIST EVAL & MGMT: IMG5224

## 2019-10-23 NOTE — Progress Notes (Signed)
Patient ID: Patrick Morris, male   DOB: Feb 25, 1951, 69 y.o.   MRN: [id number]         Chief Complaint: Indeterminate right sided renal lesion  Referring Physician(s): Eskew  History of Present Illness: Patrick Morris is a 69 y.o. male with past medical history significant for BPH and COPD who was found to have a slowly enlarging lesion involving the superior pole of his right kidney on surveillance renal ultrasound performed 04/14/2019.    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  The patient was subsequently seen in repeat consultation on 05/27/2019 at which time, the patient wished to pursue conservative management as we were in the midst of the Covid pandemic surge and as many elective procedures requiring admission are being postponed earlier this year.  As such, patient is seen in repeat consultation today via telemedicine following acquisition of surveillance renal protocol CT scan performed 09/10/2019.  Note, CT scan was obtained in lieu of MRI as patient tolerated the MRI poorly given required breath holds.  The patient is asymptomatic in regards to this incidentally discovered right-sided renal lesion.  Specifically, no flank pain or hematuria.  No unintentional weight loss.  No change in appetite or energy level.  Patient has yet to receive his COVID-19 vaccine.   Past Medical History:  Diagnosis Date  . BPH (benign prostatic hyperplasia)    his specialist is recommending surgery.  . Cataract   . Colitis   . COPD (chronic obstructive pulmonary disease) ([place])   . Ear infection   . GERD  (gastroesophageal reflux disease)   . Salmonella   . Sinus infection     Past Surgical History:  Procedure Laterality Date  . IR RADIOLOGIST EVAL & MGMT  05/07/2019  . IR RADIOLOGIST EVAL & MGMT  05/27/2019    Allergies: Aspirin  Medications: Prior to Admission medications   Medication Sig Start Date End Date Taking? Authorizing Provider  acetaminophen (TYLENOL) 500 MG tablet Take 1,000 mg by mouth every 6 (six) hours as needed. For pain    [provider]  ADVAIR DISKUS 250-50 MCG/DOSE AEPB INHALE 1 DOSE BY MOUTH TWICE DAILY 04/11/19   Saguier, Percell Miller, PA-C  albuterol (PROVENTIL HFA;VENTOLIN HFA) 108 (90 Base) MCG/ACT inhaler Inhale 2 puffs into the lungs every 6 (six) hours as needed for wheezing or shortness of breath. Patient not taking: Reported on 09/17/2019 07/13/15   Saguier, Percell Miller, PA-C  ammonium lactate (AMLACTIN) 12 % cream APPLY  CREAM TOPICALLY AS NEEDED FOR DRY SKIN  TWICE DAILY 11/11/18   Saguier, Percell Miller, PA-C  azelastine (ASTELIN) 0.1 % nasal spray Place 2 sprays into both nostrils 2 (two) times daily. Use in each nostril as directed 08/07/17   Saguier, Percell Miller, PA-C  busPIRone (BUSPAR) 7.5 MG tablet Take 1 tablet (7.5 mg total) by mouth 2 (two) times daily. 09/17/19   Saguier, Percell Miller, PA-C  clotrimazole-betamethasone (LOTRISONE) cream Apply 1 application topically 2 (two) times daily. 05/07/16   Saguier, Percell Miller, PA-C  doxazosin (CARDURA) 4 MG tablet Take 4 mg by mouth daily.    [provider]  famotidine (PEPCID) 20 MG tablet Take 1 tablet (20 mg total) by mouth 2 (two)  times daily. Patient not taking: Reported on 10/09/2019 03/25/18   Saguier, Percell Miller, PA-C  finasteride (PROSCAR) 5 MG tablet Take 5 mg by mouth daily. Patient not taking: Reported on 10/09/2019    [provider]  fluticasone (FLONASE) 50 MCG/ACT nasal spray Place 2 sprays into both nostrils daily. Patient not taking: Reported on 09/17/2019 01/29/18   Saguier, Percell Miller, PA-C  fluticasone  [employer]) 50 MCG/ACT nasal spray Use 2 spray(s) in each nostril once daily 10/10/19   Saguier, Percell Miller, PA-C  meloxicam [facility]) 7.5 MG tablet Take 1 tablet by mouth once daily 10/09/19   Saguier, Percell Miller, PA-C  mupirocin ointment (BACTROBAN) 2 % Place 1 application into the nose 2 (two) times daily. 09/27/18   Saguier, Percell Miller, PA-C  ondansetron (ZOFRAN ODT) 8 MG disintegrating tablet Take 1 tablet (8 mg total) by mouth every 8 (eight) hours as needed for nausea or vomiting. Patient not taking: Reported on 09/17/2019 03/25/18   Saguier, Percell Miller, PA-C     Family History  Problem Relation Age of Onset  . Hyperlipidemia Father   . Stroke Father   . Alcohol abuse Father   . Throat cancer Brother   . Lymphoma Brother     Social History   Socioeconomic History  . Marital status: Married    Spouse name: Not on file  . Number of children: Not on file  . Years of education: Not on file  . Highest education level: Not on file  Occupational History  . Not on file  Tobacco Use  . Smoking status: Current Every Day Smoker    Packs/day: 1.00    Years: 55.00    Pack years: 55.00  . Smokeless tobacco: Never Used  . Tobacco comment: down to 0.5ppd  Substance and Sexual Activity  . Alcohol use: Never  . Drug use: No  . Sexual activity: Not Currently    Birth control/protection: None  Other Topics Concern  . Not on file  Social History Narrative  . Not on file   Social Determinants of Health   Financial Resource Strain: Low Risk   . Difficulty of Paying Living Expenses: Not hard at all  Food Insecurity: No Food Insecurity  . Worried About Charity fundraiser in the Last Year: Never true  . Ran Out of Food in the Last Year: Never true  Transportation Needs: No Transportation Needs  . Lack of Transportation (Medical): No  . Lack of Transportation (Non-Medical): No  Physical Activity: Inactive  . Days of Exercise per Week: 0 days  . Minutes of Exercise per Session: 0 min  Stress: No Stress  Concern Present  . Feeling of Stress : Not at all  Social Connections:   . Frequency of Communication with Friends and Family:   . Frequency of Social Gatherings with Friends and Family:   . Attends Religious Services:   . Active Member of Clubs or Organizations:   . Attends Archivist Meetings:   Marland Kitchen Marital Status:     ECOG Status: 0 - Asymptomatic  Review of Systems  Review of Systems: A 12 point ROS discussed and pertinent positives are indicated in the HPI above.  All other systems are negative.  Physical Exam No direct physical exam was performed (except for noted visual exam findings with Video Visits).   Vital Signs: There were no vitals taken for this visit.  Imaging:  Renal protocol CT scan - 09/10/2019; abdominal MRI - 05/21/2019.  Personal review of renal protocol CT scan demonstrates an  approximately 2.7 x 2.4 x 2.3 cm exophytic lesion arising from the posterior superior pole of the right kidney (sagittal image 37, series 12; coronal image 84, series 11 and axial image 28, series 7), unchanged to slightly increased in size compared to abdominal MRI performed 05/21/2019, previously, 2.6 cm in maximal diameter.   Labs:  CBC: No results for input(s): WBC, HGB, HCT, PLT in the last 8760 hours.  COAGS: No results for input(s): INR, APTT in the last 8760 hours.  BMP: No results for input(s): NA, K, CL, CO2, GLUCOSE, BUN, CALCIUM, CREATININE, GFRNONAA, GFRAA in the last 8760 hours.  Invalid input(s): CMP  LIVER FUNCTION TESTS: No results for input(s): BILITOT, AST, ALT, ALKPHOS, PROT, ALBUMIN in the last 8760 hours.  TUMOR MARKERS: No results for input(s): AFPTM, CEA, CA199, CHROMGRNA in the last 8760 hours.  Assessment and Plan:  Patrick Morris is a 69 y.o. male with past medical history significant for BPH and COPD who is seen today in repeat consultation today via telemedicine following acquisition of surveillance renal protocol CT scan performed  09/10/2019.  (Note, CT scan was obtained in lieu of MRI as patient tolerated the MRI poorly given required breath holds.  The patient is asymptomatic in regards to this incidentally discovered right-sided renal lesion.   Personal review of renal protocol CT scan demonstrates an approximately 2.7 x 2.4 x 2.3 cm exophytic lesion arising from the posterior superior pole of the right kidney (sagittal image 37, series 12; coronal image 84, series 11 and axial image 28, series 7), unchanged to slightly increased in size compared to abdominal MRI performed 05/21/2019, previously, 2.6 cm in maximal diameter.  Prolonged conversations were again held with the patient regarding potential treatment options including continued conservative management (note, remote CT scan of the abdomen and pelvis performed 01/13/2012 demonstrated a tiny (approximately0.5 cm) lesion at this location which would suggest a growth rate of approximately 2 mm/year),surgical resection (partial or complete nephrectomy) versus renal cryoablation.  I explained that based on the size (less than 3 cm) of the solitary worrisome right-sided renal lesion, the patient is a candidate for image guided cryoablation though the lesion's locationdoes prove difficult for percutaneous management given its proximity to the posterior medial aspect of the right hemidiaphragm and associated interposed lung.  Conversations were held with the patient regarding the risks and benefits of image guided renal cryoablationincluding, but not limited to, failure to treat entire lesion, bleeding, infection, damage to adjacent structures (including the lung, potentially requiring chest tube placement), hematuria, urine leak, decrease in renal function or post procedural neuropathy.I explained that the procedure entails general anesthesia as well as an overnight admission for continued observation and PCA usage.  Ultimately, following the above prolonged and detailed  conversation, the patient has again decided to pursue continued conservative management/surveillance at this time.    While I would prefer to proceed with cryoablation and potential biopsy, the patient states that this is a busy time of year for him as he has to be on site for selling of watermelons.  Again I stressed it is ideal to treat lesions that are less than 3 cm and as the lesion currently measures 2.7 cm in maximal diameter, I would perform a follow-up scan in 3 months to minimize the chances of missing our treatment window.  PLAN: - Repeat consultation (ideally in person) following acquisition of follow-up renal protocol CT scan of the abdomen and pelvis in mid August 2021  Note, the patient has yet  to receive his COVID-19 vaccine and was encouraged to do so in the interval.  A copy of this report was sent to the requesting provider on this date.  Electronically Signed: Sandi Mariscal 10/23/2019, 12:44 PM   I spent a total of 15 Minutes in remote  clinical consultation, greater than 50% of which was counseling/coordinating care for indeterminate right-sided renal lesion.    Visit type: Audio only (telephone). Audio (no video) only due to patient's lack of internet/smartphone capability. Alternative for in-person consultation at [facility], [address] [place], [place], [place]. This visit type was conducted due to national recommendations for restrictions regarding the COVID-19 Pandemic (e.g. social distancing).  This format is felt to be most appropriate for this patient at this time.  All issues noted in this document were discussed and addressed.

## 2019-11-07 ENCOUNTER — Other Ambulatory Visit: Payer: Self-pay | Admitting: Medical

## 2019-11-12 ENCOUNTER — Other Ambulatory Visit: Payer: Self-pay | Admitting: Medical

## 2019-11-17 ENCOUNTER — Other Ambulatory Visit: Payer: Self-pay | Admitting: Interventional Radiology

## 2019-11-17 ENCOUNTER — Other Ambulatory Visit: Payer: Self-pay

## 2019-11-17 DIAGNOSIS — N2889 Other specified disorders of kidney and ureter: Secondary | ICD-10-CM

## 2019-12-08 ENCOUNTER — Other Ambulatory Visit: Payer: Self-pay | Admitting: Medical

## 2019-12-14 ENCOUNTER — Other Ambulatory Visit: Payer: Self-pay | Admitting: Medical

## 2019-12-25 ENCOUNTER — Other Ambulatory Visit: Payer: Medicare Other

## 2019-12-25 ENCOUNTER — Telehealth: Payer: Medicare Other

## 2020-01-15 ENCOUNTER — Other Ambulatory Visit: Payer: Self-pay

## 2020-01-15 ENCOUNTER — Ambulatory Visit
Admission: RE | Admit: 2020-01-15 | Discharge: 2020-01-15 | Disposition: A | Discharge status: Home / Self Care | Payer: Medicare Other | Source: Ambulatory Visit | Attending: Interventional Radiology | Admitting: Interventional Radiology

## 2020-01-15 ENCOUNTER — Encounter: Payer: Self-pay | Admitting: *Deleted

## 2020-01-15 DIAGNOSIS — N2889 Other specified disorders of kidney and ureter: Secondary | ICD-10-CM

## 2020-01-15 HISTORY — PX: IR RADIOLOGIST EVAL & MGMT: IMG5224

## 2020-01-15 NOTE — Progress Notes (Signed)
Patient ID: Patrick Morris, male   DOB: 01/27/1951, 69 y.o.   MRN: [id number]         Chief Complaint: Indeterminate right sided renal lesion  Referring Physician(s): Eskew  History of Present Illness: Patrick Morris is a 69 y.o. 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  Patient was last seen in consultation on 05/27/2019 at which time the patient elected to pursue conservative management as such, is seen today in telemedicine consultation following acquisition of surveillance renal protocol CT scan performed 12/16/2019.  The patient remains asymptomatic in regards to this incidentally discovered right-sided renal lesion.  Specifically, no flank pain or hematuria.  No unintentional weight loss.  No change in appetite or energy level.    Past Medical History:  Diagnosis Date  . BPH (benign prostatic hyperplasia)    his specialist is recommending surgery.  . Cataract   . Colitis   . COPD (chronic obstructive pulmonary disease) ([place])   . Ear infection   . GERD (gastroesophageal reflux disease)   . Salmonella   . Sinus infection     Past Surgical History:  Procedure Laterality Date  . IR RADIOLOGIST EVAL & MGMT  05/07/2019  . IR RADIOLOGIST EVAL & MGMT  05/27/2019  . IR RADIOLOGIST EVAL & MGMT  10/23/2019    Allergies: Aspirin  Medications: Prior to Admission medications   Medication Sig Start Date End Date Taking? Authorizing Provider  acetaminophen (TYLENOL) 500 MG tablet Take 1,000 mg by mouth every 6 (six) hours as needed. For pain    [provider]  ADVAIR DISKUS 250-50 MCG/DOSE AEPB INHALE 1 DOSE BY MOUTH TWICE DAILY 12/15/19   Copland, Gay Filler, MD  albuterol (PROVENTIL HFA;VENTOLIN HFA) 108 (90 Base)  MCG/ACT inhaler Inhale 2 puffs into the lungs every 6 (six) hours as needed for wheezing or shortness of breath. Patient not taking: Reported on 09/17/2019 07/13/15   Saguier, Percell Miller, PA-C  ammonium lactate (AMLACTIN) 12 % cream APPLY  CREAM TOPICALLY AS NEEDED FOR DRY SKIN  TWICE DAILY 11/11/18   Saguier, Percell Miller, PA-C  azelastine (ASTELIN) 0.1 % nasal spray Place 2 sprays into both nostrils 2 (two) times daily. Use in each nostril as directed 08/07/17   Saguier, Percell Miller, PA-C  busPIRone (BUSPAR) 7.5 MG tablet Take 1 tablet by mouth twice daily 12/09/19   Saguier, Percell Miller, PA-C  clotrimazole-betamethasone (LOTRISONE) cream Apply 1 application topically 2 (two) times daily. 05/07/16   Saguier, Percell Miller, PA-C  doxazosin (CARDURA) 4 MG tablet Take 4 mg by mouth daily.    [provider]  famotidine (PEPCID) 20 MG tablet Take 1 tablet (20 mg total) by mouth 2 (two) times daily. Patient not taking: Reported on 10/09/2019 03/25/18   Saguier, Percell Miller, PA-C  finasteride (PROSCAR) 5 MG tablet Take 5 mg by mouth daily. Patient not taking: Reported on 10/09/2019    [provider]  fluticasone (FLONASE) 50 MCG/ACT nasal spray Place 2 sprays into both nostrils daily. Patient not taking: Reported on 09/17/2019 01/29/18   Saguier, Percell Miller, PA-C  fluticasone [facility]) 50 MCG/ACT nasal spray Use 2 spray(s) in each nostril once daily 10/10/19   Saguier, Percell Miller, PA-C  meloxicam [facility]) 7.5 MG tablet Take 1 tablet by mouth once daily 12/09/19   Saguier, Percell Miller, PA-C  mupirocin ointment (BACTROBAN) 2 % Place 1 application into the nose 2 (two) times daily. 09/27/18   Saguier, Percell Miller, PA-C  ondansetron (ZOFRAN ODT) 8 MG disintegrating tablet Take 1 tablet (8 mg total) by mouth every 8 (eight) hours as needed for nausea or vomiting. Patient not taking: Reported on 09/17/2019 03/25/18   Saguier, Percell Miller, PA-C     Family History  Problem Relation Age of Onset  . Hyperlipidemia Father   . Stroke Father   . Alcohol abuse  Father   . Throat cancer Brother   . Lymphoma Brother     Social History   Socioeconomic History  . Marital status: Married    Spouse name: Not on file  . Number of children: Not on file  . Years of education: Not on file  . Highest education level: Not on file  Occupational History  . Not on file  Tobacco Use  . Smoking status: Current Every Day Smoker    Packs/day: 1.00    Years: 55.00    Pack years: 55.00  . Smokeless tobacco: Never Used  . Tobacco comment: down to 0.5ppd  Substance and Sexual Activity  . Alcohol use: Never  . Drug use: No  . Sexual activity: Not Currently    Birth control/protection: None  Other Topics Concern  . Not on file  Social History Narrative  . Not on file   Social Determinants of Health   Financial Resource Strain: Low Risk   . Difficulty of Paying Living Expenses: Not hard at all  Food Insecurity: No Food Insecurity  . Worried About Charity fundraiser in the Last Year: Never true  . Ran Out of Food in the Last Year: Never true  Transportation Needs: No Transportation Needs  . Lack of Transportation (Medical): No  . Lack of Transportation (Non-Medical): No  Physical Activity: Inactive  . Days of Exercise per Week: 0 days  . Minutes of Exercise per Session: 0 min  Stress: No Stress Concern Present  . Feeling of Stress : Not at all  Social Connections:   . Frequency of Communication with Friends and Family: Not on file  . Frequency of Social Gatherings with Friends and Family: Not on file  . Attends Religious Services: Not on file  . Active Member of Clubs or Organizations: Not on file  . Attends Archivist Meetings: Not on file  . Marital Status: Not on file    ECOG Status: 0 - Asymptomatic  Review of Systems  Review of Systems: A 12 point ROS discussed and pertinent positives are indicated in the HPI above.  All other systems are negative.  Physical Exam No direct physical exam was performed (except for noted  visual exam findings with Video Visits).   Vital Signs: There were no vitals taken for this visit.  Imaging:  Personal review of the following examinations demonstrates the exophytic lesion arising from the superior medial aspect of the right kidney with measurements as follows: CT abdomen pelvis - 01/13/2012 - 0.5 cm Abdominal MRI - 05/21/2019 - 2.4 x 2.4 x 2.1 cm. CT abdomen pelvis - 09/09/2019 - 2.4 x 2.3 x 2.3 cm CT abdomen pelvis - 12/17/2019 - 2.7 x 2.5 x 2.4 cm  Estimated growth rate of a little greater than 2 mm/year.  Labs:  CBC: No results for input(s): WBC, HGB, HCT, PLT in the last 8760 hours.  COAGS: No results for input(s): INR, APTT in the last 8760 hours.  BMP: No results for  input(s): NA, K, CL, CO2, GLUCOSE, BUN, CALCIUM, CREATININE, GFRNONAA, GFRAA in the last 8760 hours.  Invalid input(s): CMP  LIVER FUNCTION TESTS: No results for input(s): BILITOT, AST, ALT, ALKPHOS, PROT, ALBUMIN in the last 8760 hours.  TUMOR MARKERS: No results for input(s): AFPTM, CEA, CA199, CHROMGRNA in the last 8760 hours.  Assessment and Plan:  DUFF POZZI is a 69 y.o. 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  The patient remains asymptomatic in regards to this incidentally discovered right-sided renal lesion.   Personal review of the following examinations demonstrates an exophytic lesion arising from the superior medial aspect of the right kidney with measurements as follows: CT abdomen pelvis - 01/13/2012 - 0.5 cm Abdominal MRI - 05/21/2019 - 2.4 x 2.4 x 2.1 cm. CT abdomen pelvis - 09/09/2019 - 2.4 x 2.3 x 2.3 cm CT abdomen pelvis - 12/17/2019 - 2.7 x 2.5 x 2.4 cm  Estimated growth rate of slightly greater than 2  mm/year.  Prolonged conversations were held with the patient regarding potential treatment options including continued conservative management surgical resection (partial or complete nephrectomy) versus renal cryoablation.  I explained that based on the size (less than 3 cm) of the solitary worrisome right-sided renal lesion, the patient is a candidate for potential image guided cryoablation though the lesion's locationdoes prove difficult for percutaneous management given its proximity to the posterior medial aspect of the right hemidiaphragm and associated interposed lung.  Conversations were held with the patient regarding the risks and benefits of image guided renal cryoablationincluding, but not limited to, failure to treat entire lesion, bleeding, infection, damage to adjacent structures (including the lung, potentially requiring chest tube placement), hematuria, urine leak, decrease in renal function or post procedural neuropathy.I explained that the procedure entails general anesthesia as well as an overnight admission for continued observation and PCA usage.  I emphasized that as the maximal diameter of the lesion is approaching 3 cm, our opportunity for performing a cryoablation may be closing.  I also explained that as we remain in the midst of the Covid pandemic, many elective procedures requiring admission are being postponed however if we are able to schedule the procedure with an overnight admission I would recommend doing so in leiu of continued surveillance.  Ultimately, following the above prolonged and detailed conversation, the patient has decided to pursue renal cryoablation and attempted biopsy.  Plan: -Schedule right-sided renal cryoablation and potential biopsy at [facility] hospital.  Again, the procedure will be scheduled based on anesthesia's availability as well as the hospital's policy regarding allowing procedures which require an overnight admission to  be performed given the Covid pandemic.    Ultimately, if the procedure is unable to be performed given restrictions, would recommend obtaining a renal protocol CT scan of the abdomen and pelvis in 3 months (mid November 2021).  Again, CT scan will be performed in lieu of MRI as patient has tolerated MRIs poorly in the past.  The patient demonstrated fair understanding of the above conversation and is in agreement with the proposed plan of care.  The patient knows to call the interventional radiology clinic with any interval questions or concerns.  A copy of this report was sent to the requesting provider on this date.  Electronically Signed:  Sandi Mariscal 01/15/2020, 12:54 PM   I spent a total of 25 Minutes in remote  clinical consultation, greater than 50% of which was counseling/coordinating care for right-sided renal lesion.    Visit type: Audio only (telephone). Audio (no video) only due to patient's lack of internet/smartphone capability. Alternative for in-person consultation at [facility], [address] [place], [place], [place]. This visit type was conducted due to national recommendations for restrictions regarding the COVID-19 Pandemic (e.g. social distancing).  This format is felt to be most appropriate for this patient at this time.  All issues noted in this document were discussed and addressed.

## 2020-01-19 ENCOUNTER — Other Ambulatory Visit: Payer: Self-pay | Admitting: Medical

## 2020-02-08 ENCOUNTER — Other Ambulatory Visit: Payer: Self-pay | Admitting: Medical

## 2020-02-19 ENCOUNTER — Other Ambulatory Visit: Payer: Self-pay | Admitting: Urology

## 2020-02-27 ENCOUNTER — Other Ambulatory Visit: Payer: Self-pay | Admitting: Medical

## 2020-03-03 ENCOUNTER — Ambulatory Visit (INDEPENDENT_AMBULATORY_CARE_PROVIDER_SITE_OTHER): Payer: Medicare Other | Admitting: Medical

## 2020-03-03 ENCOUNTER — Telehealth: Payer: Self-pay | Admitting: Medical

## 2020-03-03 ENCOUNTER — Encounter: Payer: Self-pay | Admitting: Medical

## 2020-03-03 ENCOUNTER — Other Ambulatory Visit: Payer: Self-pay

## 2020-03-03 VITALS — BP 145/88 | HR 55 | Resp 20 | Ht 68.0 in | Wt 184.0 lb

## 2020-03-03 DIAGNOSIS — R002 Palpitations: Secondary | ICD-10-CM

## 2020-03-03 DIAGNOSIS — R0789 Other chest pain: Secondary | ICD-10-CM

## 2020-03-03 LAB — TROPONIN I: Troponin I: 6 ng/L (ref <=47)

## 2020-03-03 MED ORDER — BUSPIRONE HCL 15 MG PO TABS: 15.0000 mg | ORAL_TABLET | Freq: Two times a day (BID) | ORAL | 0 refills | Status: DC

## 2020-03-03 NOTE — Telephone Encounter (Signed)
Will you get pt in with cardiolgoist upstairs. Dr. Kirkland Hun ok'd for this  Friday 03-05-20. Please call tomorrow.

## 2020-03-03 NOTE — Patient Instructions (Addendum)
You had recent aypical chest pain one week ago with some palpitations following aytpical pain. I reviewed case and ekg with cardiologist today. He thinks you have some 1st degree av block(I agree with cardiologist). No atial fibrillation on review. Also on auscultation heart sounds regular.  Will refer to cardiologist and ask for appointment Friday. Dr. Kirkland Hun states can get in by then. Likely put on zpatch monitor.  Follow up with [place] after cardiologist.  If any severe chest pain. Or any severe palpitations. Abnormal fast or low pulse then ED evaluation.    Hx of anxiety. Will increase buspar as well.

## 2020-03-03 NOTE — Progress Notes (Addendum)
Subjective:    Patient ID: Patrick Morris, male    DOB: [birth year], 69 y.o.   MRN: [id number]  HPI  Pt had recent low level chest pain one week ago. Then few days later had palpitations. Wife has o2 sat monitor and she briefly saw rate get to 150(checked with 02 sat monitor briefly). But other time was low around 40. But most of the time rate was normal. No jaw pain, no sob, no wheezing and no sweating.  No chest pain or palpitation today.     Review of Systems  Constitutional: Negative for chills, fatigue and fever.  Respiratory: Negative for choking, shortness of breath and stridor.   Cardiovascular: Negative for chest pain and palpitations.       See hpi.  Skin: Negative for rash.  Hematological: Negative for adenopathy. Does not bruise/bleed easily.  Psychiatric/Behavioral: The patient is nervous/anxious.     Past Medical History:  Diagnosis Date  . BPH (benign prostatic hyperplasia)    his specialist is recommending surgery.  . Cataract   . Colitis   . COPD (chronic obstructive pulmonary disease) ([place])   . Ear infection   . GERD (gastroesophageal reflux disease)   . Salmonella   . Sinus infection      Social History   Socioeconomic History  . Marital status: Married    Spouse name: Not on file  . Number of children: Not on file  . Years of education: Not on file  . Highest education level: Not on file  Occupational History  . Not on file  Tobacco Use  . Smoking status: Current Every Day Smoker    Packs/day: 1.00    Years: 55.00    Pack years: 55.00  . Smokeless tobacco: Never Used  . Tobacco comment: down to 0.5ppd  Substance and Sexual Activity  . Alcohol use: Never  . Drug use: No  . Sexual activity: Not Currently    Birth control/protection: None  Other Topics Concern  . Not on file  Social History Narrative  . Not on file   Social Determinants of Health   Financial Resource Strain: Low Risk   . Difficulty of Paying Living Expenses: Not hard  at all  Food Insecurity: No Food Insecurity  . Worried About Charity fundraiser in the Last Year: Never true  . Ran Out of Food in the Last Year: Never true  Transportation Needs: No Transportation Needs  . Lack of Transportation (Medical): No  . Lack of Transportation (Non-Medical): No  Physical Activity: Inactive  . Days of Exercise per Week: 0 days  . Minutes of Exercise per Session: 0 min  Stress: No Stress Concern Present  . Feeling of Stress : Not at all  Social Connections:   . Frequency of Communication with Friends and Family: Not on file  . Frequency of Social Gatherings with Friends and Family: Not on file  . Attends Religious Services: Not on file  . Active Member of Clubs or Organizations: Not on file  . Attends Archivist Meetings: Not on file  . Marital Status: Not on file  Intimate Partner Violence:   . Fear of Current or Ex-Partner: Not on file  . Emotionally Abused: Not on file  . Physically Abused: Not on file  . Sexually Abused: Not on file    Past Surgical History:  Procedure Laterality Date  . IR RADIOLOGIST EVAL & MGMT  05/07/2019  . IR RADIOLOGIST EVAL & MGMT  05/27/2019  .  IR RADIOLOGIST EVAL & MGMT  10/23/2019  . IR RADIOLOGIST EVAL & MGMT  01/15/2020    Family History  Problem Relation Age of Onset  . Hyperlipidemia Father   . Stroke Father   . Alcohol abuse Father   . Throat cancer Brother   . Lymphoma Brother     Allergies  Allergen Reactions  . Aspirin Other (See Comments)    Acid reflux and drooling    Current Outpatient Medications on File Prior to Visit  Medication Sig Dispense Refill  . acetaminophen (TYLENOL) 500 MG tablet Take 1,000 mg by mouth every 6 (six) hours as needed. For pain    . ADVAIR DISKUS 250-50 MCG/DOSE AEPB INHALE 1 DOSE BY MOUTH TWICE DAILY 60 each 0  . ammonium lactate (AMLACTIN) 12 % cream APPLY  CREAM TOPICALLY AS NEEDED FOR DRY SKIN  TWICE DAILY 280 g 0  . azelastine (ASTELIN) 0.1 % nasal spray Place  2 sprays into both nostrils 2 (two) times daily. Use in each nostril as directed 30 mL 12  . busPIRone (BUSPAR) 7.5 MG tablet Take 1 tablet by mouth twice daily 60 tablet 0  . clotrimazole-betamethasone (LOTRISONE) cream Apply 1 application topically 2 (two) times daily. 30 g 0  . doxazosin (CARDURA) 4 MG tablet Take 4 mg by mouth daily.    . famotidine (PEPCID) 20 MG tablet Take 1 tablet (20 mg total) by mouth 2 (two) times daily. 60 tablet 0  . finasteride (PROSCAR) 5 MG tablet Take 5 mg by mouth daily.     . fluticasone (FLONASE) 50 MCG/ACT nasal spray Place 2 sprays into both nostrils daily. 16 g 1  . fluticasone (FLONASE) 50 MCG/ACT nasal spray Use 2 spray(s) in each nostril once daily 16 g 1  . meloxicam (MOBIC) 7.5 MG tablet Take 1 tablet by mouth once daily 14 tablet 0  . mupirocin ointment (BACTROBAN) 2 % Place 1 application into the nose 2 (two) times daily. 22 g 1  . ondansetron (ZOFRAN ODT) 8 MG disintegrating tablet Take 1 tablet (8 mg total) by mouth every 8 (eight) hours as needed for nausea or vomiting. 20 tablet 0  . albuterol (PROVENTIL HFA;VENTOLIN HFA) 108 (90 Base) MCG/ACT inhaler Inhale 2 puffs into the lungs every 6 (six) hours as needed for wheezing or shortness of breath. (Patient not taking: Reported on 09/17/2019) 1 Inhaler 0   No current facility-administered medications on file prior to visit.    BP (!) 145/88   Pulse (!) 55   Resp 20   Ht 5\' 8"  (1.727 m)   Wt 184 lb (83.5 kg)   SpO2 93%   BMI 27.98 kg/m       Objective:   Physical Exam   General- No acute distress. Pleasant patient. Neck- Full range of motion, no jvd Lungs- Clear, even and unlabored. Heart- regular rate and rhythm. Neurologic- CNII- XII grossly intact. Anterior chest- no pain on palpation.      Assessment & Plan:  You had recent aypical chest pain one week ago with some palpitations following aytpical pain. I reviewed case and ekg with cardiologist today. He thinks you have some  1st degree av block(I agree with cardiologist). No atial fibrillation on review. Also on auscultation heart sounds regular.  Will refer to cardiologist and ask for appointment Friday. Dr. Kirkland Hun states can get in by then. Likely put on zpatch monitor.  Follow up with [place] after cardiologist.  If any severe chest pain. Or any severe palpitations.  Abnormal fast or low pulse then ED evaluation.  Hx of anxiety. Will increase buspar as well.  Mackie Pai, PA-C   Time spent with patient today was 40  minutes which consisted of chart review, discussing diagnosis, work up, treatment and documentation. Discusssed case with cardiologist.  [phone]

## 2020-03-03 NOTE — Telephone Encounter (Signed)
[facility]call[facility] Nurse Abigail Butts is calling to cite pts new onset of irregular heartbeat. Wants to set up appt. Appt sch for today at 340pm HR was 88 today but its been skipping around and hard to palpate. Wife says some days its up in the 140's.

## 2020-03-04 LAB — COMPREHENSIVE METABOLIC PANEL
AG Ratio: 1.7 (calc) (ref 1.0–2.5)
ALT: 12 U/L (ref 9–46)
AST: 10 U/L (ref 10–35)
Albumin: 4.3 g/dL (ref 3.6–5.1)
Alkaline phosphatase (APISO): 73 U/L (ref 35–144)
BUN: 15 mg/dL (ref 7–25)
CO2: 27 mmol/L (ref 20–32)
Calcium: 9.2 mg/dL (ref 8.6–10.3)
Chloride: 108 mmol/L (ref 98–110)
Creat: 0.73 mg/dL (ref 0.70–1.18)
Globulin: 2.5 g/dL (calc) (ref 1.9–3.7)
Glucose, Bld: 96 mg/dL (ref 65–99)
Potassium: 4.2 mmol/L (ref 3.5–5.3)
Sodium: 142 mmol/L (ref 135–146)
Total Bilirubin: 0.4 mg/dL (ref 0.2–1.2)
Total Protein: 6.8 g/dL (ref 6.1–8.1)

## 2020-03-04 NOTE — Telephone Encounter (Signed)
Patrick Morris can you get this scheduled for me please?  Thanks

## 2020-03-08 ENCOUNTER — Other Ambulatory Visit: Payer: Self-pay | Admitting: Medical

## 2020-03-08 DIAGNOSIS — H6690 Otitis media, unspecified, unspecified ear: Secondary | ICD-10-CM | POA: Insufficient documentation

## 2020-03-08 DIAGNOSIS — J449 Chronic obstructive pulmonary disease, unspecified: Secondary | ICD-10-CM | POA: Insufficient documentation

## 2020-03-08 DIAGNOSIS — K529 Noninfective gastroenteritis and colitis, unspecified: Secondary | ICD-10-CM | POA: Insufficient documentation

## 2020-03-08 DIAGNOSIS — J329 Chronic sinusitis, unspecified: Secondary | ICD-10-CM | POA: Insufficient documentation

## 2020-03-08 DIAGNOSIS — H269 Unspecified cataract: Secondary | ICD-10-CM | POA: Insufficient documentation

## 2020-03-08 DIAGNOSIS — A029 Salmonella infection, unspecified: Secondary | ICD-10-CM | POA: Insufficient documentation

## 2020-03-09 NOTE — Progress Notes (Addendum)
Cardiology Office Note:    Date:  03/10/2020   ID:  Patrick Morris, DOB July 20, 1950, MRN [id number]  PCP:  Mackie Pai, PA-C  Cardiologist:  Shirlee More, MD   Referring MD: Mackie Pai, PA-C  ASSESSMENT:    1. Chest pain of uncertain etiology   2. Palpitations   3. Coronary artery calcification seen on CAT scan    PLAN:    In order of problems listed above:  1. This is a difficult visit symptoms are vague but he is having exertional chest pain related (coronary artery calcification undergo myocardial perfusion test regarding CAD if abnormal risk would need to consider revascularization. 2. Symptoms are vague apply a 7-day ZIO monitor to screen for arrhythmia if unremarkable I will ask him to purchase the adapter for the iPhone to capture symptomatic episodes in the future. 3. Stable COPD continue his current bronchodilators  Next appointment   Medication Adjustments/Labs and Tests Ordered: Current medicines are reviewed at length with the patient today.  Concerns regarding medicines are outlined above.  Orders Placed This Encounter  Procedures  . LONG TERM MONITOR (3-14 DAYS)  . MYOCARDIAL PERFUSION IMAGING  . EKG 12-Lead   No orders of the defined types were placed in this encounter.    Chief Complaint  Patient presents with  . Palpitations    History of Present Illness:    Patrick Morris is a 69 y.o. male who is being seen today for the evaluation of palpitation and chest pain at the request of [place], Percell Miller, [place].  He underwent attempted CT-guided cryoablation right sided renal lesion consistent with renal cell carcinoma, his wife tells me it was unable to be performed technically.  02/05/2020  Labs [place] 02/05/2000 normal BMP potassium 3.7 GFR greater than 90 cc hemoglobin 14.6  EKG 03/03/2020 independently reviewed by me sinus rhythm 99 bpm first-degree AV block otherwise normal  He has a history of COPD and had a CT of the chest  05/15/2018 showing coronary artery calcification aortic atherosclerosis, bilateral pleural parenchymal scarring gallstones and abnormal thyroid with exophytic nodule.  Is a difficult story to piece together but it begins with a home visit visiting nurse through his insurance notice heart rate is rapid and sent him to his family doctor.  His wife says at times his heart is rapid although she is never  measured it.  The episodes are infrequent unpredictable not severe sustained.  He has no known history of heart disease.  He also gets chest discomfort he calls it a cramp in his left chest sharp and throbbing when he does vigorous work outdoors especially upper extremities and is relieved in 5 to 10 minutes with rest.  He has coronary artery calcification and increased cardiovascular risk and after discussion will undergo myocardial perfusion test.  We will utilize a 7-day ZIO monitor to screen for arrhythmia.  If unremarkable I will asked him to purchase the adapter for the iPhone to record symptomatic episodes in the future. Past Medical History:  Diagnosis Date  . BPH (benign prostatic hyperplasia)    his specialist is recommending surgery.  . Cataract   . Colitis   . COPD (chronic obstructive pulmonary disease) ([place])   . COPD exacerbation ([place]) 01/22/2014  . Ear infection   . GERD (gastroesophageal reflux disease)   . Salmonella   . Sinus infection     Past Surgical History:  Procedure Laterality Date  . IR RADIOLOGIST EVAL & MGMT  05/07/2019  . IR  RADIOLOGIST EVAL & MGMT  05/27/2019  . IR RADIOLOGIST EVAL & MGMT  10/23/2019  . IR RADIOLOGIST EVAL & MGMT  01/15/2020    Current Medications: Current Meds  Medication Sig  . acetaminophen (TYLENOL) 500 MG tablet Take 1,000 mg by mouth every 6 (six) hours as needed. For pain  . ADVAIR DISKUS 250-50 MCG/DOSE AEPB INHALE 1 DOSE BY MOUTH TWICE DAILY  . ammonium lactate (AMLACTIN) 12 % cream APPLY  CREAM TOPICALLY AS NEEDED FOR DRY SKIN  TWICE DAILY    . azelastine (ASTELIN) 0.1 % nasal spray Place 2 sprays into both nostrils 2 (two) times daily. Use in each nostril as directed  . busPIRone (BUSPAR) 15 MG tablet Take 1 tablet (15 mg total) by mouth 2 (two) times daily.  Marland Kitchen doxazosin (CARDURA) 4 MG tablet Take 4 mg by mouth daily.  . finasteride (PROSCAR) 5 MG tablet Take 5 mg by mouth daily.   . fluticasone (FLONASE) 50 MCG/ACT nasal spray Use 2 spray(s) in each nostril once daily  . meloxicam (MOBIC) 7.5 MG tablet Take 1 tablet by mouth once daily     Allergies:   Aspirin   Social History   Socioeconomic History  . Marital status: Married    Spouse name: Not on file  . Number of children: Not on file  . Years of education: Not on file  . Highest education level: Not on file  Occupational History  . Not on file  Tobacco Use  . Smoking status: Current Every Day Smoker    Packs/day: 1.00    Years: 55.00    Pack years: 55.00  . Smokeless tobacco: Never Used  . Tobacco comment: down to 0.5ppd  Substance and Sexual Activity  . Alcohol use: Never  . Drug use: No  . Sexual activity: Not Currently    Birth control/protection: None  Other Topics Concern  . Not on file  Social History Narrative  . Not on file   Social Determinants of Health   Financial Resource Strain: Low Risk   . Difficulty of Paying Living Expenses: Not hard at all  Food Insecurity: No Food Insecurity  . Worried About Charity fundraiser in the Last Year: Never true  . Ran Out of Food in the Last Year: Never true  Transportation Needs: No Transportation Needs  . Lack of Transportation (Medical): No  . Lack of Transportation (Non-Medical): No  Physical Activity: Inactive  . Days of Exercise per Week: 0 days  . Minutes of Exercise per Session: 0 min  Stress: No Stress Concern Present  . Feeling of Stress : Not at all  Social Connections:   . Frequency of Communication with Friends and Family: Not on file  . Frequency of Social Gatherings with Friends  and Family: Not on file  . Attends Religious Services: Not on file  . Active Member of Clubs or Organizations: Not on file  . Attends Archivist Meetings: Not on file  . Marital Status: Not on file     Family History: The patient's family history includes Alcohol abuse in his father; Hyperlipidemia in his father; Lymphoma in his brother; Stroke in his father; Throat cancer in his brother.  ROS:   ROS Please see the history of present illness.  He does have exertional shortness of breath with his COPD doing heavy garden work no cough or wheezing.  All other systems reviewed and are negative.  EKGs/Labs/Other Studies Reviewed:    The following studies were reviewed  today:   EKG:  EKG is  ordered today.  The ekg ordered today is personally reviewed and demonstrates sinus rhythm first-degree AV block 1 PVC otherwise normal  Recent Labs: 03/03/2020: ALT 12; BUN 15; Creat 0.73; Potassium 4.2; Sodium 142  Recent Lipid Panel    Component Value Date/Time   CHOL 112 09/11/2017 0843   TRIG 81.0 09/11/2017 0843   HDL 26.80 (L) 09/11/2017 0843   CHOLHDL 4 09/11/2017 0843   VLDL 16.2 09/11/2017 0843   LDLCALC 69 09/11/2017 0843    Physical Exam:    VS:  BP 130/72   Pulse 95   Ht 5\' 8"  (1.727 m)   Wt 185 lb (83.9 kg)   SpO2 95%   BMI 28.13 kg/m     Wt Readings from Last 3 Encounters:  03/10/20 185 lb (83.9 kg)  03/03/20 184 lb (83.5 kg)  10/09/19 185 lb (83.9 kg)     GEN: He looks older than his age well nourished, well developed in no acute distress HEENT: Normal NECK: No JVD; No carotid bruits LYMPHATICS: No lymphadenopathy CARDIAC: Distant heart sounds RRR, no murmurs, rubs, gallops RESPIRATORY:  Clear to auscultation without rales, wheezing or rhonchi  ABDOMEN: Soft, non-tender, non-distended MUSCULOSKELETAL:  No edema; No deformity  SKIN: Warm and dry NEUROLOGIC:  Alert and oriented x 3 PSYCHIATRIC:  Normal affect     Signed, Shirlee More, MD    03/10/2020 2:19 PM    [facility]

## 2020-03-10 ENCOUNTER — Ambulatory Visit (INDEPENDENT_AMBULATORY_CARE_PROVIDER_SITE_OTHER): Payer: Medicare Other

## 2020-03-10 ENCOUNTER — Ambulatory Visit (INDEPENDENT_AMBULATORY_CARE_PROVIDER_SITE_OTHER): Payer: Medicare Other | Admitting: Cardiology

## 2020-03-10 ENCOUNTER — Other Ambulatory Visit: Payer: Self-pay

## 2020-03-10 ENCOUNTER — Encounter: Payer: Self-pay | Admitting: Cardiology

## 2020-03-10 VITALS — BP 130/72 | HR 95 | Ht 68.0 in | Wt 185.0 lb

## 2020-03-10 DIAGNOSIS — R002 Palpitations: Secondary | ICD-10-CM

## 2020-03-10 DIAGNOSIS — R079 Chest pain, unspecified: Secondary | ICD-10-CM | POA: Diagnosis not present

## 2020-03-10 DIAGNOSIS — I2510 Atherosclerotic heart disease of native coronary artery without angina pectoris: Secondary | ICD-10-CM | POA: Diagnosis not present

## 2020-03-10 NOTE — Patient Instructions (Addendum)
Medication Instructions:  Your physician recommends that you continue on your current medications as directed. Please refer to the Current Medication list given to you today.  *If you need a refill on your cardiac medications before your next appointment, please call your pharmacy*   Lab Work: None If you have labs (blood work) drawn today and your tests are completely normal, you will receive your results only by: Marland Kitchen MyChart Message (if you have MyChart) OR . A paper copy in the mail If you have any lab test that is abnormal or we need to change your treatment, we will call you to review the results.   Testing/Procedures:   [facility] at [facility] [address], [place], [place] [address] Phone: [phone]    Please arrive 15 minutes prior to your appointment time for registration and insurance purposes.  The test will take approximately 3 to 4 hours to complete; you may bring reading material.  If someone comes with you to your appointment, they will need to remain in the main lobby due to limited space in the testing area. **If you are pregnant or breastfeeding, please notify the nuclear lab prior to your appointment**  How to prepare for your Myocardial Perfusion Test: . Do not eat or drink 3 hours prior to your test, except you may have water. . Do not consume products containing caffeine (regular or decaffeinated) 12 hours prior to your test. (ex: coffee, chocolate, sodas, tea). . Do bring a list of your current medications with you.  If not listed below, you may take your medications as normal. . Do wear comfortable clothes (no dresses or overalls) and walking shoes, tennis shoes preferred (No heels or open toe shoes are allowed). . Do NOT wear cologne, perfume, aftershave, or lotions (deodorant is allowed). . If these instructions are not followed, your test will have to be rescheduled.  Please report to [address],  [address] for your test.  If you have questions or concerns about your appointment, you can call the Nuclear Lab at [phone].  If you cannot keep your appointment, please provide 24 hours notification to the Nuclear Lab, to avoid a possible $50 charge to your account.  A zio monitor was ordered today. It will remain on for 7 days. You will then return monitor and event diary in provided box. It takes 1-2 weeks for report to be downloaded and returned to [place]. We will call you with the results. If monitor falls off or has orange flashing light, please call Zio for further instructions.      Follow-Up: At [facility], you and your health needs are our priority.  As part of our continuing mission to provide you with exceptional heart care, we have created designated Provider Care Teams.  These Care Teams include your primary Cardiologist (physician) and Advanced Practice Providers (APPs -  Physician Assistants and Nurse Practitioners) who all work together to provide you with the care you need, when you need it.  We recommend signing up for the patient portal called "MyChart".  Sign up information is provided on this After Visit Summary.  MyChart is used to connect with patients for Virtual Visits (Telemedicine).  Patients are able to view lab/test results, encounter notes, upcoming appointments, etc.  Non-urgent messages can be sent to your provider as well.   To learn more about what you can do with MyChart, go to [email or web address].    Your next appointment:   6  week(s)  The format for your next appointment:   In Person  Provider:   Shirlee More, MD   Other Instructions

## 2020-03-17 ENCOUNTER — Telehealth (HOSPITAL_COMMUNITY): Payer: Self-pay | Admitting: *Deleted

## 2020-03-17 NOTE — Telephone Encounter (Signed)
Left message on voicemail in reference to upcoming appointment scheduled for 03/22/20. Phone number given for a call back so details instructions can be given. Patrick Morris

## 2020-03-22 ENCOUNTER — Other Ambulatory Visit: Payer: Self-pay

## 2020-03-22 ENCOUNTER — Telehealth: Payer: Self-pay

## 2020-03-22 ENCOUNTER — Ambulatory Visit (HOSPITAL_COMMUNITY): Payer: Medicare Other | Attending: Cardiology

## 2020-03-22 DIAGNOSIS — R079 Chest pain, unspecified: Secondary | ICD-10-CM | POA: Diagnosis not present

## 2020-03-22 LAB — MYOCARDIAL PERFUSION IMAGING
LV dias vol: 133 mL (ref 62–150)
LV sys vol: 56 mL
Peak HR: 89 {beats}/min
Rest HR: 63 {beats}/min
SDS: 0
SRS: 0
SSS: 0
TID: 1.08

## 2020-03-22 MED ORDER — TECHNETIUM TC 99M TETROFOSMIN IV KIT
31.9000 | PACK | Freq: Once | INTRAVENOUS | Status: AC | PRN
  Administered 2020-03-22: 31.9 via INTRAVENOUS
  Filled 2020-03-22: qty 32

## 2020-03-22 MED ORDER — TECHNETIUM TC 99M TETROFOSMIN IV KIT
10.1000 | PACK | Freq: Once | INTRAVENOUS | Status: AC | PRN
  Administered 2020-03-22: 10.1 via INTRAVENOUS
  Filled 2020-03-22: qty 11

## 2020-03-22 MED ORDER — REGADENOSON 0.4 MG/5ML IV SOLN
0.4000 mg | Freq: Once | INTRAVENOUS | Status: AC
  Administered 2020-03-22: 0.4 mg via INTRAVENOUS

## 2020-03-22 NOTE — Telephone Encounter (Signed)
-----   Message from Richardo Priest, MD sent at 03/22/2020  2:21 PM EST ----- Is a good result there is no evidence of ischemia.

## 2020-03-22 NOTE — Telephone Encounter (Signed)
Spoke with patient regarding results and recommendation.  Patient verbalizes understanding and is agreeable to plan of care. Advised patient to call back with any issues or concerns.  

## 2020-03-29 ENCOUNTER — Telehealth: Payer: Self-pay | Admitting: Cardiology

## 2020-03-29 ENCOUNTER — Telehealth: Payer: Self-pay

## 2020-03-29 DIAGNOSIS — I484 Atypical atrial flutter: Secondary | ICD-10-CM

## 2020-03-29 MED ORDER — METOPROLOL TARTRATE 25 MG PO TABS: 25.0000 mg | ORAL_TABLET | Freq: Two times a day (BID) | ORAL | 3 refills | Status: DC

## 2020-03-29 NOTE — Telephone Encounter (Signed)
Patrick Morris with [facility] calling to report abnormal results, please call back. Reference number is [id number]   Thank you

## 2020-03-29 NOTE — Telephone Encounter (Signed)
Per Dr. Bettina Gavia:  He is having episodes of atrial flutter or atrial tachycardia with rapid rate I would like to begin him on beta-blocker Lopressor 25 mg twice daily and be seen by EP as soon as possible Dr. Curt Bears.    Spoke with patient regarding results and recommendation.  Patient verbalizes understanding and is agreeable to plan of care. Advised patient to call back with any issues or concerns.

## 2020-03-29 NOTE — Telephone Encounter (Signed)
Report has already been finalized and patient has been notified

## 2020-04-04 ENCOUNTER — Other Ambulatory Visit: Payer: Self-pay | Admitting: Medical

## 2020-04-12 NOTE — Progress Notes (Signed)
Cardiology Office Note:    Date:  04/13/2020   ID:  Patrick Morris, DOB 1950-10-31, MRN [id number]  PCP:  Patrick Pai, PA-C  Cardiologist:  Patrick More, MD    Referring MD: Patrick Pai, PA-C    ASSESSMENT:    1. 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 Chronic anticoagulation   3. Coronary artery calcification seen on CAT scan    PLAN:    In order of problems listed above:  1. After discussion he chooses suppressant therapy for now beta-blocker initiate anticoagulant and will follow him conservatively I will see him back in my office in 6 months and if he is having breakthrough episodes go ahead and refer to electrophysiology. 2. Stable no evidence of ischemia on his perfusion image   Next appointment: 6 months   Medication Adjustments/Labs and Tests Ordered: Current medicines are reviewed at length with the patient today.  Concerns regarding medicines are outlined above.  No orders of the defined types were placed in this encounter.  Meds ordered this encounter  Medications  . apixaban (ELIQUIS) 5 MG TABS tablet    Sig: Take 1 tablet (5 mg total) by mouth 2 (two) times daily.    Dispense:  60 tablet    Refill:  3    Chief Complaint  Patient presents with  . Follow-up  . Atrial Flutter    History of Present Illness:    Patrick Morris is a 69 y.o. male with a hx of chest pain, coronary artery calcification on CT and palpitation last seen 03/10/2020.  Compliance with diet, lifestyle and medications: Yes  Following that visit she had a myocardial perfusion test which showed normal left ventricular function ejection fraction 58% and a small apical fixed defect consistent with attenuation low risk test.  Blood pressure response was normal there were no ischemic ST changes.  He also used an event monitor showing slow atrial flutter or atrial tachycardia with variable conduction and times one-to-one conduction.  She was initiated on beta-blocker and referred for EP  consultation  Fortunately came back to the office his perspective was that his testing has been normal.  He did start a beta-blocker he said no palpitation and tolerates it home heart rate less than 100 bpm.  We discussed the potential for EP referral right now his wife is convalescing from complex esophageal surgery and he does not want to have procedures done we reviewed the benefits and option of anticoagulation and will start him on Eliquis prophylaxis.  I asked him to trend his heart rate at home with a pulse meter write down if he is getting rates greater than one 20-1 30 and I will see him back in the office in 6 months or sooner if he is having symptomatic atrial flutter. Past Medical History:  Diagnosis Date  . BPH (benign prostatic hyperplasia)    his specialist is recommending surgery.  . Cataract   . Colitis   . COPD (chronic obstructive pulmonary disease) ([place])   . COPD exacerbation ([place]) 01/22/2014  . Ear infection   . GERD (gastroesophageal reflux disease)   . Salmonella   . Sinus infection     Past Surgical History:  Procedure Laterality Date  . IR RADIOLOGIST EVAL & MGMT  05/07/2019  . IR RADIOLOGIST EVAL & MGMT  05/27/2019  . IR RADIOLOGIST EVAL & MGMT  10/23/2019  . IR RADIOLOGIST EVAL & MGMT  01/15/2020    Current Medications: Current Meds  Medication Sig  .  acetaminophen (TYLENOL) 500 MG tablet Take 1,000 mg by mouth every 6 (six) hours as needed. For pain  . ADVAIR DISKUS 250-50 MCG/DOSE AEPB Inhale 1 puff into the lungs in the morning and at bedtime.  Marland Kitchen ammonium lactate (AMLACTIN) 12 % cream APPLY  CREAM TOPICALLY AS NEEDED FOR DRY SKIN  TWICE DAILY  . azelastine (ASTELIN) 0.1 % nasal spray Place 2 sprays into both nostrils 2 (two) times daily. Use in each nostril as directed  . busPIRone (BUSPAR) 15 MG tablet Take 1 tablet (15 mg total) by mouth 2 (two) times daily.  Marland Kitchen doxazosin (CARDURA) 4 MG tablet Take 4 mg by mouth daily.  . finasteride (PROSCAR) 5 MG tablet  Take 5 mg by mouth daily.   . fluticasone (FLONASE) 50 MCG/ACT nasal spray Use 2 spray(s) in each nostril once daily  . meloxicam (MOBIC) 7.5 MG tablet Take 1 tablet (7.5 mg total) by mouth daily as needed for pain.  . metoprolol tartrate (LOPRESSOR) 25 MG tablet Take 1 tablet (25 mg total) by mouth 2 (two) times daily.     Allergies:   Aspirin   Social History   Socioeconomic History  . Marital status: Married    Spouse name: Not on file  . Number of children: Not on file  . Years of education: Not on file  . Highest education level: Not on file  Occupational History  . Not on file  Tobacco Use  . Smoking status: Current Every Day Smoker    Packs/day: 1.00    Years: 55.00    Pack years: 55.00  . Smokeless tobacco: Never Used  . Tobacco comment: down to 0.5ppd  Substance and Sexual Activity  . Alcohol use: Never  . Drug use: No  . Sexual activity: Not Currently    Birth control/protection: None  Other Topics Concern  . Not on file  Social History Narrative  . Not on file   Social Determinants of Health   Financial Resource Strain: Low Risk   . Difficulty of Paying Living Expenses: Not hard at all  Food Insecurity: No Food Insecurity  . Worried About Charity fundraiser in the Last Year: Never true  . Ran Out of Food in the Last Year: Never true  Transportation Needs: No Transportation Needs  . Lack of Transportation (Medical): No  . Lack of Transportation (Non-Medical): No  Physical Activity: Inactive  . Days of Exercise per Week: 0 days  . Minutes of Exercise per Session: 0 min  Stress: No Stress Concern Present  . Feeling of Stress : Not at all  Social Connections: Not on file     Family History: The patient's family history includes Alcohol abuse in his father; Hyperlipidemia in his father; Lymphoma in his brother; Stroke in his father; Throat cancer in his brother. ROS:   Please see the history of present illness.    All other systems reviewed and are  negative.  EKGs/Labs/Other Studies Reviewed:    The following studies were reviewed today:  Recent Labs: 03/03/2020: ALT 12; BUN 15; Creat 0.73; Potassium 4.2; Sodium 142  Recent Lipid Panel    Component Value Date/Time   CHOL 112 09/11/2017 0843   TRIG 81.0 09/11/2017 0843   HDL 26.80 (L) 09/11/2017 0843   CHOLHDL 4 09/11/2017 0843   VLDL 16.2 09/11/2017 0843   LDLCALC 69 09/11/2017 0843    Physical Exam:    VS:  BP 110/70   Pulse 98   Ht 5\' 8"  (1.727  m)   Wt 179 lb 1.9 oz (81.2 kg)   SpO2 100%   BMI 27.24 kg/m     Wt Readings from Last 3 Encounters:  04/13/20 179 lb 1.9 oz (81.2 kg)  03/22/20 185 lb (83.9 kg)  03/10/20 185 lb (83.9 kg)     GEN:  Well nourished, well developed in no acute distress HEENT: Normal NECK: No JVD; No carotid bruits LYMPHATICS: No lymphadenopathy CARDIAC: RRR, no murmurs, rubs, gallops RESPIRATORY:  Clear to auscultation without rales, wheezing or rhonchi  ABDOMEN: Soft, non-tender, non-distended MUSCULOSKELETAL:  No edema; No deformity  SKIN: Warm and dry NEUROLOGIC:  Alert and oriented x 3 PSYCHIATRIC:  Normal affect    Signed, Patrick More, MD  04/13/2020 2:22 PM    [facility]

## 2020-04-13 ENCOUNTER — Encounter: Payer: Self-pay | Admitting: Cardiology

## 2020-04-13 ENCOUNTER — Ambulatory Visit (INDEPENDENT_AMBULATORY_CARE_PROVIDER_SITE_OTHER): Payer: Medicare Other | Admitting: Cardiology

## 2020-04-13 ENCOUNTER — Other Ambulatory Visit: Payer: Self-pay

## 2020-04-13 VITALS — BP 110/70 | HR 98 | Ht 68.0 in | Wt 179.1 lb

## 2020-04-13 DIAGNOSIS — I484 Atypical atrial flutter: Secondary | ICD-10-CM | POA: Diagnosis not present

## 2020-04-13 DIAGNOSIS — Z7901 Long term (current) use of anticoagulants: Secondary | ICD-10-CM

## 2020-04-13 DIAGNOSIS — I2510 Atherosclerotic heart disease of native coronary artery without angina pectoris: Secondary | ICD-10-CM

## 2020-04-13 MED ORDER — APIXABAN 5 MG PO TABS: 5.0000 mg | ORAL_TABLET | Freq: Two times a day (BID) | ORAL | 3 refills | Status: DC

## 2020-04-13 NOTE — Patient Instructions (Signed)
Medication Instructions:  Your physician has recommended you make the following change in your medication:  START: Eliquis 5 mg take one tablet by mouth twice daily.  *If you need a refill on your cardiac medications before your next appointment, please call your pharmacy*   Lab Work: None If you have labs (blood work) drawn today and your tests are completely normal, you will receive your results only by: Marland Kitchen MyChart Message (if you have MyChart) OR . A paper copy in the mail If you have any lab test that is abnormal or we need to change your treatment, we will call you to review the results.   Testing/Procedures: None   Follow-Up: At [facility], you and your health needs are our priority.  As part of our continuing mission to provide you with exceptional heart care, we have created designated Provider Care Teams.  These Care Teams include your primary Cardiologist (physician) and Advanced Practice Providers (APPs -  Physician Assistants and Nurse Practitioners) who all work together to provide you with the care you need, when you need it.  We recommend signing up for the patient portal called "MyChart".  Sign up information is provided on this After Visit Summary.  MyChart is used to connect with patients for Virtual Visits (Telemedicine).  Patients are able to view lab/test results, encounter notes, upcoming appointments, etc.  Non-urgent messages can be sent to your provider as well.   To learn more about what you can do with MyChart, go to [email or web address].    Your next appointment:   6 month(s)  The format for your next appointment:   In Person  Provider:   Shirlee More, MD   Other Instructions

## 2020-04-26 ENCOUNTER — Institutional Professional Consult (permissible substitution): Payer: Medicare Other | Admitting: Cardiology

## 2020-04-28 ENCOUNTER — Other Ambulatory Visit: Payer: Self-pay | Admitting: Medical

## 2020-05-24 ENCOUNTER — Other Ambulatory Visit: Payer: Self-pay | Admitting: Medical

## 2020-05-26 ENCOUNTER — Other Ambulatory Visit: Payer: Self-pay | Admitting: Medical

## 2020-05-29 ENCOUNTER — Other Ambulatory Visit: Payer: Self-pay | Admitting: Medical

## 2020-05-31 ENCOUNTER — Other Ambulatory Visit: Payer: Self-pay | Admitting: Medical

## 2020-06-04 ENCOUNTER — Other Ambulatory Visit: Payer: Self-pay | Admitting: Medical

## 2020-06-22 ENCOUNTER — Ambulatory Visit (HOSPITAL_BASED_OUTPATIENT_CLINIC_OR_DEPARTMENT_OTHER)
Admission: RE | Admit: 2020-06-22 | Discharge: 2020-06-22 | Disposition: A | Discharge status: Home / Self Care | Payer: Medicare Other | Source: Ambulatory Visit | Attending: Medical | Admitting: Medical

## 2020-06-22 ENCOUNTER — Telehealth: Payer: Self-pay | Admitting: Medical

## 2020-06-22 ENCOUNTER — Other Ambulatory Visit: Payer: Self-pay

## 2020-06-22 ENCOUNTER — Ambulatory Visit (INDEPENDENT_AMBULATORY_CARE_PROVIDER_SITE_OTHER): Payer: Medicare Other | Admitting: Medical

## 2020-06-22 VITALS — BP 133/82 | HR 93 | Temp 98.1°F | Resp 16 | Ht 68.0 in | Wt 184.0 lb

## 2020-06-22 DIAGNOSIS — E041 Nontoxic single thyroid nodule: Secondary | ICD-10-CM

## 2020-06-22 DIAGNOSIS — N2889 Other specified disorders of kidney and ureter: Secondary | ICD-10-CM

## 2020-06-22 DIAGNOSIS — F17200 Nicotine dependence, unspecified, uncomplicated: Secondary | ICD-10-CM | POA: Diagnosis not present

## 2020-06-22 DIAGNOSIS — R0781 Pleurodynia: Secondary | ICD-10-CM | POA: Insufficient documentation

## 2020-06-22 DIAGNOSIS — Z87448 Personal history of other diseases of urinary system: Secondary | ICD-10-CM

## 2020-06-22 LAB — POC URINALSYSI DIPSTICK (AUTOMATED)
Bilirubin, UA: NEGATIVE
Blood, UA: NEGATIVE
Glucose, UA: NEGATIVE
Ketones, UA: NEGATIVE
Leukocytes, UA: NEGATIVE
Nitrite, UA: NEGATIVE
Protein, UA: NEGATIVE
Spec Grav, UA: 1.02 (ref 1.010–1.025)
Urobilinogen, UA: 1 E.U./dL
pH, UA: 6 (ref 5.0–8.0)

## 2020-06-22 MED ORDER — PREDNISONE 10 MG (21) PO TBPK: ORAL_TABLET | ORAL | 0 refills | Status: DC

## 2020-06-22 NOTE — Addendum Note (Signed)
Addended by: Anabel Halon on: 06/22/2020 03:21 PM   Modules accepted: Orders

## 2020-06-22 NOTE — Addendum Note (Signed)
Addended by: Anabel Halon on: 06/22/2020 07:03 PM   Modules accepted: Orders

## 2020-06-22 NOTE — Progress Notes (Signed)
Subjective:    Patient ID: Patrick Morris, male    DOB: 1950/09/02, 70 y.o.   MRN: [id number]  HPI Pt has some left left lower rib area pain. He points to mid axillary area region.   Pt states pain occurred about 4-5 days ago.   Pt states pain on moving but worse when he coughs.   Pain is sharp when coughs.   Pt is still smoking. Ct screening chest 2019 or lung cancer.  IMPRESSION: 1. 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 Again, scarring is favored. Additional follow-up could be obtained in 6-9 months to ensure continued stability, as clinically indicated. 2. Intermediate attenuation lesion off the posterior right kidney cannot be characterized as a simple cyst, as indicated on comparison exams. MR abdomen without and with contrast is recommended in further evaluation. 3. Gallstones. 4. Exophytic left thyroid nodule. Consider further evaluation with thyroid ultrasound. If patient is clinically hyperthyroid, consider nuclear medicine thyroid uptake and scan. 5. Fluffy sclerosis in the T8 vertebral body is unchanged and indeterminate. 6. Aortic atherosclerosis (ICD10-170.0). Coronary artery Calcification.  Pt has hx of some hematuria. He is on eliquis. Pt thinks was correlated to when he was nsaid. Blood seen every now and then.   Review of Systems  Constitutional: Negative for chills, fatigue and fever.  Respiratory: Positive for cough. Negative for chest tightness, shortness of breath and wheezing.        Coughs a lot during the morning. Some in evening.   Cardiovascular: Negative for chest pain and palpitations.  Gastrointestinal: Negative for abdominal pain.  Genitourinary: Positive for hematuria.  Musculoskeletal: Negative for back pain.       Left rib pain.  Neurological: Negative for dizziness, seizures, speech difficulty and weakness.  Hematological: Negative for adenopathy. Does not  bruise/bleed easily.  Psychiatric/Behavioral: Negative for behavioral problems and confusion.   Past Medical History:  Diagnosis Date  . BPH (benign prostatic hyperplasia)    his specialist is recommending surgery.  . Cataract   . Colitis   . COPD (chronic obstructive pulmonary disease) ([place])   . COPD exacerbation ([place]) 01/22/2014  . Ear infection   . GERD (gastroesophageal reflux disease)   . Salmonella   . Sinus infection      Social History   Socioeconomic History  . Marital status: Married    Spouse name: Not on file  . Number of children: Not on file  . Years of education: Not on file  . Highest education level: Not on file  Occupational History  . Not on file  Tobacco Use  . Smoking status: Current Every Day Smoker    Packs/day: 1.00    Years: 55.00    Pack years: 55.00  . Smokeless tobacco: Never Used  . Tobacco comment: down to 0.5ppd  Substance and Sexual Activity  . Alcohol use: Never  . Drug use: No  . Sexual activity: Not Currently    Birth control/protection: None  Other Topics Concern  . Not on file  Social History Narrative  . Not on file   Social Determinants of Health   Financial Resource Strain: Low Risk   . Difficulty of Paying Living Expenses: Not hard at all  Food Insecurity: No Food Insecurity  . Worried About Charity fundraiser in the Last Year: Never true  . Ran Out of Food in the Last Year: Never true  Transportation Needs: No Transportation Needs  . Lack of Transportation (  Medical): No  . Lack of Transportation (Non-Medical): No  Physical Activity: Inactive  . Days of Exercise per Week: 0 days  . Minutes of Exercise per Session: 0 min  Stress: No Stress Concern Present  . Feeling of Stress : Not at all  Social Connections: Not on file  Intimate Partner Violence: Not on file    Past Surgical History:  Procedure Laterality Date  . IR RADIOLOGIST EVAL & MGMT  05/07/2019  . IR RADIOLOGIST EVAL & MGMT  05/27/2019  . IR RADIOLOGIST  EVAL & MGMT  10/23/2019  . IR RADIOLOGIST EVAL & MGMT  01/15/2020    Family History  Problem Relation Age of Onset  . Hyperlipidemia Father   . Stroke Father   . Alcohol abuse Father   . Throat cancer Brother   . Lymphoma Brother     Allergies  Allergen Reactions  . Aspirin Other (See Comments)    Acid reflux and drooling    Current Outpatient Medications on File Prior to Visit  Medication Sig Dispense Refill  . acetaminophen (TYLENOL) 500 MG tablet Take 1,000 mg by mouth every 6 (six) hours as needed. For pain    . ADVAIR DISKUS 250-50 MCG/DOSE AEPB Inhale 1 puff into the lungs in the morning and at bedtime. 180 each 1  . ammonium lactate (AMLACTIN) 12 % cream APPLY  CREAM TOPICALLY AS NEEDED FOR DRY SKIN  TWICE DAILY 280 g 0  . apixaban (ELIQUIS) 5 MG TABS tablet Take 1 tablet (5 mg total) by mouth 2 (two) times daily. 60 tablet 3  . azelastine (ASTELIN) 0.1 % nasal spray Place 2 sprays into both nostrils 2 (two) times daily. Use in each nostril as directed 30 mL 12  . busPIRone (BUSPAR) 15 MG tablet Take 1 tablet (15 mg total) by mouth 2 (two) times daily. 60 tablet 0  . doxazosin (CARDURA) 4 MG tablet Take 4 mg by mouth daily.    . finasteride (PROSCAR) 5 MG tablet Take 5 mg by mouth daily.     . fluticasone (FLONASE) 50 MCG/ACT nasal spray Use 2 spray(s) in each nostril once daily 16 g 1  . meloxicam (MOBIC) 7.5 MG tablet TAKE 1 TABLET BY MOUTH ONCE DAILY AS NEEDED FOR PAIN 14 tablet 0  . metoprolol tartrate (LOPRESSOR) 25 MG tablet Take 1 tablet (25 mg total) by mouth 2 (two) times daily. 180 tablet 3   No current facility-administered medications on file prior to visit.    BP 133/82   Pulse 93   Temp 98.1 F (36.7 C) (Oral)   Resp 16   Ht 5\' 8"  (1.727 m)   Wt 184 lb (83.5 kg)   SpO2 98%   BMI 27.98 kg/m       Objective:   Physical Exam  General Mental Status- Alert. General Appearance- Not in acute distress.   Skin General: Color- Normal Color. Moisture-  Normal Moisture.  Neck Carotid Arteries- Normal color. Moisture- Normal Moisture. No carotid bruits. No JVD.  Chest and Lung Exam Auscultation: Breath Sounds:-Normal.  Cardiovascular Auscultation:Rythm- Regular. Murmurs & Other Heart Sounds:Auscultation of the heart reveals- No Murmurs.  Abdomen Inspection:-Inspeection Normal. Palpation/Percussion:Note:No mass. Palpation and Percussion of the abdomen reveal- Non Tender, Non Distended + BS, no rebound or guarding.   Neurologic Cranial Nerve exam:- CN III-XII intact(No nystagmus), symmetric smile. Strength:- 5/5 equal and symmetric strength both upper and lower extremities.        Assessment & Plan:  For history of left lower  rib pain will get left rib series with chest x-ray today.  For pain presently recommend that you can use Tylenol.  Avoid any NSAIDs as we discussed.  History of smoking for more than 30 years and at least a pack a day so we will go ahead and get CT of chest screening for lung cancer.  History of thyroid nodule seen on review of last CT chest report.  We will go ahead and get thyroid ultrasound.  History of left side renal mass and history of intermittent rare hematuria.  1 event of bloody urine reported recently and on review did find another UA in 2013+ for blood.  Will refer back to your urologist.  Avoid any NSAIDs while on Eliquis.  Follow-up in 3 to 4 weeks or as needed.  Mackie Pai, PA-C

## 2020-06-22 NOTE — Patient Instructions (Addendum)
For history of left lower rib pain will get left rib series with chest x-ray today.  For pain presently recommend that you can use Tylenol.  Avoid any NSAIDs as we discussed.  History of smoking for more than 30 years and at least a pack a day so we will go ahead and get CT of chest screening for lung cancer.  History of thyroid nodule seen on review of last CT chest report.  We will go ahead and get thyroid ultrasound.  History of left side renal mass and history of intermittent rare hematuria.  1 event of bloody urine reported recently and on review did find another UA in 2013+ for blood.  Will refer back to your urologist.  Avoid any NSAIDs while on Eliquis.  Follow-up in 3 to 4 weeks or as needed.

## 2020-06-22 NOTE — Telephone Encounter (Signed)
Rx taper prednisone sent to pt pharmacy.

## 2020-06-29 ENCOUNTER — Ambulatory Visit (HOSPITAL_BASED_OUTPATIENT_CLINIC_OR_DEPARTMENT_OTHER): Payer: Medicare Other

## 2020-07-01 ENCOUNTER — Other Ambulatory Visit: Payer: Self-pay

## 2020-07-01 ENCOUNTER — Encounter (HOSPITAL_BASED_OUTPATIENT_CLINIC_OR_DEPARTMENT_OTHER): Payer: Self-pay | Admitting: Emergency Medicine

## 2020-07-01 ENCOUNTER — Emergency Department (HOSPITAL_BASED_OUTPATIENT_CLINIC_OR_DEPARTMENT_OTHER)
Admission: EM | Admit: 2020-07-01 | Discharge: 2020-07-01 | Disposition: A | Discharge status: Home / Self Care | Payer: Medicare Other | Attending: Emergency Medicine | Admitting: Emergency Medicine

## 2020-07-01 DIAGNOSIS — E876 Hypokalemia: Secondary | ICD-10-CM | POA: Insufficient documentation

## 2020-07-01 DIAGNOSIS — Z7901 Long term (current) use of anticoagulants: Secondary | ICD-10-CM | POA: Diagnosis not present

## 2020-07-01 DIAGNOSIS — K219 Gastro-esophageal reflux disease without esophagitis: Secondary | ICD-10-CM | POA: Diagnosis not present

## 2020-07-01 DIAGNOSIS — R197 Diarrhea, unspecified: Secondary | ICD-10-CM

## 2020-07-01 DIAGNOSIS — K921 Melena: Secondary | ICD-10-CM | POA: Diagnosis present

## 2020-07-01 DIAGNOSIS — F17210 Nicotine dependence, cigarettes, uncomplicated: Secondary | ICD-10-CM | POA: Insufficient documentation

## 2020-07-01 DIAGNOSIS — J441 Chronic obstructive pulmonary disease with (acute) exacerbation: Secondary | ICD-10-CM | POA: Diagnosis not present

## 2020-07-01 DIAGNOSIS — A09 Infectious gastroenteritis and colitis, unspecified: Secondary | ICD-10-CM | POA: Diagnosis not present

## 2020-07-01 LAB — CBC WITH DIFFERENTIAL/PLATELET
Abs Immature Granulocytes: 0.04 10*3/uL (ref 0.00–0.07)
Basophils Absolute: 0 10*3/uL (ref 0.0–0.1)
Basophils Relative: 0 %
Eosinophils Absolute: 0.2 10*3/uL (ref 0.0–0.5)
Eosinophils Relative: 3 %
HCT: 35.9 % — ABNORMAL LOW (ref 39.0–52.0)
Hemoglobin: 12.6 g/dL — ABNORMAL LOW (ref 13.0–17.0)
Immature Granulocytes: 1 %
Lymphocytes Relative: 10 %
Lymphs Abs: 0.8 10*3/uL (ref 0.7–4.0)
MCH: 31.3 pg (ref 26.0–34.0)
MCHC: 35.1 g/dL (ref 30.0–36.0)
MCV: 89.1 fL (ref 80.0–100.0)
Monocytes Absolute: 0.6 10*3/uL (ref 0.1–1.0)
Monocytes Relative: 7 %
Neutro Abs: 6.2 10*3/uL (ref 1.7–7.7)
Neutrophils Relative %: 79 %
Platelets: 251 10*3/uL (ref 150–400)
RBC: 4.03 MIL/uL — ABNORMAL LOW (ref 4.22–5.81)
RDW: 12.4 % (ref 11.5–15.5)
WBC: 7.9 10*3/uL (ref 4.0–10.5)
nRBC: 0 % (ref 0.0–0.2)

## 2020-07-01 LAB — COMPREHENSIVE METABOLIC PANEL
ALT: 20 U/L (ref 0–44)
AST: 13 U/L — ABNORMAL LOW (ref 15–41)
Albumin: 3.5 g/dL (ref 3.5–5.0)
Alkaline Phosphatase: 63 U/L (ref 38–126)
Anion gap: 9 (ref 5–15)
BUN: 18 mg/dL (ref 8–23)
CO2: 23 mmol/L (ref 22–32)
Calcium: 8.2 mg/dL — ABNORMAL LOW (ref 8.9–10.3)
Chloride: 105 mmol/L (ref 98–111)
Creatinine, Ser: 0.68 mg/dL (ref 0.61–1.24)
GFR, Estimated: >60 mL/min (ref >60)
Glucose, Bld: 127 mg/dL — ABNORMAL HIGH (ref 70–99)
Potassium: 3.1 mmol/L — ABNORMAL LOW (ref 3.5–5.1)
Sodium: 137 mmol/L (ref 135–145)
Total Bilirubin: 0.6 mg/dL (ref 0.3–1.2)
Total Protein: 6.5 g/dL (ref 6.5–8.1)

## 2020-07-01 MED ORDER — SODIUM CHLORIDE 0.9 % IV BOLUS
1000.0000 mL | Freq: Once | INTRAVENOUS | Status: AC
  Administered 2020-07-01: 1000 mL via INTRAVENOUS

## 2020-07-01 MED ORDER — POTASSIUM CHLORIDE CRYS ER 20 MEQ PO TBCR
40.0000 meq | EXTENDED_RELEASE_TABLET | Freq: Once | ORAL | Status: AC
  Administered 2020-07-01: 40 meq via ORAL
  Filled 2020-07-01: qty 2

## 2020-07-01 NOTE — ED Provider Notes (Addendum)
[facility] EMERGENCY DEPARTMENT Provider Note   CSN: [id number] Arrival date & time: 07/01/20  0932     History Chief Complaint  Patient presents with  . Diarrhea    Patrick Morris is a 70 y.o. male.  HPI 70 year old male presents with rectal bleeding.  Over the last couple days he has had significant amount of diarrhea.  Yesterday the last 2 episodes had some blood in them.  Since then no further diarrhea or blood.  No rectal pain.  Last night his abdomen was feeling bloated and painful but right now it is feeling a lot better.  No fevers.  He is on Eliquis.  Was feeling pretty weak initially but now is feeling better and was able to eat this morning.   Past Medical History:  Diagnosis Date  . BPH (benign prostatic hyperplasia)    his specialist is recommending surgery.  . Cataract   . Colitis   . COPD (chronic obstructive pulmonary disease) ([place])   . COPD exacerbation ([place]) 01/22/2014  . Ear infection   . GERD (gastroesophageal reflux disease)   . Salmonella   . Sinus infection     Patient Active Problem List   Diagnosis Date Noted  . Sinus infection   . Salmonella   . Ear infection   . COPD (chronic obstructive pulmonary disease) ([place])   . Colitis   . Cataract   . Pain in left foot 09/20/2016  . Pain in left leg 09/20/2016  . Wellness examination 02/24/2014  . GERD (gastroesophageal reflux disease) 01/22/2014  . COPD exacerbation ([place]) 01/22/2014  . Skin lesion 01/22/2014  . BPH (benign prostatic hyperplasia) 01/22/2014  . Cough 01/22/2014    Past Surgical History:  Procedure Laterality Date  . IR RADIOLOGIST EVAL & MGMT  05/07/2019  . IR RADIOLOGIST EVAL & MGMT  05/27/2019  . IR RADIOLOGIST EVAL & MGMT  10/23/2019  . IR RADIOLOGIST EVAL & MGMT  01/15/2020       Family History  Problem Relation Age of Onset  . Hyperlipidemia Father   . Stroke Father   . Alcohol abuse Father   . Throat cancer Brother   . Lymphoma Brother     Social History    Tobacco Use  . Smoking status: Current Every Day Smoker    Packs/day: 1.00    Years: 55.00    Pack years: 55.00  . Smokeless tobacco: Never Used  . Tobacco comment: down to 0.5ppd  Substance Use Topics  . Alcohol use: Never  . Drug use: No    Home Medications Prior to Admission medications   Medication Sig Start Date End Date Taking? Authorizing Provider  acetaminophen (TYLENOL) 500 MG tablet Take 1,000 mg by mouth every 6 (six) hours as needed. For pain    [provider]  ADVAIR DISKUS 250-50 MCG/DOSE AEPB Inhale 1 puff into the lungs in the morning and at bedtime. 04/06/20   Saguier, Percell Miller, PA-C  ammonium lactate (AMLACTIN) 12 % cream APPLY  CREAM TOPICALLY AS NEEDED FOR DRY SKIN  TWICE DAILY 11/11/18   Saguier, Percell Miller, PA-C  apixaban (ELIQUIS) 5 MG TABS tablet Take 1 tablet (5 mg total) by mouth 2 (two) times daily. 04/13/20   Richardo Priest, MD  azelastine (ASTELIN) 0.1 % nasal spray Place 2 sprays into both nostrils 2 (two) times daily. Use in each nostril as directed 08/07/17   Saguier, Percell Miller, PA-C  busPIRone (BUSPAR) 15 MG tablet Take 1 tablet (15 mg total) by mouth  2 (two) times daily. 03/03/20   Saguier, Percell Miller, PA-C  doxazosin (CARDURA) 4 MG tablet Take 4 mg by mouth daily.    [provider]  finasteride (PROSCAR) 5 MG tablet Take 5 mg by mouth daily.     [provider]  fluticasone Asencion Islam) 50 MCG/ACT nasal spray Use 2 spray(s) in each nostril once daily 10/10/19   Saguier, Percell Miller, PA-C  metoprolol tartrate (LOPRESSOR) 25 MG tablet Take 1 tablet (25 mg total) by mouth 2 (two) times daily. 03/29/20 06/27/20  Richardo Priest, MD  predniSONE (STERAPRED UNI-PAK 21 TAB) 10 MG (21) TBPK tablet Taper over 6 days. 06/22/20   Saguier, Percell Miller, PA-C    Allergies    Aspirin  Review of Systems   Review of Systems  Constitutional: Negative for fever.  Gastrointestinal: Positive for abdominal pain, blood in stool and diarrhea. Negative for rectal pain.   All other systems reviewed and are negative.   Physical Exam Updated Vital Signs BP 108/69 (BP Location: Right Arm)   Pulse 61   Temp 98.1 F (36.7 C) (Oral)   Resp 16   Ht 5\' 8"  (1.727 m)   Wt 72.6 kg   SpO2 96%   BMI 24.33 kg/m   Physical Exam Vitals and nursing note reviewed.  Constitutional:      Appearance: He is well-developed and well-nourished.  HENT:     Head: Normocephalic and atraumatic.     Right Ear: External ear normal.     Left Ear: External ear normal.     Nose: Nose normal.  Eyes:     General:        Right eye: No discharge.        Left eye: No discharge.  Cardiovascular:     Rate and Rhythm: Normal rate and regular rhythm.     Heart sounds: Normal heart sounds.  Pulmonary:     Effort: Pulmonary effort is normal.     Breath sounds: Normal breath sounds.  Abdominal:     Palpations: Abdomen is soft.     Tenderness: There is no abdominal tenderness.  Genitourinary:    Rectum: No tenderness or external hemorrhoid. Normal anal tone.     Comments: No gross blood or obvious hemorrhoids on DRE Musculoskeletal:        General: No edema.     Cervical back: Neck supple.  Skin:    General: Skin is warm and dry.  Neurological:     Mental Status: He is alert.  Psychiatric:        Mood and Affect: Mood is not anxious.     ED Results / Procedures / Treatments   Labs (all labs ordered are listed, but only abnormal results are displayed) Labs Reviewed  COMPREHENSIVE METABOLIC PANEL - Abnormal; Notable for the following components:      Result Value   Potassium 3.1 (*)    Glucose, Bld 127 (*)    Calcium 8.2 (*)    AST 13 (*)    All other components within normal limits  CBC WITH DIFFERENTIAL/PLATELET - Abnormal; Notable for the following components:   RBC 4.03 (*)    Hemoglobin 12.6 (*)    HCT 35.9 (*)    All other components within normal limits    EKG None  Radiology No results found.  Procedures Procedures  Medications Ordered in  ED Medications  sodium chloride 0.9 % bolus 1,000 mL (1,000 mLs Intravenous New Bag/Given 07/01/20 1026)  potassium chloride SA (KLOR-CON) CR tablet  40 mEq (40 mEq Oral Given 07/01/20 1107)    ED Course  I have reviewed the triage vital signs and the nursing notes.  Pertinent labs & imaging results that were available during my care of the patient were reviewed by me and considered in my medical decision making (see chart for details).    MDM Rules/Calculators/A&P                          Patient's exam is unremarkable. Vital signs are normal. No gross blood on rectal exam. He has some hypokalemia that goes along with his diarrhea but otherwise labs are pretty unremarkable besides minimal anemia. Suspect he had infectious diarrhea, but given the improvement and no abdominal pain now I do not think emergent CT is needed. Doubt significant colitis, ischemia, etc. Will discharge home with return precautions. Final Clinical Impression(s) / ED Diagnoses Final diagnoses:  Diarrhea of presumed infectious origin  Hypokalemia    Rx / DC Orders ED Discharge Orders    None       Sherwood Gambler, MD 07/01/20 1113    Sherwood Gambler, MD 07/01/20 1113

## 2020-07-01 NOTE — Discharge Instructions (Signed)
If you develop worsening, continued, or recurrent abdominal pain, uncontrolled vomiting, fever, chest or back pain, or any other new/concerning symptoms then return to the ER for evaluation.  

## 2020-07-01 NOTE — ED Notes (Signed)
Patient verbalized understanding of dc instructions, prescriptions, follow up referrals and reasons to return to ER for reevaluation.  

## 2020-07-01 NOTE — ED Triage Notes (Signed)
States has been feeling sick a couple of days had diarrhea and yesterday had blood in his stool bright red with the stool,  , today has been gassy but no diarrhea, no nausea or vomiting

## 2020-07-14 ENCOUNTER — Ambulatory Visit (HOSPITAL_BASED_OUTPATIENT_CLINIC_OR_DEPARTMENT_OTHER)
Admission: RE | Admit: 2020-07-14 | Discharge: 2020-07-14 | Disposition: A | Discharge status: Home / Self Care | Payer: Medicare Other | Source: Ambulatory Visit | Attending: Medical | Admitting: Medical

## 2020-07-14 ENCOUNTER — Other Ambulatory Visit: Payer: Self-pay

## 2020-07-14 DIAGNOSIS — I700 Atherosclerosis of aorta: Secondary | ICD-10-CM | POA: Insufficient documentation

## 2020-07-14 DIAGNOSIS — E041 Nontoxic single thyroid nodule: Secondary | ICD-10-CM | POA: Insufficient documentation

## 2020-07-14 DIAGNOSIS — F17210 Nicotine dependence, cigarettes, uncomplicated: Secondary | ICD-10-CM | POA: Insufficient documentation

## 2020-07-14 DIAGNOSIS — J439 Emphysema, unspecified: Secondary | ICD-10-CM | POA: Diagnosis not present

## 2020-07-14 DIAGNOSIS — Z122 Encounter for screening for malignant neoplasm of respiratory organs: Secondary | ICD-10-CM | POA: Insufficient documentation

## 2020-07-14 DIAGNOSIS — F17200 Nicotine dependence, unspecified, uncomplicated: Secondary | ICD-10-CM

## 2020-07-14 DIAGNOSIS — K8020 Calculus of gallbladder without cholecystitis without obstruction: Secondary | ICD-10-CM | POA: Diagnosis not present

## 2020-07-15 ENCOUNTER — Telehealth: Payer: Self-pay | Admitting: Medical

## 2020-07-15 ENCOUNTER — Telehealth: Payer: Self-pay

## 2020-07-15 DIAGNOSIS — R911 Solitary pulmonary nodule: Secondary | ICD-10-CM

## 2020-07-15 DIAGNOSIS — F17200 Nicotine dependence, unspecified, uncomplicated: Secondary | ICD-10-CM

## 2020-07-15 DIAGNOSIS — N289 Disorder of kidney and ureter, unspecified: Secondary | ICD-10-CM

## 2020-07-15 DIAGNOSIS — E041 Nontoxic single thyroid nodule: Secondary | ICD-10-CM

## 2020-07-15 NOTE — Telephone Encounter (Signed)
Referral to pulmonologist place. Lung CA in differential. So high priority on referral.

## 2020-07-15 NOTE — Telephone Encounter (Signed)
CRITICAL VALUE STICKER  CRITICAL VALUE: CT Chest [place] Cancer Screening Low Dose WO Contrast: Lung Rad 4B  RECEIVER (on-site recipient of call): Alvy Bimler, [place] NOTIFIED: 07/15/2020 at 12:35 PM  MESSENGER (representative from lab): Olivia Mackie from [facility].   MD NOTIFIED: Mackie Pai, PA-C  TIME OF NOTIFICATION: 12:36 PM  RESPONSE:  Value noted and will look further at imaging report.

## 2020-07-15 NOTE — Telephone Encounter (Signed)
Noted. Will review the report and respond.

## 2020-07-15 NOTE — Telephone Encounter (Signed)
Referral to ent for thyroid nodule.

## 2020-07-15 NOTE — Telephone Encounter (Signed)
Referral to urologist placed.

## 2020-07-16 ENCOUNTER — Other Ambulatory Visit: Payer: Self-pay | Admitting: Medical

## 2020-07-16 NOTE — Telephone Encounter (Signed)
Faythe Ghee.. his appt is  03/31 I'll give me a call

## 2020-07-16 NOTE — Telephone Encounter (Signed)
McQuaid is no longer with [facility]

## 2020-07-16 NOTE — Telephone Encounter (Signed)
FYI I did Try [facility], [facility] next appt was April, [facility] was August. I also tried [facility] Dr. Verdie Mosher but was uable to get an answer on the phone after multiple attempts.

## 2020-07-16 NOTE — Telephone Encounter (Signed)
Called [facility]. The earliest they could get patient in with Dr. Leory Plowman  Icard  is 07/30/19 at 11:30am

## 2020-07-16 NOTE — Telephone Encounter (Signed)
If you would keep him with [facility]. I stated McQuaid since he saw pt in past. One of partners will be fine.

## 2020-07-29 ENCOUNTER — Other Ambulatory Visit: Payer: Self-pay

## 2020-07-29 ENCOUNTER — Ambulatory Visit (INDEPENDENT_AMBULATORY_CARE_PROVIDER_SITE_OTHER): Payer: Medicare Other | Admitting: Pulmonary Disease

## 2020-07-29 ENCOUNTER — Encounter: Payer: Self-pay | Admitting: Pulmonary Disease

## 2020-07-29 ENCOUNTER — Telehealth: Payer: Self-pay | Admitting: Pulmonary Disease

## 2020-07-29 VITALS — BP 122/76 | HR 67 | Temp 98.7°F | Ht 66.0 in | Wt 181.0 lb

## 2020-07-29 DIAGNOSIS — R918 Other nonspecific abnormal finding of lung field: Secondary | ICD-10-CM

## 2020-07-29 DIAGNOSIS — R911 Solitary pulmonary nodule: Secondary | ICD-10-CM | POA: Insufficient documentation

## 2020-07-29 DIAGNOSIS — J432 Centrilobular emphysema: Secondary | ICD-10-CM

## 2020-07-29 DIAGNOSIS — F17200 Nicotine dependence, unspecified, uncomplicated: Secondary | ICD-10-CM

## 2020-07-29 DIAGNOSIS — Z716 Tobacco abuse counseling: Secondary | ICD-10-CM

## 2020-07-29 DIAGNOSIS — F17210 Nicotine dependence, cigarettes, uncomplicated: Secondary | ICD-10-CM | POA: Diagnosis not present

## 2020-07-29 HISTORY — DX: Solitary pulmonary nodule: R91.1

## 2020-07-29 NOTE — Progress Notes (Signed)
Smoking Cessation Counseling:   The patient's current tobacco use: 0.5 ppd, max 3 ppd The patient was advised to quit and impact of smoking on their health.  I assessed the patient's willingness to attempt to quit. I provided methods and skills for cessation. We reviewed medication management of smoking session drugs if appropriate. Resources to help quit smoking were provided. A smoking cessation quit date was set: July 4th Follow-up was arranged in our clinic.  The amount of time spent counseling patient was 4 mins    Garner Nash, DO [facility] 07/29/2020 11:57 AM

## 2020-07-29 NOTE — H&P (View-Only) (Signed)
Synopsis: Referred in March 2022 for pulmonary nodule by Mackie Pai, PA-C  Subjective:   PATIENT ID: [place]: male DOB: [birth year], MRN: [id number]  Chief Complaint  Patient presents with  . Follow-up    Mostly nonproductive cough for years, sometimes productive with white sputum.     This is a 70 year old gentleman history of COPD, GERD, BPH. Current smoker 0.5 ppd. Smoked for 55+ years. Seen today for evaluation after lung cancer screening CT. patient is unfortunately still smoking.  We discussed this today.  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  [place]  Patient did enroll this past month for his first lung cancer screening CT.  He does know about his renal lesion that they attempted to freeze before.  They were unable to successfully get into that space.  He has follow-up planned for this.  From a respiratory standpoint he does have shortness of breath with exertion and cough.  He does use Advair daily.    Past Medical History:  Diagnosis Date  . BPH (benign prostatic hyperplasia)    his specialist is recommending surgery.  . Cataract   . Colitis   . COPD (chronic obstructive pulmonary disease) ([place])   . COPD exacerbation ([place]) 01/22/2014  . Ear infection   . GERD (gastroesophageal reflux disease)   . Salmonella   . Sinus infection      Family History  Problem Relation Age of Onset  . Hyperlipidemia Father   . Stroke Father   . Alcohol abuse Father   . Throat cancer Brother   . Lymphoma Brother      Past Surgical History:  Procedure Laterality Date  . IR RADIOLOGIST EVAL & MGMT  05/07/2019  . IR RADIOLOGIST EVAL & MGMT  05/27/2019  . IR RADIOLOGIST EVAL & MGMT  10/23/2019  . IR RADIOLOGIST EVAL & MGMT  01/15/2020    Social History   Socioeconomic History  . Marital status: Married    Spouse name: Not on file  . Number of children: Not on file  . Years of education: Not on file  . Highest  education level: Not on file  Occupational History  . Not on file  Tobacco Use  . Smoking status: Current Every Day Smoker    Packs/day: 1.00    Years: 55.00    Pack years: 55.00  . Smokeless tobacco: Never Used  . Tobacco comment: down to 0.5ppd  Vaping Use  . Vaping Use: Never used  Substance and Sexual Activity  . Alcohol use: Never  . Drug use: No  . Sexual activity: Not Currently    Birth control/protection: None  Other Topics Concern  . Not on file  Social History Narrative  . Not on file   Social Determinants of Health   Financial Resource Strain: Low Risk   . Difficulty of Paying Living Expenses: Not hard at all  Food Insecurity: No Food Insecurity  . Worried About Charity fundraiser in the Last Year: Never true  . Ran Out of Food in the Last Year: Never true  Transportation Needs: No Transportation Needs  . Lack of Transportation (Medical): No  . Lack of Transportation (Non-Medical): No  Physical Activity: Inactive  . Days of Exercise per Week: 0 days  . Minutes of Exercise per Session: 0 min  Stress: No Stress Concern Present  . Feeling of Stress : Not at all  Social Connections: Not on file  Intimate Partner Violence: Not on file     Allergies  Allergen Reactions  . Aspirin Other (See Comments)    Acid reflux and drooling     Outpatient Medications Prior to Visit  Medication Sig Dispense Refill  . acetaminophen (TYLENOL) 500 MG tablet Take 1,000 mg by mouth every 6 (six) hours as needed. For pain    . ADVAIR DISKUS 250-50 MCG/DOSE AEPB Inhale 1 puff into the lungs in the morning and at bedtime. 180 each 1  . ammonium lactate (AMLACTIN) 12 % cream APPLY  CREAM TOPICALLY AS NEEDED FOR DRY SKIN  TWICE DAILY 280 g 0  . azelastine (ASTELIN) 0.1 % nasal spray Place 2 sprays into both nostrils 2 (two) times daily. Use in each nostril as directed 30 mL 12  . doxazosin (CARDURA) 4 MG tablet Take 4 mg by mouth daily.    . finasteride (PROSCAR) 5 MG tablet Take  5 mg by mouth daily.     . fluticasone (FLONASE) 50 MCG/ACT nasal spray Use 2 spray(s) in each nostril once daily 16 g 1  . albuterol (VENTOLIN HFA) 108 (90 Base) MCG/ACT inhaler Inhale into the lungs. (Patient not taking: Reported on 07/29/2020)    . apixaban (ELIQUIS) 5 MG TABS tablet Take 1 tablet (5 mg total) by mouth 2 (two) times daily. (Patient not taking: Reported on 07/29/2020) 60 tablet 3  . busPIRone (BUSPAR) 15 MG tablet Take 1 tablet (15 mg total) by mouth 2 (two) times daily. (Patient not taking: Reported on 07/29/2020) 60 tablet 0  . metoprolol tartrate (LOPRESSOR) 25 MG tablet Take 1 tablet (25 mg total) by mouth 2 (two) times daily. 180 tablet 3  . predniSONE (STERAPRED UNI-PAK 21 TAB) 10 MG (21) TBPK tablet Taper over 6 days. 21 tablet 0   No facility-administered medications prior to visit.    Review of Systems  Constitutional: Negative for chills, fever, malaise/fatigue and weight loss.  HENT: Negative for hearing loss, sore throat and tinnitus.   Eyes: Negative for blurred vision and double vision.  Respiratory: Positive for cough and shortness of breath. Negative for hemoptysis, sputum production, wheezing and stridor.   Cardiovascular: Negative for chest pain, palpitations, orthopnea, leg swelling and PND.  Gastrointestinal: Negative for abdominal pain, constipation, diarrhea, heartburn, nausea and vomiting.  Genitourinary: Negative for dysuria, hematuria and urgency.  Musculoskeletal: Negative for joint pain and myalgias.  Skin: Negative for itching and rash.  Neurological: Negative for dizziness, tingling, weakness and headaches.  Endo/Heme/Allergies: Negative for environmental allergies. Does not bruise/bleed easily.  Psychiatric/Behavioral: Negative for depression. The patient is not nervous/anxious and does not have insomnia.   All other systems reviewed and are negative.    Objective:  Physical Exam Vitals reviewed.  Constitutional:      General: He is not in  acute distress.    Appearance: He is well-developed.  HENT:     Head: Normocephalic and atraumatic.  Eyes:     General: No scleral icterus.    Conjunctiva/sclera: Conjunctivae normal.     Pupils: Pupils are equal, round, and reactive to light.  Neck:     Vascular: No JVD.     Trachea: No tracheal deviation.  Cardiovascular:     Rate and Rhythm: Normal rate and regular rhythm.     Heart sounds: Normal heart sounds. No murmur heard.   Pulmonary:     Effort: Pulmonary effort is normal. No tachypnea, accessory muscle usage or respiratory distress.  Breath sounds: No stridor. No wheezing, rhonchi or rales.  Abdominal:     General: Bowel sounds are normal. There is no distension.     Palpations: Abdomen is soft.     Tenderness: There is no abdominal tenderness.  Musculoskeletal:        General: No tenderness.     Cervical back: Neck supple.  Lymphadenopathy:     Cervical: No cervical adenopathy.  Skin:    General: Skin is warm and dry.     Capillary Refill: Capillary refill takes less than 2 seconds.     Findings: No rash.  Neurological:     Mental Status: He is alert and oriented to person, place, and time.  Psychiatric:        Behavior: Behavior normal.      Vitals:   07/29/20 1131  BP: 122/76  Pulse: 67  Temp: 98.7 F (37.1 C)  TempSrc: Temporal  SpO2: 97%  Weight: 181 lb (82.1 kg)  Height: 5\' 6"  (1.676 m)   97% on RA BMI Readings from Last 3 Encounters:  07/29/20 29.21 kg/m  07/01/20 24.33 kg/m  06/22/20 27.98 kg/m   Wt Readings from Last 3 Encounters:  07/29/20 181 lb (82.1 kg)  07/01/20 160 lb (72.6 kg)  06/22/20 184 lb (83.5 kg)     CBC    Component Value Date/Time   WBC 7.9 07/01/2020 1026   RBC 4.03 (L) 07/01/2020 1026   HGB 12.6 (L) 07/01/2020 1026   HCT 35.9 (L) 07/01/2020 1026   PLT 251 07/01/2020 1026   MCV 89.1 07/01/2020 1026   MCH 31.3 07/01/2020 1026   MCHC 35.1 07/01/2020 1026   RDW 12.4 07/01/2020 1026   LYMPHSABS 0.8  07/01/2020 1026   MONOABS 0.6 07/01/2020 1026   EOSABS 0.2 07/01/2020 1026   BASOSABS 0.0 07/01/2020 1026     Chest Imaging: Lung cancer screening CT 07/14/2020: New 11.5 mm right middle lobe lung nodule concerning for malignancy.  There is also areas of tree-in-bud and small or tiny pulmonary nodules.  He also has a exophytic kidney lesion. The patient's images have been independently reviewed by me.    Pulmonary Functions Testing Results: No flowsheet data found.  FeNO:   Pathology:   Echocardiogram:   Gated Myoview perfusion Lexi scan:  Nuclear stress EF: 58%.  There was no ST segment deviation noted during stress.  There is a small defect of mild severity present in the apex location. The defect is non-reversible. In the setting of normal LVF, this is consistent with diaphragmatic attenuation artifact. No ischemia.  This is a low risk study.  The left ventricular ejection fraction is normal (55-65%).    Heart Catheterization:     Assessment & Plan:     ICD-10-CM   1. Nodule of middle lobe of right lung  R91.1 Ambulatory referral to Pulmonology    Procedural/ Surgical Case Request: VIDEO BRONCHOSCOPY WITH ENDOBRONCHIAL NAVIGATION    CT Super D Chest Wo Contrast    NM PET Image Initial (PI) Skull Base To Thigh  2. Centrilobular emphysema ([place])  J43.2   3. Current smoker  F17.200   4. Encounter for smoking cessation counseling  Z71.6   5. Abnormal CT lung screening  R91.8     Discussion:  This is a 70 year old gentleman, longstanding history of tobacco abuse greater than 55+ years, max at 3 packs/day, currently down to half a pack per day.  He does have associated centrilobular emphysema.  Previous diagnosis of COPD.  Currently managed with Advair discus.  He had an abnormal lung cancer screening CT with a dominant 11.5 mm right middle lobe lung nodule.  He also has some areas of scattered tree-in-bud with the possibility of underlying MAI.  Plan: We discussed  various options to include PET scan imaging and consideration for referral to thoracic surgery. Patient is very adamant that he would never want a big procedure. He was also adamant about not having any evaluation of this lung nodule after initial discussions. We talked about the different treatment options if this in fact was a malignancy. After discussion with patient's wife and him they decided that they would like to at least know whether or not we are dealing with a malignancy and consider biopsy.  Therefore the discussion changed to the risk benefits and alternatives of navigational bronchoscopy versus percutaneous biopsy. Patient is agreeable to proceed with navigational bronchoscopy for tissue sampling.  I did explain this was a small lesion and there was a chance for false negative results.  This based on diagnostic yield for approximate centimeter and subcentimeter lesions.  We reviewed the patient's images today in the office. He will also need a super D CT scan. We will also go ahead and obtain a nuclear medicine pet image for further evaluation of the nodule.  Patient has tentatively been scheduled for bronchoscopy with endobronchial navigation, tissue biopsy and fiducial placement on 08/12/2020.    Current Outpatient Medications:  .  acetaminophen (TYLENOL) 500 MG tablet, Take 1,000 mg by mouth every 6 (six) hours as needed. For pain, Disp: , Rfl:  .  ADVAIR DISKUS 250-50 MCG/DOSE AEPB, Inhale 1 puff into the lungs in the morning and at bedtime., Disp: 180 each, Rfl: 1 .  ammonium lactate (AMLACTIN) 12 % cream, APPLY  CREAM TOPICALLY AS NEEDED FOR DRY SKIN  TWICE DAILY, Disp: 280 g, Rfl: 0 .  azelastine (ASTELIN) 0.1 % nasal spray, Place 2 sprays into both nostrils 2 (two) times daily. Use in each nostril as directed, Disp: 30 mL, Rfl: 12 .  doxazosin (CARDURA) 4 MG tablet, Take 4 mg by mouth daily., Disp: , Rfl:  .  finasteride (PROSCAR) 5 MG tablet, Take 5 mg by mouth daily. ,  Disp: , Rfl:  .  fluticasone (FLONASE) 50 MCG/ACT nasal spray, Use 2 spray(s) in each nostril once daily, Disp: 16 g, Rfl: 1 .  albuterol (VENTOLIN HFA) 108 (90 Base) MCG/ACT inhaler, Inhale into the lungs. (Patient not taking: Reported on 07/29/2020), Disp: , Rfl:  .  apixaban (ELIQUIS) 5 MG TABS tablet, Take 1 tablet (5 mg total) by mouth 2 (two) times daily. (Patient not taking: Reported on 07/29/2020), Disp: 60 tablet, Rfl: 3 .  busPIRone (BUSPAR) 15 MG tablet, Take 1 tablet (15 mg total) by mouth 2 (two) times daily. (Patient not taking: Reported on 07/29/2020), Disp: 60 tablet, Rfl: 0 .  metoprolol tartrate (LOPRESSOR) 25 MG tablet, Take 1 tablet (25 mg total) by mouth 2 (two) times daily., Disp: 180 tablet, Rfl: 3  I spent 62 minutes dedicated to the care of this patient on the date of this encounter to include pre-visit review of records, face-to-face time with the patient discussing conditions above, post visit ordering of testing, clinical documentation with the electronic health record, making appropriate referrals as documented, and communicating necessary findings to members of the patients care team.   Garner Nash, DO [facility] 07/29/2020 11:38 AM

## 2020-07-29 NOTE — Progress Notes (Signed)
Synopsis: Referred in March 2022 for pulmonary nodule by Mackie Pai, PA-C  Subjective:   PATIENT ID: Patrick Morris: male DOB: [birth year], MRN: [id number]  Chief Complaint  Patient presents with  . Follow-up    Mostly nonproductive cough for years, sometimes productive with white sputum.     This is a 69 year old gentleman history of COPD, GERD, BPH. Current smoker 0.5 ppd. Smoked for 55+ years. Seen today for evaluation after lung cancer screening CT. patient is unfortunately still smoking.  We discussed this today.  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  [place]  Patient did enroll this past month for his first lung cancer screening CT.  He does know about his renal lesion that they attempted to freeze before.  They were unable to successfully get into that space.  He has follow-up planned for this.  From a respiratory standpoint he does have shortness of breath with exertion and cough.  He does use Advair daily.    Past Medical History:  Diagnosis Date  . BPH (benign prostatic hyperplasia)    his specialist is recommending surgery.  . Cataract   . Colitis   . COPD (chronic obstructive pulmonary disease) ([place])   . COPD exacerbation ([place]) 01/22/2014  . Ear infection   . GERD (gastroesophageal reflux disease)   . Salmonella   . Sinus infection      Family History  Problem Relation Age of Onset  . Hyperlipidemia Father   . Stroke Father   . Alcohol abuse Father   . Throat cancer Brother   . Lymphoma Brother      Past Surgical History:  Procedure Laterality Date  . IR RADIOLOGIST EVAL & MGMT  05/07/2019  . IR RADIOLOGIST EVAL & MGMT  05/27/2019  . IR RADIOLOGIST EVAL & MGMT  10/23/2019  . IR RADIOLOGIST EVAL & MGMT  01/15/2020    Social History   Socioeconomic History  . Marital status: Married    Spouse name: Not on file  . Number of children: Not on file  . Years of education: Not on file  . Highest  education level: Not on file  Occupational History  . Not on file  Tobacco Use  . Smoking status: Current Every Day Smoker    Packs/day: 1.00    Years: 55.00    Pack years: 55.00  . Smokeless tobacco: Never Used  . Tobacco comment: down to 0.5ppd  Vaping Use  . Vaping Use: Never used  Substance and Sexual Activity  . Alcohol use: Never  . Drug use: No  . Sexual activity: Not Currently    Birth control/protection: None  Other Topics Concern  . Not on file  Social History Narrative  . Not on file   Social Determinants of Health   Financial Resource Strain: Low Risk   . Difficulty of Paying Living Expenses: Not hard at all  Food Insecurity: No Food Insecurity  . Worried About Charity fundraiser in the Last Year: Never true  . Ran Out of Food in the Last Year: Never true  Transportation Needs: No Transportation Needs  . Lack of Transportation (Medical): No  . Lack of Transportation (Non-Medical): No  Physical Activity: Inactive  . Days of Exercise per Week: 0 days  . Minutes of Exercise per Session: 0 min  Stress: No Stress Concern Present  . Feeling of Stress : Not at all  Social Connections: Not on file  Intimate Partner Violence: Not on file     Allergies  Allergen Reactions  . Aspirin Other (See Comments)    Acid reflux and drooling     Outpatient Medications Prior to Visit  Medication Sig Dispense Refill  . acetaminophen (TYLENOL) 500 MG tablet Take 1,000 mg by mouth every 6 (six) hours as needed. For pain    . ADVAIR DISKUS 250-50 MCG/DOSE AEPB Inhale 1 puff into the lungs in the morning and at bedtime. 180 each 1  . ammonium lactate (AMLACTIN) 12 % cream APPLY  CREAM TOPICALLY AS NEEDED FOR DRY SKIN  TWICE DAILY 280 g 0  . azelastine (ASTELIN) 0.1 % nasal spray Place 2 sprays into both nostrils 2 (two) times daily. Use in each nostril as directed 30 mL 12  . doxazosin (CARDURA) 4 MG tablet Take 4 mg by mouth daily.    . finasteride (PROSCAR) 5 MG tablet Take  5 mg by mouth daily.     . fluticasone (FLONASE) 50 MCG/ACT nasal spray Use 2 spray(s) in each nostril once daily 16 g 1  . albuterol (VENTOLIN HFA) 108 (90 Base) MCG/ACT inhaler Inhale into the lungs. (Patient not taking: Reported on 07/29/2020)    . apixaban (ELIQUIS) 5 MG TABS tablet Take 1 tablet (5 mg total) by mouth 2 (two) times daily. (Patient not taking: Reported on 07/29/2020) 60 tablet 3  . busPIRone (BUSPAR) 15 MG tablet Take 1 tablet (15 mg total) by mouth 2 (two) times daily. (Patient not taking: Reported on 07/29/2020) 60 tablet 0  . metoprolol tartrate (LOPRESSOR) 25 MG tablet Take 1 tablet (25 mg total) by mouth 2 (two) times daily. 180 tablet 3  . predniSONE (STERAPRED UNI-PAK 21 TAB) 10 MG (21) TBPK tablet Taper over 6 days. 21 tablet 0   No facility-administered medications prior to visit.    Review of Systems  Constitutional: Negative for chills, fever, malaise/fatigue and weight loss.  HENT: Negative for hearing loss, sore throat and tinnitus.   Eyes: Negative for blurred vision and double vision.  Respiratory: Positive for cough and shortness of breath. Negative for hemoptysis, sputum production, wheezing and stridor.   Cardiovascular: Negative for chest pain, palpitations, orthopnea, leg swelling and PND.  Gastrointestinal: Negative for abdominal pain, constipation, diarrhea, heartburn, nausea and vomiting.  Genitourinary: Negative for dysuria, hematuria and urgency.  Musculoskeletal: Negative for joint pain and myalgias.  Skin: Negative for itching and rash.  Neurological: Negative for dizziness, tingling, weakness and headaches.  Endo/Heme/Allergies: Negative for environmental allergies. Does not bruise/bleed easily.  Psychiatric/Behavioral: Negative for depression. The patient is not nervous/anxious and does not have insomnia.   All other systems reviewed and are negative.    Objective:  Physical Exam Vitals reviewed.  Constitutional:      General: He is not in  acute distress.    Appearance: He is well-developed.  HENT:     Head: Normocephalic and atraumatic.  Eyes:     General: No scleral icterus.    Conjunctiva/sclera: Conjunctivae normal.     Pupils: Pupils are equal, round, and reactive to light.  Neck:     Vascular: No JVD.     Trachea: No tracheal deviation.  Cardiovascular:     Rate and Rhythm: Normal rate and regular rhythm.     Heart sounds: Normal heart sounds. No murmur heard.   Pulmonary:     Effort: Pulmonary effort is normal. No tachypnea, accessory muscle usage or respiratory distress.  Breath sounds: No stridor. No wheezing, rhonchi or rales.  Abdominal:     General: Bowel sounds are normal. There is no distension.     Palpations: Abdomen is soft.     Tenderness: There is no abdominal tenderness.  Musculoskeletal:        General: No tenderness.     Cervical back: Neck supple.  Lymphadenopathy:     Cervical: No cervical adenopathy.  Skin:    General: Skin is warm and dry.     Capillary Refill: Capillary refill takes less than 2 seconds.     Findings: No rash.  Neurological:     Mental Status: He is alert and oriented to person, place, and time.  Psychiatric:        Behavior: Behavior normal.      Vitals:   07/29/20 1131  BP: 122/76  Pulse: 67  Temp: 98.7 F (37.1 C)  TempSrc: Temporal  SpO2: 97%  Weight: 181 lb (82.1 kg)  Height: 5\' 6"  (1.676 m)   97% on RA BMI Readings from Last 3 Encounters:  07/29/20 29.21 kg/m  07/01/20 24.33 kg/m  06/22/20 27.98 kg/m   Wt Readings from Last 3 Encounters:  07/29/20 181 lb (82.1 kg)  07/01/20 160 lb (72.6 kg)  06/22/20 184 lb (83.5 kg)     CBC    Component Value Date/Time   WBC 7.9 07/01/2020 1026   RBC 4.03 (L) 07/01/2020 1026   HGB 12.6 (L) 07/01/2020 1026   HCT 35.9 (L) 07/01/2020 1026   PLT 251 07/01/2020 1026   MCV 89.1 07/01/2020 1026   MCH 31.3 07/01/2020 1026   MCHC 35.1 07/01/2020 1026   RDW 12.4 07/01/2020 1026   LYMPHSABS 0.8  07/01/2020 1026   MONOABS 0.6 07/01/2020 1026   EOSABS 0.2 07/01/2020 1026   BASOSABS 0.0 07/01/2020 1026     Chest Imaging: Lung cancer screening CT 07/14/2020: New 11.5 mm right middle lobe lung nodule concerning for malignancy.  There is also areas of tree-in-bud and small or tiny pulmonary nodules.  He also has a exophytic kidney lesion. The patient's images have been independently reviewed by me.    Pulmonary Functions Testing Results: No flowsheet data found.  FeNO:   Pathology:   Echocardiogram:   Gated Myoview perfusion Lexi scan:  Nuclear stress EF: 58%.  There was no ST segment deviation noted during stress.  There is a small defect of mild severity present in the apex location. The defect is non-reversible. In the setting of normal LVF, this is consistent with diaphragmatic attenuation artifact. No ischemia.  This is a low risk study.  The left ventricular ejection fraction is normal (55-65%).    Heart Catheterization:     Assessment & Plan:     ICD-10-CM   1. Nodule of middle lobe of right lung  R91.1 Ambulatory referral to Pulmonology    Procedural/ Surgical Case Request: VIDEO BRONCHOSCOPY WITH ENDOBRONCHIAL NAVIGATION    CT Super D Chest Wo Contrast    NM PET Image Initial (PI) Skull Base To Thigh  2. Centrilobular emphysema ([place])  J43.2   3. Current smoker  F17.200   4. Encounter for smoking cessation counseling  Z71.6   5. Abnormal CT lung screening  R91.8     Discussion:  This is a 70 year old gentleman, longstanding history of tobacco abuse greater than 55+ years, max at 3 packs/day, currently down to half a pack per day.  He does have associated centrilobular emphysema.  Previous diagnosis of COPD.  Currently managed with Advair discus.  He had an abnormal lung cancer screening CT with a dominant 11.5 mm right middle lobe lung nodule.  He also has some areas of scattered tree-in-bud with the possibility of underlying MAI.  Plan: We discussed  various options to include PET scan imaging and consideration for referral to thoracic surgery. Patient is very adamant that he would never want a big procedure. He was also adamant about not having any evaluation of this lung nodule after initial discussions. We talked about the different treatment options if this in fact was a malignancy. After discussion with patient's wife and him they decided that they would like to at least know whether or not we are dealing with a malignancy and consider biopsy.  Therefore the discussion changed to the risk benefits and alternatives of navigational bronchoscopy versus percutaneous biopsy. Patient is agreeable to proceed with navigational bronchoscopy for tissue sampling.  I did explain this was a small lesion and there was a chance for false negative results.  This based on diagnostic yield for approximate centimeter and subcentimeter lesions.  We reviewed the patient's images today in the office. He will also need a super D CT scan. We will also go ahead and obtain a nuclear medicine pet image for further evaluation of the nodule.  Patient has tentatively been scheduled for bronchoscopy with endobronchial navigation, tissue biopsy and fiducial placement on 08/12/2020.    Current Outpatient Medications:  .  acetaminophen (TYLENOL) 500 MG tablet, Take 1,000 mg by mouth every 6 (six) hours as needed. For pain, Disp: , Rfl:  .  ADVAIR DISKUS 250-50 MCG/DOSE AEPB, Inhale 1 puff into the lungs in the morning and at bedtime., Disp: 180 each, Rfl: 1 .  ammonium lactate (AMLACTIN) 12 % cream, APPLY  CREAM TOPICALLY AS NEEDED FOR DRY SKIN  TWICE DAILY, Disp: 280 g, Rfl: 0 .  azelastine (ASTELIN) 0.1 % nasal spray, Place 2 sprays into both nostrils 2 (two) times daily. Use in each nostril as directed, Disp: 30 mL, Rfl: 12 .  doxazosin (CARDURA) 4 MG tablet, Take 4 mg by mouth daily., Disp: , Rfl:  .  finasteride (PROSCAR) 5 MG tablet, Take 5 mg by mouth daily. ,  Disp: , Rfl:  .  fluticasone (FLONASE) 50 MCG/ACT nasal spray, Use 2 spray(s) in each nostril once daily, Disp: 16 g, Rfl: 1 .  albuterol (VENTOLIN HFA) 108 (90 Base) MCG/ACT inhaler, Inhale into the lungs. (Patient not taking: Reported on 07/29/2020), Disp: , Rfl:  .  apixaban (ELIQUIS) 5 MG TABS tablet, Take 1 tablet (5 mg total) by mouth 2 (two) times daily. (Patient not taking: Reported on 07/29/2020), Disp: 60 tablet, Rfl: 3 .  busPIRone (BUSPAR) 15 MG tablet, Take 1 tablet (15 mg total) by mouth 2 (two) times daily. (Patient not taking: Reported on 07/29/2020), Disp: 60 tablet, Rfl: 0 .  metoprolol tartrate (LOPRESSOR) 25 MG tablet, Take 1 tablet (25 mg total) by mouth 2 (two) times daily., Disp: 180 tablet, Rfl: 3  I spent 62 minutes dedicated to the care of this patient on the date of this encounter to include pre-visit review of records, face-to-face time with the patient discussing conditions above, post visit ordering of testing, clinical documentation with the electronic health record, making appropriate referrals as documented, and communicating necessary findings to members of the patients care team.   Garner Nash, DO [facility] 07/29/2020 11:38 AM

## 2020-07-29 NOTE — Telephone Encounter (Signed)
Pt made aware of the following:  PET: 08/02/20 at 3:00pm; arrival time 2:30pm. NPO 6hrs prior.  CT: 08/03/20 at 2:30pm; arrival time 2:15pm. No prep. (Disk being sent to [facility] per Manuela Schwartz)  Covid Test: 08/06/20 at 2:00pm  Bronch: 08/10/20 at 7:30am; arrival time 5:30am. NPO after midnight.

## 2020-07-29 NOTE — Patient Instructions (Addendum)
Thank you for visiting Dr. Valeta Harms at [facility]. Today we recommend the following:  Orders Placed This Encounter  Procedures  . Procedural/ Surgical Case Request: VIDEO BRONCHOSCOPY WITH ENDOBRONCHIAL NAVIGATION  . CT Super D Chest Wo Contrast  . NM PET Image Initial (PI) Skull Base To Thigh  . Ambulatory referral to Pulmonology   Bronchoscopy will be scheduled for 08/12/2020 Nothing to eat or drink past midnight the night before  Please expect phone calls from pre-op/pre-admission testing  Return in about 2 months (around 09/28/2020) for with APP or Dr. Valeta Harms.    Please do your part to reduce the spread of COVID-19.     You must quit smoking or vaping. This is the single most important thing that you can do to improve your lung health.   S = Set a quit date. T = Tell family, friends, and the people around you that you plan to quit. A = Anticipate or plan ahead for the tough times you'll face while quitting. R = Remove cigarettes and other tobacco products from your home, car, and work T = Talk to [place] about getting help to quit  If you need help feel free to reach out to our office, [place] Smoking Cessation Class: 7734493764, call 1-800-QUIT-NOW, or visit [email or web address]/.    Smoking Tobacco Information, Adult Smoking tobacco can be harmful to your health. Tobacco contains a poisonous (toxic), colorless chemical called nicotine. Nicotine is addictive. It changes the brain and can make it hard to stop smoking. Tobacco also has other toxic chemicals that can hurt your body and raise your risk of many cancers. How can smoking tobacco affect me? Smoking tobacco puts you at risk for:  Cancer. Smoking is most commonly associated with lung cancer, but can also lead to cancer in other parts of the body.  Chronic obstructive pulmonary disease (COPD). This is a long-term lung condition that makes it hard to breathe. It also gets worse over time.  High  blood pressure (hypertension), heart disease, stroke, or heart attack.  Lung infections, such as pneumonia.  Cataracts. This is when the lenses in the eyes become clouded.  Digestive problems. This may include peptic ulcers, heartburn, and gastroesophageal reflux disease (GERD).  Oral health problems, such as gum disease and tooth loss.  Loss of taste and smell. Smoking can affect your appearance by causing:  Wrinkles.  Yellow or stained teeth, fingers, and fingernails. Smoking tobacco can also affect your social life, because:  It may be challenging to find places to smoke when away from home. Many workplaces, [employer], hotels, and public places are tobacco-free.  Smoking is expensive. This is due to the cost of tobacco and the long-term costs of treating health problems from smoking.  Secondhand smoke may affect those around you. Secondhand smoke can cause lung cancer, breathing problems, and heart disease. Children of smokers have a higher risk for: ? Sudden infant death syndrome (SIDS). ? Ear infections. ? Lung infections. If you currently smoke tobacco, quitting now can help you:  Lead a longer and healthier life.  Look, smell, breathe, and feel better over time.  Save money.  Protect others from the harms of secondhand smoke. What actions can I take to prevent health problems? Quit smoking  Do not start smoking. Quit if you already do.  Make a plan to quit smoking and commit to it. Look for programs to help you and ask your health care provider for recommendations and ideas.  Set a date  and write down all the reasons you want to quit.  Let your friends and family know you are quitting so they can help and support you. Consider finding friends who also want to quit. It can be easier to quit with someone else, so that you can support each other.  Talk with your health care provider about using nicotine replacement medicines to help you quit, such as gum, lozenges,  patches, sprays, or pills.  Do not replace cigarette smoking with electronic cigarettes, which are commonly called e-cigarettes. The safety of e-cigarettes is not known, and some may contain harmful chemicals.  If you try to quit but return to smoking, stay positive. It is common to slip up when you first quit, so take it one day at a time.  Be prepared for cravings. When you feel the urge to smoke, chew gum or suck on hard candy.   Lifestyle  Stay busy and take care of your body.  Drink enough fluid to keep your urine pale yellow.  Get plenty of exercise and eat a healthy diet. This can help prevent weight gain after quitting.  Monitor your eating habits. Quitting smoking can cause you to have a larger appetite than when you smoke.  Find ways to relax. Go out with friends or family to a movie or a restaurant where people do not smoke.  Ask your health care provider about having regular tests (screenings) to check for cancer. This may include blood tests, imaging tests, and other tests.  Find ways to manage your stress, such as meditation, yoga, or exercise. Where to find support To get support to quit smoking, consider:  Asking your health care provider for more information and resources.  Taking classes to learn more about quitting smoking.  Looking for local organizations that offer resources about quitting smoking.  Joining a support group for people who want to quit smoking in your local community.  Calling the smokefree.gov counselor helpline: 1-800-Quit-Now (934)185-0337) Where to find more information You may find more information about quitting smoking from:  HelpGuide.org: www.helpguide.org  https://hall.com/: smokefree.gov  American Lung Association: www.lung.org Contact a health care provider if you:  Have problems breathing.  Notice that your lips, nose, or fingers turn blue.  Have chest pain.  Are coughing up blood.  Feel faint or you pass out.  Have  other health changes that cause you to worry. Summary  Smoking tobacco can negatively affect your health, the health of those around you, your finances, and your social life.  Do not start smoking. Quit if you already do. If you need help quitting, ask your health care provider.  Think about joining a support group for people who want to quit smoking in your local community. There are many effective programs that will help you to quit this behavior. This information is not intended to replace advice given to you by your health care provider. Make sure you discuss any questions you have with your health care provider. Document Revised: 01/10/2019 Document Reviewed: 05/02/2016 Elsevier Patient Education  2021 [employer].

## 2020-08-02 ENCOUNTER — Other Ambulatory Visit: Payer: Self-pay

## 2020-08-02 ENCOUNTER — Encounter (HOSPITAL_COMMUNITY)
Admission: RE | Admit: 2020-08-02 | Discharge: 2020-08-02 | Disposition: A | Discharge status: Home / Self Care | Payer: Medicare Other | Source: Ambulatory Visit | Attending: Pulmonary Disease | Admitting: Pulmonary Disease

## 2020-08-02 DIAGNOSIS — I700 Atherosclerosis of aorta: Secondary | ICD-10-CM | POA: Diagnosis not present

## 2020-08-02 DIAGNOSIS — I2510 Atherosclerotic heart disease of native coronary artery without angina pectoris: Secondary | ICD-10-CM | POA: Diagnosis not present

## 2020-08-02 DIAGNOSIS — R911 Solitary pulmonary nodule: Secondary | ICD-10-CM

## 2020-08-02 DIAGNOSIS — J439 Emphysema, unspecified: Secondary | ICD-10-CM | POA: Diagnosis not present

## 2020-08-02 LAB — GLUCOSE, CAPILLARY: Glucose-Capillary: 94 mg/dL (ref 70–99)

## 2020-08-02 MED ORDER — FLUDEOXYGLUCOSE F - 18 (FDG) INJECTION
9.0000 | Freq: Once | INTRAVENOUS | Status: AC | PRN
  Administered 2020-08-02: 9.7 via INTRAVENOUS

## 2020-08-03 ENCOUNTER — Encounter (HOSPITAL_COMMUNITY): Payer: Self-pay

## 2020-08-03 ENCOUNTER — Ambulatory Visit (HOSPITAL_COMMUNITY)
Admission: RE | Admit: 2020-08-03 | Discharge: 2020-08-03 | Disposition: A | Discharge status: Home / Self Care | Payer: Medicare Other | Source: Ambulatory Visit | Attending: Pulmonary Disease | Admitting: Pulmonary Disease

## 2020-08-03 DIAGNOSIS — I2510 Atherosclerotic heart disease of native coronary artery without angina pectoris: Secondary | ICD-10-CM | POA: Insufficient documentation

## 2020-08-03 DIAGNOSIS — R911 Solitary pulmonary nodule: Secondary | ICD-10-CM | POA: Insufficient documentation

## 2020-08-03 DIAGNOSIS — I700 Atherosclerosis of aorta: Secondary | ICD-10-CM | POA: Insufficient documentation

## 2020-08-03 DIAGNOSIS — J439 Emphysema, unspecified: Secondary | ICD-10-CM | POA: Insufficient documentation

## 2020-08-06 ENCOUNTER — Other Ambulatory Visit (HOSPITAL_COMMUNITY)
Admission: RE | Admit: 2020-08-06 | Discharge: 2020-08-06 | Disposition: A | Discharge status: Home / Self Care | Payer: Medicare Other | Source: Ambulatory Visit | Attending: Pulmonary Disease | Admitting: Pulmonary Disease

## 2020-08-06 DIAGNOSIS — Z20822 Contact with and (suspected) exposure to covid-19: Secondary | ICD-10-CM | POA: Diagnosis not present

## 2020-08-06 DIAGNOSIS — Z01812 Encounter for preprocedural laboratory examination: Secondary | ICD-10-CM | POA: Diagnosis present

## 2020-08-07 LAB — SARS CORONAVIRUS 2 (TAT 6-24 HRS): SARS Coronavirus 2: NEGATIVE

## 2020-08-09 ENCOUNTER — Encounter (HOSPITAL_COMMUNITY): Payer: Self-pay | Admitting: Pulmonary Disease

## 2020-08-09 ENCOUNTER — Other Ambulatory Visit: Payer: Self-pay

## 2020-08-09 NOTE — Progress Notes (Signed)
PCP - Mackie Pai, PA Cardiologist - Dr. Bettina Gavia  PPM/ICD - denies   Chest x-ray - Super D chest CT - 08/04/20 EKG - 03/10/20 Stress Test - 03/22/20 ECHO - denies Cardiac Cath - denies  Sleep Study - denies    Blood Thinner Instructions: pt was taking Eliquis but stopped it himself a couple of weeks ago (per his wife).  Wife reported that patient stopped Eliquis due to rectal bleeding with bowel movement.   Encouraged wife to have patient reach out to Dr. Bettina Gavia.      COVID TEST- 08/06/20   Anesthesia review: yes  Patient denies shortness of breath, fever, cough and chest pain at PAT appointment   All instructions explained to the patient, with a verbal understanding of the material. Patient also instructed to self quarantine after being tested for COVID-19. The opportunity to ask questions was provided.

## 2020-08-09 NOTE — Progress Notes (Signed)
Anesthesia Chart Review: Same day workup  Recently underwent cardiology evaluation for atypical chest pain.  Nuclear stress test done November 2021 was nonischemic, low risk, normal EF.  He did also wear an event monitor that showed atypical atrial flutter.  He last saw Dr. Bettina Gavia 04/13/2020 and was started on Eliquis.  At that time patient declined referral to electrophysiology. It was discussed that if he had breakthrough episodes of symptomatic flutter he would be referred to EP.  He was advised to follow-up in 6 months with Dr. Bettina Gavia.  Patient reportedly stopped Eliquis of his own volition a few weeks ago secondary to rectal bleeding with bowel movements.  Patient was encouraged to reach out to Dr. Bettina Gavia to discuss.   Current everyday smoker with history of COPD maintained on Advair Diskus.  Recent screening CT scan showed right middle lobe lung nodule.    He will need day of surgery labs and evaluation  EKG 03/10/2020: Sinus rhythm with first-degree AV block with premature atrial complexes with aberrant conduction.  Rate 95.  CT super D chest 08/03/2020: IMPRESSION: 1. Redemonstrated, spiculated nodule of the medial segment right middle lobe measuring 1.5 x 1.0 cm. This nodule is previously FDG PET avid and findings remain consistent with primary lung malignancy. 2. Additional sub solid irregular nodule of the inferior medial segment right middle lobe measuring 0.8 cm, indeterminate and nonspecific, at or below FDG PET resolution. Continued attention on follow-up. 3. Emphysema 4. Diffuse bilateral bronchial wall thickening, consistent with nonspecific infectious or inflammatory bronchitis. 5. Coronary artery disease.  Event monitor 03/29/2020: A ZIO monitor was performed for 7 days beginning 03/10/2020 to assess for rapid heart rhythm.  Baseline rhythm is sinus with minimum, average and maximum heart rates of 52, 76 and 124 bpm.  There are no pauses of 3 seconds or greater and no  episodes of second or third-degree AV nodal block or sinus node exit block.  Overall supraventricular ectopy was rare. There were episodes of slow atrial flutter atrial tachycardia with a cycle length of 0.32 seconds with variable conduction.  There was persistent SVT at the same rate noted 31% of the time.  I suspect this represents one-to-one conduction. There are no episodes of atrial fibrillation  There were no triggered or diary events  Patient was initiated on beta-blocker with an appointment to see EP.   Nuclear stress 03/22/2020:  Nuclear stress EF: 58%.  There was no ST segment deviation noted during stress.  There is a small defect of mild severity present in the apex location. The defect is non-reversible. In the setting of normal LVF, this is consistent with diaphragmatic attenuation artifact. No ischemia.  This is a low risk study.  The left ventricular ejection fraction is normal (55-65%).      Wynonia Musty [facility]/Anesthesiology Phone [phone] 08/09/2020 1:19 PM

## 2020-08-09 NOTE — Anesthesia Preprocedure Evaluation (Addendum)
Anesthesia Evaluation  Patient identified by MRN, date of birth, ID band Patient awake    Reviewed: Allergy & Precautions, NPO status , Patient's Chart, lab work & pertinent test results  Airway Mallampati: II  TM Distance: >3 FB Neck ROM: Full    Dental  (+) Edentulous Upper, Missing, Poor Dentition, Dental Advisory Given,    Pulmonary COPD,  COPD inhaler, Current Smoker and Patient abstained from smoking.,  RML nodule  Current smoker, 55 pack year history    Pulmonary exam normal breath sounds clear to auscultation       Cardiovascular + CAD  Normal cardiovascular exam+ dysrhythmias (eliquis) Atrial Fibrillation  Rhythm:Regular Rate:Normal  Recently underwent cardiology evaluation for atypical chest pain.  Nuclear stress test done November 2021 was nonischemic, low risk, normal EF.  He did also wear an event monitor that showed atypical atrial flutter.  He last saw Dr. Bettina Gavia 04/13/2020 and was started on Eliquis.  At that time patient declined referral to electrophysiology. It was discussed that if he had breakthrough episodes of symptomatic flutter he would be referred to EP.  He was advised to follow-up in 6 months with Dr. Bettina Gavia. Patient reportedly stopped Eliquis of his own volition a few weeks ago secondary to rectal bleeding with bowel movements   Neuro/Psych negative neurological ROS  negative psych ROS   GI/Hepatic Neg liver ROS, GERD  ,  Endo/Other  negative endocrine ROS  Renal/GU negative Renal ROS  negative genitourinary   Musculoskeletal negative musculoskeletal ROS (+)   Abdominal   Peds  Hematology negative hematology ROS (+)   Anesthesia Other Findings   Reproductive/Obstetrics negative OB ROS                           Anesthesia Physical Anesthesia Plan  ASA: III  Anesthesia Plan: General   Post-op Pain Management:    Induction: Intravenous  PONV Risk Score and Plan:  Treatment may vary due to age or medical condition, Ondansetron and Dexamethasone  Airway Management Planned: Oral ETT  Additional Equipment: None  Intra-op Plan:   Post-operative Plan: Extubation in OR  Informed Consent:   Plan Discussed with:   Anesthesia Plan Comments:        Anesthesia Quick Evaluation

## 2020-08-10 ENCOUNTER — Observation Stay (HOSPITAL_COMMUNITY): Payer: Medicare Other

## 2020-08-10 ENCOUNTER — Ambulatory Visit (HOSPITAL_COMMUNITY): Payer: Medicare Other

## 2020-08-10 ENCOUNTER — Encounter (HOSPITAL_COMMUNITY): Payer: Self-pay | Admitting: Pulmonary Disease

## 2020-08-10 ENCOUNTER — Observation Stay (HOSPITAL_COMMUNITY)
Admission: RE | Admit: 2020-08-10 | Discharge: 2020-08-11 | Disposition: A | Discharge status: Home / Self Care | Payer: Medicare Other | Attending: Pulmonary Disease | Admitting: Pulmonary Disease

## 2020-08-10 ENCOUNTER — Ambulatory Visit (HOSPITAL_COMMUNITY): Payer: Medicare Other | Admitting: Physician Assistant

## 2020-08-10 ENCOUNTER — Other Ambulatory Visit: Payer: Self-pay

## 2020-08-10 ENCOUNTER — Encounter (HOSPITAL_COMMUNITY)
Admission: RE | Disposition: A | Discharge status: Home / Self Care | Payer: Self-pay | Source: Home / Self Care | Attending: Pulmonary Disease

## 2020-08-10 DIAGNOSIS — Z79899 Other long term (current) drug therapy: Secondary | ICD-10-CM | POA: Diagnosis not present

## 2020-08-10 DIAGNOSIS — Z9889 Other specified postprocedural states: Secondary | ICD-10-CM

## 2020-08-10 DIAGNOSIS — R911 Solitary pulmonary nodule: Principal | ICD-10-CM

## 2020-08-10 DIAGNOSIS — J449 Chronic obstructive pulmonary disease, unspecified: Secondary | ICD-10-CM | POA: Insufficient documentation

## 2020-08-10 DIAGNOSIS — Z7901 Long term (current) use of anticoagulants: Secondary | ICD-10-CM | POA: Diagnosis not present

## 2020-08-10 DIAGNOSIS — J939 Pneumothorax, unspecified: Secondary | ICD-10-CM

## 2020-08-10 DIAGNOSIS — Z7951 Long term (current) use of inhaled steroids: Secondary | ICD-10-CM | POA: Insufficient documentation

## 2020-08-10 DIAGNOSIS — N401 Enlarged prostate with lower urinary tract symptoms: Secondary | ICD-10-CM | POA: Insufficient documentation

## 2020-08-10 DIAGNOSIS — J9383 Other pneumothorax: Secondary | ICD-10-CM | POA: Insufficient documentation

## 2020-08-10 DIAGNOSIS — K219 Gastro-esophageal reflux disease without esophagitis: Secondary | ICD-10-CM | POA: Diagnosis not present

## 2020-08-10 DIAGNOSIS — R058 Other specified cough: Secondary | ICD-10-CM | POA: Diagnosis present

## 2020-08-10 DIAGNOSIS — F17210 Nicotine dependence, cigarettes, uncomplicated: Secondary | ICD-10-CM | POA: Insufficient documentation

## 2020-08-10 HISTORY — DX: Atherosclerotic heart disease of native coronary artery without angina pectoris: I25.10

## 2020-08-10 HISTORY — PX: BRONCHIAL BIOPSY: SHX5109

## 2020-08-10 HISTORY — DX: Unspecified hearing loss, unspecified ear: H91.90

## 2020-08-10 HISTORY — PX: FIDUCIAL MARKER PLACEMENT: SHX6858

## 2020-08-10 HISTORY — PX: BRONCHIAL WASHINGS: SHX5105

## 2020-08-10 HISTORY — PX: ENDOBRONCHIAL ULTRASOUND: SHX5096

## 2020-08-10 HISTORY — DX: Pneumothorax, unspecified: J93.9

## 2020-08-10 HISTORY — DX: Atypical atrial flutter: I48.4

## 2020-08-10 HISTORY — PX: VIDEO BRONCHOSCOPY WITH ENDOBRONCHIAL NAVIGATION: SHX6175

## 2020-08-10 HISTORY — PX: BRONCHIAL BRUSHINGS: SHX5108

## 2020-08-10 HISTORY — DX: Long term (current) use of anticoagulants: Z79.01

## 2020-08-10 HISTORY — PX: BRONCHIAL NEEDLE ASPIRATION BIOPSY: SHX5106

## 2020-08-10 LAB — BASIC METABOLIC PANEL
Anion gap: 9 (ref 5–15)
BUN: 15 mg/dL (ref 8–23)
CO2: 23 mmol/L (ref 22–32)
Calcium: 8.8 mg/dL — ABNORMAL LOW (ref 8.9–10.3)
Chloride: 107 mmol/L (ref 98–111)
Creatinine, Ser: 0.61 mg/dL (ref 0.61–1.24)
GFR, Estimated: >60 mL/min (ref >60)
Glucose, Bld: 100 mg/dL — ABNORMAL HIGH (ref 70–99)
Potassium: 3.5 mmol/L (ref 3.5–5.1)
Sodium: 139 mmol/L (ref 135–145)

## 2020-08-10 LAB — CBC
HCT: 40.8 % (ref 39.0–52.0)
Hemoglobin: 13.9 g/dL (ref 13.0–17.0)
MCH: 30.7 pg (ref 26.0–34.0)
MCHC: 34.1 g/dL (ref 30.0–36.0)
MCV: 90.1 fL (ref 80.0–100.0)
Platelets: 294 10*3/uL (ref 150–400)
RBC: 4.53 MIL/uL (ref 4.22–5.81)
RDW: 12.5 % (ref 11.5–15.5)
WBC: 6.4 10*3/uL (ref 4.0–10.5)
nRBC: 0 % (ref 0.0–0.2)

## 2020-08-10 SURGERY — VIDEO BRONCHOSCOPY WITH ENDOBRONCHIAL NAVIGATION
Anesthesia: General | Laterality: Right

## 2020-08-10 MED ORDER — HEPARIN SODIUM (PORCINE) 5000 UNIT/ML IJ SOLN
5000.0000 [IU] | Freq: Three times a day (TID) | INTRAMUSCULAR | Status: DC
  Administered 2020-08-10 – 2020-08-11 (×2): 5000 [IU] via SUBCUTANEOUS
  Filled 2020-08-10 (×2): qty 1

## 2020-08-10 MED ORDER — LIDOCAINE 2% (20 MG/ML) 5 ML SYRINGE
INTRAMUSCULAR | Status: DC | PRN
  Administered 2020-08-10: 60 mg via INTRAVENOUS

## 2020-08-10 MED ORDER — ROCURONIUM BROMIDE 100 MG/10ML IV SOLN
INTRAVENOUS | Status: DC | PRN
  Administered 2020-08-10: 20 mg via INTRAVENOUS
  Administered 2020-08-10: 30 mg via INTRAVENOUS
  Administered 2020-08-10: 10 mg via INTRAVENOUS

## 2020-08-10 MED ORDER — PROPOFOL 10 MG/ML IV BOLUS
INTRAVENOUS | Status: DC | PRN
  Administered 2020-08-10: 100 mg via INTRAVENOUS

## 2020-08-10 MED ORDER — PHENYLEPHRINE 40 MCG/ML (10ML) SYRINGE FOR IV PUSH (FOR BLOOD PRESSURE SUPPORT)
PREFILLED_SYRINGE | INTRAVENOUS | Status: DC | PRN
  Administered 2020-08-10 (×2): 40 ug via INTRAVENOUS

## 2020-08-10 MED ORDER — METOPROLOL TARTRATE 5 MG/5ML IV SOLN
INTRAVENOUS | Status: AC
  Administered 2020-08-10: 5 mg
  Filled 2020-08-10: qty 5

## 2020-08-10 MED ORDER — SUCCINYLCHOLINE CHLORIDE 200 MG/10ML IV SOSY
PREFILLED_SYRINGE | INTRAVENOUS | Status: DC | PRN
  Administered 2020-08-10: 160 mg via INTRAVENOUS

## 2020-08-10 MED ORDER — LACTATED RINGERS IV SOLN: INTRAVENOUS | Status: DC

## 2020-08-10 MED ORDER — ACETAMINOPHEN 650 MG RE SUPP: 650.0000 mg | Freq: Four times a day (QID) | RECTAL | Status: DC | PRN

## 2020-08-10 MED ORDER — MIDAZOLAM HCL 5 MG/5ML IJ SOLN
INTRAMUSCULAR | Status: DC | PRN
  Administered 2020-08-10: 1 mg via INTRAVENOUS

## 2020-08-10 MED ORDER — DEXAMETHASONE SODIUM PHOSPHATE 10 MG/ML IJ SOLN
INTRAMUSCULAR | Status: DC | PRN
  Administered 2020-08-10: 10 mg via INTRAVENOUS

## 2020-08-10 MED ORDER — METOPROLOL TARTRATE 25 MG PO TABS
25.0000 mg | ORAL_TABLET | Freq: Two times a day (BID) | ORAL | Status: DC
  Administered 2020-08-10 – 2020-08-11 (×2): 25 mg via ORAL
  Filled 2020-08-10 (×2): qty 1

## 2020-08-10 MED ORDER — FENTANYL CITRATE (PF) 100 MCG/2ML IJ SOLN
25.0000 ug | INTRAMUSCULAR | Status: DC | PRN
  Administered 2020-08-10 (×2): 50 ug via INTRAVENOUS

## 2020-08-10 MED ORDER — METOPROLOL TARTRATE 5 MG/5ML IV SOLN: 5.0000 mg | Freq: Once | INTRAVENOUS | Status: DC

## 2020-08-10 MED ORDER — HYDROCODONE-ACETAMINOPHEN 5-325 MG PO TABS
1.0000 | ORAL_TABLET | ORAL | Status: DC | PRN
Start: 2020-08-10 — End: 2020-08-11
  Administered 2020-08-10: 1 via ORAL
  Filled 2020-08-10: qty 1

## 2020-08-10 MED ORDER — FENTANYL CITRATE (PF) 100 MCG/2ML IJ SOLN
INTRAMUSCULAR | Status: DC | PRN
  Administered 2020-08-10: 50 ug via INTRAVENOUS

## 2020-08-10 MED ORDER — FENTANYL CITRATE (PF) 100 MCG/2ML IJ SOLN
INTRAMUSCULAR | Status: AC
  Filled 2020-08-10: qty 2

## 2020-08-10 MED ORDER — ACETAMINOPHEN 325 MG PO TABS: 650.0000 mg | ORAL_TABLET | Freq: Four times a day (QID) | ORAL | Status: DC | PRN

## 2020-08-10 MED ORDER — SUGAMMADEX SODIUM 200 MG/2ML IV SOLN
INTRAVENOUS | Status: DC | PRN
  Administered 2020-08-10: 150 mg via INTRAVENOUS

## 2020-08-10 MED ORDER — LACTATED RINGERS IV SOLN: INTRAVENOUS | Status: DC | PRN

## 2020-08-10 MED ORDER — ONDANSETRON HCL 4 MG/2ML IJ SOLN
INTRAMUSCULAR | Status: DC | PRN
  Administered 2020-08-10: 4 mg via INTRAVENOUS

## 2020-08-10 MED ORDER — METOPROLOL TARTRATE 25 MG PO TABS: 25.0000 mg | ORAL_TABLET | Freq: Once | ORAL | Status: DC

## 2020-08-10 SURGICAL SUPPLY — 46 items

## 2020-08-10 NOTE — Interval H&P Note (Signed)
History and Physical Interval Note:  08/10/2020 7:16 AM  Patrick Morris  has presented today for surgery, with the diagnosis of lung nodule.  The various methods of treatment have been discussed with the patient and family. After consideration of risks, benefits and other options for treatment, the patient has consented to  Procedure(s): [place] (Right) as a surgical intervention.  The patient's history has been reviewed, patient examined, no change in status, stable for surgery.  I have reviewed the patient's chart and labs.  Questions were answered to the patient's satisfaction.     [place]

## 2020-08-10 NOTE — Progress Notes (Signed)
PCCM:  Post-bronchoscopy pneumothorax.   We will observe on nasal cannula O2  Repeat CXR in a few hours.   If worsening will consider aspiration or pigtail placement.   Garner Nash, DO [facility] 08/10/2020 10:46 AM

## 2020-08-10 NOTE — Discharge Instructions (Signed)
Flexible Bronchoscopy, Care After This sheet gives you information about how to care for yourself after your test. Your doctor may also give you more specific instructions. If you have problems or questions, contact your doctor. Follow these instructions at home: Eating and drinking  Do not eat or drink anything (not even water) for 2 hours after your test, or until your numbing medicine (local anesthetic) wears off.  When your numbness is gone and your cough and gag reflexes have come back, you may: ? Eat only soft foods. ? Slowly drink liquids.  The day after the test, go back to your normal diet. Driving  Do not drive for 24 hours if you were given a medicine to help you relax (sedative).  Do not drive or use heavy machinery while taking prescription pain medicine. General instructions   Take over-the-counter and prescription medicines only as told by your doctor.  Return to your normal activities as told. Ask what activities are safe for you.  Do not use any products that have nicotine or tobacco in them. This includes cigarettes and e-cigarettes. If you need help quitting, ask your doctor.  Keep all follow-up visits as told by your doctor. This is important. It is very important if you had a tissue sample (biopsy) taken. Get help right away if:  You have shortness of breath that gets worse.  You get light-headed.  You feel like you are going to pass out (faint).  You have chest pain.  You cough up: ? More than a little blood. ? More blood than before. Summary  Do not eat or drink anything (not even water) for 2 hours after your test, or until your numbing medicine wears off.  Do not use cigarettes. Do not use e-cigarettes.  Get help right away if you have chest pain.   This information is not intended to replace advice given to you by your health care provider. Make sure you discuss any questions you have with your health care provider. Document Released:  02/12/2009 Document Revised: 03/30/2017 Document Reviewed: 05/05/2016 Elsevier Patient Education  2020 [employer].

## 2020-08-10 NOTE — Anesthesia Procedure Notes (Signed)
Procedure Name: Intubation Date/Time: 08/10/2020 7:05 AM Performed by: Gwyndolyn Saxon, CRNA Pre-anesthesia Checklist: Patient identified, Emergency Drugs available, Suction available and Patient being monitored Patient Re-evaluated:Patient Re-evaluated prior to induction Oxygen Delivery Method: Circle system utilized Preoxygenation: Pre-oxygenation with 100% oxygen Induction Type: IV induction and Rapid sequence Laryngoscope Size: Miller and 2 Grade View: Grade I Tube type: Oral Tube size: 8.5 mm Number of attempts: 1 Airway Equipment and Method: Patient positioned with wedge pillow and Stylet Placement Confirmation: ETT inserted through vocal cords under direct vision,  positive ETCO2 and breath sounds checked- equal and bilateral Secured at: 24 cm Tube secured with: Tape Dental Injury: Teeth and Oropharynx as per pre-operative assessment  Comments: Doctor asked for quick intubation with minimal ventilation

## 2020-08-10 NOTE — Op Note (Addendum)
Video Bronchoscopy with Electromagnetic Navigation With Fiducial Placement Procedure Note Video Bronchoscopy with Endobronchial Ultrasound Procedure Note  Date of Operation: 08/10/2020  Pre-op Diagnosis: Lung nodules  Post-op Diagnosis: Lung nodules   Surgeon: Garner Nash, DO   Assistants: None   Anesthesia: General endotracheal anesthesia  Operation: Flexible video fiberoptic bronchoscopy with electromagnetic navigation and biopsies.  Estimated Blood Loss: Minimal  Complications: none   Indications and History: Patrick Morris is a 70 y.o. male with lung nodules.  The risks, benefits, complications, treatment options and expected outcomes were discussed with the patient.  The possibilities of pneumothorax, pneumonia, reaction to medication, pulmonary aspiration, perforation of a viscus, bleeding, failure to diagnose a condition and creating a complication requiring transfusion or operation were discussed with the patient who freely signed the consent.    Description of Procedure: The patient was seen in the Preoperative Area, was examined and was deemed appropriate to proceed.  The patient was taken to [facility] endoscopy room 2, identified as Patrick Morris and the procedure verified as Flexible Video Fiberoptic Bronchoscopy.  A Time Out was held and the above information confirmed.   Prior to the date of the procedure a high-resolution CT scan of the chest was performed. Utilizing [place] a virtual tracheobronchial tree was generated to allow the creation of distinct navigation pathways to the patient's parenchymal abnormalities. After being taken to the operating room general anesthesia was initiated and the patient  was orally intubated. The video fiberoptic bronchoscope was introduced via the endotracheal tube and a general inspection was performed which showed normal right and left lung anatomy with no evidence of endobronchial lesion. The extendable working channel  and locator guide were introduced into the bronchoscope.   Target #1 right middle lobe: The distinct navigation pathways prepared prior to this procedure were then utilized to navigate to within 0.8 cm of patient's lesion(s) identified on CT scan.  Full fluoroscopic sweep was obtained from LAO 25 degrees to RAO 25 degrees with inspiratory breath-hold APL at 20 cm of water.  This was completed for fluoroscopic navigation.  The extendable working channel was secured into place and the locator guide was withdrawn. Under fluoroscopic guidance transbronchial needle brushings, transbronchial Wang needle biopsies, and transbronchial forceps biopsies were performed to be sent for cytology and pathology.  Following tissue sampling 3 fiducials were placed in 3 separate axial planes at approximately 2.5 cm from lesional center as identified by software. A bronchioalveolar lavage was performed in the right middle lobe and sent for cytology.  Target #2 right upper lobe:  The distinct navigation pathways prepared prior to this procedure were then utilized to navigate to within 1.7 cm of patient's lesion(s) identified on CT scan.  Full fluoroscopic sweep was obtained from LAO 25 degrees RAO 25 degrees with inspiratory breath-hold APL at 20 cm water.  This was completed for fluoroscopic navigation. The extendable working channel was secured into place and the locator guide was withdrawn. Under fluoroscopic guidance transbronchial needle brushings, transbronchial Wang needle biopsies, and transbronchial forceps biopsies were performed to be sent for cytology and pathology. A bronchioalveolar lavage was performed in the right upper lobe and sent for cytology and microbiology (bacterial, fungal, AFB smears and cultures).   Endobronchial ultrasound Procedure: The standard scope was then withdrawn and the endobronchial ultrasound was used to identify and characterize the peritracheal, hilar and bronchial lymph nodes.  Inspection showed normal-appearing lymph nodes within the left and subcarinal space.. Using real-time ultrasound guidance Wang needle  biopsies were take from [place] and were sent for cytology. The patient tolerated the procedure well without apparent complications. There was no significant blood loss.   At the end of the procedure a general airway inspection was performed and there was no evidence of active bleeding.  The standard therapeutic bronchoscope was inserted into the airway and aspiration of the bilateral mainstem's was necessary for removing mucous plugging and any remaining blood clots.  At the end of the procedure all distal subsegments were patent. The bronchoscope was removed.  The patient tolerated the procedure well. There was no significant blood loss and there were no obvious complications. A post-procedural chest x-ray is pending.  Samples Target #1 right middle lobe: 1. Transbronchial needle brushings from RML 2. Transbronchial Wang needle biopsies from RML 3. Transbronchial forceps biopsies from RML 4. Bronchoalveolar lavage from RML  Samples Target #2 right upper lobe: 1. Transbronchial needle brushings from RUL 2. Transbronchial Wang needle biopsies from RUL 3. Transbronchial forceps biopsies from RUL 4. Bronchoalveolar lavage from RUL 5. Transbronchial biopsies sent for tissue culture from RUL  Samples EBUS:  1.  Transbronchial needle aspirations, Station 10 R  Plans:  The patient will be discharged from the PACU to home when recovered from anesthesia and after chest x-ray is reviewed. We will review the cytology, pathology and microbiology results with the patient when they become available. Outpatient followup will be with Garner Nash, DO.   Garner Nash, DO [facility] 08/10/2020 9:09 AM

## 2020-08-10 NOTE — Transfer of Care (Signed)
Immediate Anesthesia Transfer of Care Note  Patient: Patrick Morris  Procedure(s) Performed: VIDEO BRONCHOSCOPY WITH ENDOBRONCHIAL NAVIGATION (Right ) BRONCHIAL BIOPSIES BRONCHIAL BRUSHINGS BRONCHIAL NEEDLE ASPIRATION BIOPSIES FIDUCIAL MARKER PLACEMENT ENDOBRONCHIAL ULTRASOUND BRONCHIAL WASHINGS  Patient Location: PACU  Anesthesia Type:General  Level of Consciousness: drowsy and patient cooperative  Airway & Oxygen Therapy: Patient Spontanous Breathing and Patient connected to face mask oxygen  Post-op Assessment: Report given to RN and Post -op Vital signs reviewed and stable  Post vital signs: Reviewed and stable  Last Vitals:  Vitals Value Taken Time  BP 136/70 08/10/20 0915  Temp 36.1 C 08/10/20 0915  Pulse 73 08/10/20 0921  Resp 22 08/10/20 0921  SpO2 91 % 08/10/20 0921  Vitals shown include unvalidated device data.  Last Pain:  Vitals:   08/10/20 0915  TempSrc:   PainSc: 0-No pain         Complications: No complications documented.

## 2020-08-10 NOTE — H&P (Signed)
PCCM:  Please see interval H&P dated 08/10/2020 for complete history.  Post-op PTX following ENB  Needle aspiration of PTX was successful.  Repeat CXR at 8:00PM  Admitted for Observation.   If stable will consider discharge in [place], DO [facility] 08/10/2020 4:31 PM

## 2020-08-10 NOTE — Op Note (Signed)
Thoracentesis  Procedure Note  Patrick Morris  [id number]  1951-03-10  Date:08/10/20  Time:4:22 PM   Provider Performing:Erynne Kealey L Jaylene Schrom   Procedure: Thoracentesis with imaging guidance (24825)  Indication(s) Right pneumothorax  Consent Risks of the procedure as well as the alternatives and risks of each were explained to the patient and/or caregiver.  Consent for the procedure was obtained and is signed in the bedside chart  Anesthesia Topical only with 1% lidocaine   Time Out Verified patient identification, verified procedure, site/side was marked, verified correct patient position, special equipment/implants available, medications/allergies/relevant history reviewed, required imaging and test results available.  Sterile Technique Maximal sterile technique including full sterile barrier drape, hand hygiene, sterile gown, sterile gloves, mask, hair covering, sterile ultrasound probe cover (if used).  Procedure Description Ultrasound was used to identify appropriate pleural anatomy for placement and overlying skin marked.  Area of drainage cleaned and draped in sterile fashion. Lidocaine was used to anesthetize the skin and subcutaneous tissue.  >1L of air was drained from the right pleural space. Catheter then removed and bandaid applied to site.  Complications/Tolerance None; patient tolerated the procedure well. Chest X-ray is ordered to confirm no post-procedural complication.  EBL Minimal  Specimen(s) None  >1L of aspirated air   Garner Nash, DO [facility] 08/10/2020 4:24 PM     US Imaging of Chest:  Visible sliding lung s/p air evacuation

## 2020-08-10 NOTE — Progress Notes (Signed)
Dr. Valeta Harms at bedside following repeat chest Xray. Will do a needle aspiration at bedside.

## 2020-08-11 ENCOUNTER — Telehealth: Payer: Self-pay | Admitting: Pulmonary Disease

## 2020-08-11 ENCOUNTER — Other Ambulatory Visit (HOSPITAL_COMMUNITY): Payer: Medicare Other

## 2020-08-11 ENCOUNTER — Observation Stay (HOSPITAL_COMMUNITY): Payer: Medicare Other

## 2020-08-11 ENCOUNTER — Encounter (HOSPITAL_COMMUNITY): Payer: Self-pay | Admitting: Pulmonary Disease

## 2020-08-11 DIAGNOSIS — J939 Pneumothorax, unspecified: Secondary | ICD-10-CM

## 2020-08-11 DIAGNOSIS — R911 Solitary pulmonary nodule: Secondary | ICD-10-CM | POA: Diagnosis not present

## 2020-08-11 LAB — CULTURE, BAL-QUANTITATIVE W GRAM STAIN: Culture: 50000 — AB

## 2020-08-11 LAB — ACID FAST SMEAR (AFB, MYCOBACTERIA)
Acid Fast Smear: NEGATIVE
Acid Fast Smear: NEGATIVE

## 2020-08-11 MED ORDER — POTASSIUM CHLORIDE 20 MEQ PO PACK
40.0000 meq | PACK | Freq: Once | ORAL | Status: AC
  Administered 2020-08-11: 40 meq via ORAL
  Filled 2020-08-11: qty 2

## 2020-08-11 NOTE — Telephone Encounter (Signed)
Spoke with the pt's spouse, ok per DPR. She is aware of appt date/time. Appt scheduled and I have ordered cxr.

## 2020-08-11 NOTE — Discharge Summary (Addendum)
Physician Discharge Summary         Patient ID: KHADEN GATER MRN: [id number] DOB/AGE: 1951-04-28 70 y.o.  Admit date: 08/10/2020 Discharge date: 08/11/2020  Discharge Diagnoses:    RML nodule Iatrogenic right pneumothorax  Tobacco abuse COPD GERD BPH  Discharge summary     This is a 70 year old gentleman history of COPD, GERD, BPH, CAD, atrial flutter, not currently on Eliquis given prior rectal bleeding. Current smoker 0.5 ppd. Smoked for 55+ years. Seen today for evaluation after lung cancer screening CT. patient is unfortunately still smoking.  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  [place]  Patient did enroll this past month for his first lung cancer screening CT.  He does know about his renal lesion that they attempted to freeze before.  They were unable to successfully get into that space.  He has follow-up planned for this.  From a respiratory standpoint he does have shortness of breath with exertion and cough.  He does use Advair daily.  He does have associated centrilobular emphysema.  Previous diagnosis of COPD.  Currently managed with Advair discus.  He had an abnormal lung cancer screening CT with a dominant 11.5 mm right middle lobe lung nodule.  He also has some areas of scattered tree-in-bud with the possibility of underlying MAI.  Underwent bronchoscopy with endobronchial navigation, tissue biopsy and fiducial placement on 08/12/2020 by Dr. Valeta Harms complicated by right PTX s/p aspiration.  CXR x 2 stable since aspiration. Hgb noted stable this admit, at 13.9.   He is currently asymptomatic and hemodynamically stable, ready for discharge home.     Discharge Plan by Active Problems    Right middle lobe lung nodule, 11.5 mm.  He also has some areas of scattered tree-in-bud with the possibility of underlying MAI s/p bronchoscopy with endobronchial navigation, tissue biopsy and fiducial placement on 7/78/2423 complicated by  right PTX s/p aspiration  Plan: Remains asymptomatic and hemodynamically stable CXR x 2 stable post right aspiration for PTX RA sats 95% Will need f/u outpt PET scan imaging and possible referral to thoracic surgery pending cytology results.  AFB neg Follow cytology, fungal, BAL cx Patient educated on signs of recurrent PTX and to call 911 if any changes Pulmonary outpatient f/u scheduled for 4/18 with Eric Form, NP at 1600.  Patient has CXR scheduled prior to appointment.     Tobacco abuse - ongoing tobacco cessation counseling   COPD - continue advair   GERD - does not appear on home meds   Hx atrial flutter CAD - patient took himself off Eliquis given prior rectal bleeding.  Hgb stable 13.9 this admit.  [facility] referral made by Dr. Valeta Harms on 4/12.     - resume home lopressor and cardura   BPH - resume home [facility] tests/ studies   Procedures   4/12 bronchoscopy with endobronchial navigation, tissue biopsy and fiducial placement 4/12 aspiration of right PTX   Culture data/antimicrobials   4/12 BAL >> 4/12 RML cytology>> 4/12 BAL AFB >>neg 4/12 BAL fungal >>   Consults  none    Discharge Exam: BP 117/70 (BP Location: Left Arm)   Pulse 70   Temp 98.2 F (36.8 C) (Oral)   Resp 20   Ht 5' 5.98" (1.676 m)   Wt 82.1 kg   SpO2 95%   BMI 29.23 kg/m   General:  Older male  lying in bed in NAD, wife at bedside HEENT: MM pink/moist Neuro: AOx 3 CV: rr, +2 dp PULM:  Non labored, diminished/ clear throughout GI: soft, bs +, ND Extremities: warm/dry, no edema  Skin: no rashes   Labs at discharge   Lab Results  Component Value Date   CREATININE 0.61 08/10/2020   BUN 15 08/10/2020   NA 139 08/10/2020   K 3.5 08/10/2020   CL 107 08/10/2020   CO2 23 08/10/2020   Lab Results  Component Value Date   WBC 6.4 08/10/2020   HGB 13.9 08/10/2020   HCT 40.8 08/10/2020   MCV 90.1 08/10/2020   PLT 294 08/10/2020   Lab Results   Component Value Date   ALT 20 07/01/2020   AST 13 (L) 07/01/2020   ALKPHOS 63 07/01/2020   BILITOT 0.6 07/01/2020   Lab Results  Component Value Date   INR 1.07 02/01/2012    Current radiological studies    Portable chest 1 View  Result Date: 08/11/2020 CLINICAL DATA:  Pneumothorax EXAM: PORTABLE CHEST 1 VIEW COMPARISON:  08/10/2020 FINDINGS: Tiny right apical pneumothorax is likely stable when accounting for changes in patient positioning. Fiducial marker again noted at the right lung base adjacent to a focal area of parenchymal scar. No pneumothorax on the left. No pleural effusion. Mild cardiomegaly is stable. Pulmonary vascularity is normal. IMPRESSION: Stable tiny right apical pneumothorax. Electronically Signed   By: Fidela Salisbury MD   On: 08/11/2020 05:05   DG CHEST PORT 1 VIEW  Result Date: 08/10/2020 CLINICAL DATA:  Follow-up right pneumothorax. EXAM: PORTABLE CHEST 1 VIEW COMPARISON:  Earlier today at 11:10 a.m. FINDINGS: Slight worsening of right pneumothorax from earlier today. No mediastinal shift. Improving right basilar atelectasis. Fiducial marker noted at the right lung base. Prominent heart size with aortic atherosclerosis. Bronchial thickening throughout the left lung. IMPRESSION: Slight worsening of right pneumothorax from earlier today. No mediastinal shift. This has been subsequently aspirated. Electronically Signed   By: Keith Rake M.D.   On: 08/10/2020 20:12   DG CHEST PORT 1 VIEW  Result Date: 08/10/2020 CLINICAL DATA:  Status post bronchoscopy with biopsy. Follow-up pneumothorax. EXAM: PORTABLE CHEST 1 VIEW COMPARISON:  Earlier today at 9 hour FINDINGS: Trace right apical pneumothorax is unchanged or mildly improved from earlier today. No progression. Fiducial marker in the right mid lung at site of nodule, nodule not well seen. Heart is prominent size. Unchanged mediastinal contours with aortic atherosclerosis. Mild diffuse bronchial thickening.  IMPRESSION: 1. Trace right apical pneumothorax is unchanged or mildly improved from earlier today. No progression. 2. Fiducial marker in the right mid lung at site of nodule, nodule not well seen. Electronically Signed   By: Keith Rake M.D.   On: 08/10/2020 20:10   DG CHEST PORT 1 VIEW  Result Date: 08/10/2020 CLINICAL DATA:  Status post needle aspiration right pneumothorax EXAM: PORTABLE CHEST 1 VIEW COMPARISON:  08/10/2020 at 3:09 p.m. and 11:10 a.m. FINDINGS: Single frontal view of the chest demonstrates near complete resolution of the right pneumothorax after needle decompression. Trace residual right apical pneumothorax identified, with pleural separation measuring approximately 4 mm at the right apex. Fiduciary marker again noted at the right lung base. IMPRESSION: 1. Near complete resolution of the right pneumothorax after needle decompression, with trace residual right apical pneumothorax identified. Volume estimated far less than 5%. 2. Fiduciary marker right middle lobe consistent with known lung nodule. Electronically Signed   By: Randa Ngo M.D.   On:  08/10/2020 16:23   DG CHEST PORT 1 VIEW  Result Date: 08/10/2020 CLINICAL DATA:  70 year old male with right pneumothorax status post bronchoscopy. EXAM: PORTABLE CHEST 1 VIEW COMPARISON:  Portable chest 0934 hours today and earlier. FINDINGS: Portable AP upright view at 1110 hours. Moderate size right pneumothorax has not significantly changed. Pleural edge remains visible in both the upper lobe chest, and at the costophrenic angles. Increasing right lower lung atelectasis. Stable cardiac size and mediastinal contours. No mediastinal shift. Stable left lung ventilation. Stable visualized osseous structures. IMPRESSION: Right pneumothorax not significantly changed since 0934 hours, moderate. No associated mediastinal shift. Increasing right lower lung atelectasis. Electronically Signed   By: Genevie Ann M.D.   On: 08/10/2020 11:36   DG CHEST  PORT 1 VIEW  Result Date: 08/10/2020 CLINICAL DATA:  Post bronchoscopy EXAM: PORTABLE CHEST 1 VIEW COMPARISON:  06/22/2020 FINDINGS: Patchy bilateral airspace disease, increasing since prior study, likely related to bronchoscopy. There is a moderate-sized right pneumothorax. No evidence of tension. Size of the pneumothorax approximately 20%. Heart is normal size. No acute bony abnormality. IMPRESSION: Approximately 20% right pneumothorax.  No evidence of tension. Patchy bilateral airspace disease, increasing since prior study, likely related to bronchoscopy. These results will be called to the ordering clinician or representative by the Radiologist Assistant, and communication documented in the PACS or [employer]. Electronically Signed   By: Rolm Baptise M.D.   On: 08/10/2020 10:10   DG C-ARM BRONCHOSCOPY  Result Date: 08/10/2020 C-ARM BRONCHOSCOPY: Fluoroscopy was utilized by the requesting physician.  No radiographic interpretation.    Disposition:    Discharge disposition: 01-Home or Self Care       Discharge Instructions    Ambulatory referral to Gastroenterology   Complete by: As directed    Rectal bleeding following start of [place]   Call MD for:  persistant dizziness or light-headedness   Complete by: As directed    Call MD for:  persistant nausea and vomiting   Complete by: As directed    Call MD for:  temperature >100.4   Complete by: As directed    Diet - low sodium heart healthy   Complete by: As directed    Discharge patient   Complete by: As directed    Discharge disposition: 01-Home or Self Care   Discharge patient date: 08/10/2020   Increase activity slowly   Complete by: As directed       Allergies as of 08/11/2020      Reactions   Aspirin Other (See Comments)   Acid reflux and drooling      Medication List    STOP taking these medications   apixaban 5 MG Tabs tablet Commonly known as: Eliquis     TAKE these medications   acetaminophen 500 MG  tablet Commonly known as: TYLENOL Take 1,000 mg by mouth every 6 (six) hours as needed for mild pain or moderate pain. For pain   Advair Diskus 250-50 MCG/DOSE Aepb Generic drug: Fluticasone-Salmeterol Inhale 1 puff into the lungs in the morning and at bedtime.   busPIRone 15 MG tablet Commonly known as: BUSPAR Take 1 tablet (15 mg total) by mouth 2 (two) times daily.   doxazosin 4 MG tablet Commonly known as: CARDURA Take 4 mg by mouth at bedtime.   finasteride 5 MG tablet Commonly known as: PROSCAR Take 5 mg by mouth at bedtime.   fluticasone 50 MCG/ACT nasal spray Commonly known as: FLONASE Use 2 spray(s) in each nostril once daily What changed:  See the new instructions.   metoprolol tartrate 25 MG tablet Commonly known as: LOPRESSOR Take 1 tablet (25 mg total) by mouth 2 (two) times daily.        Follow-up appointment   You have an appointment with Eric Form, NP on 08/16/2020 at 1600.  Please arrive 30 mins early to complete your chest Xray and registration.     Discharge Condition:    stable   Time spent on discharge 38 mins.    Kennieth Rad, ACNP [facility] 08/11/2020, 9:57 AM    PCCM:   70 yo M, lung nodules, s/p ENB bronchoscopy complicated by pneumothorax s/p successful aspiration and admission for observation.   CXR this AM stable. Images reviewed. Minimal residual ptx  The patient's images have been independently reviewed by me.    BP 117/70 (BP Location: Left Arm)   Pulse 70   Temp 98.2 F (36.8 C) (Oral)   Resp 20   Ht 5' 5.98" (1.676 m)   Wt 82.1 kg   SpO2 95%   BMI 29.23 kg/m   Gen: elderly male, comfortable in bed Heart: RRR s1 s2 Lungs: CTAB no wheeze  A:  Lung nodule s/p navigational bronchoscopy  Post-procedural pneumothorax s/p aspiration  P: CXR stable DC home today  Follow up in clinic on Monday with repeat CXR Patient instructed to return to ED if SOB or CP returns.   36 mins  Garner Nash, DO [facility] 08/11/2020 10:44 AM

## 2020-08-11 NOTE — Addendum Note (Signed)
Addended by: Rosana Berger on: 08/11/2020 09:30 AM   Modules accepted: Orders

## 2020-08-11 NOTE — Progress Notes (Signed)
eLink Physician-Brief Progress Note Patient Name: Patrick Morris DOB: November 01, 1950 MRN: [id number]   Date of Service  08/11/2020  HPI/Events of Note  Hypokalemia - K+ = 3.5 and Creatinine = 0.61.  eICU Interventions  Will replace K+.     Intervention Category Major Interventions: Electrolyte abnormality - evaluation and management  Jennet Scroggin [place] 08/11/2020, 6:40 AM

## 2020-08-11 NOTE — Anesthesia Postprocedure Evaluation (Signed)
Anesthesia Post Note  Patient: Julyan R Agyeman  Procedure(s) Performed: VIDEO BRONCHOSCOPY WITH ENDOBRONCHIAL NAVIGATION (Right ) BRONCHIAL BIOPSIES BRONCHIAL BRUSHINGS BRONCHIAL NEEDLE ASPIRATION BIOPSIES FIDUCIAL MARKER PLACEMENT ENDOBRONCHIAL ULTRASOUND BRONCHIAL WASHINGS     Patient location during evaluation: PACU Anesthesia Type: General Level of consciousness: awake and alert Pain management: pain level controlled Vital Signs Assessment: post-procedure vital signs reviewed and stable Respiratory status: spontaneous breathing, nonlabored ventilation, respiratory function stable and patient connected to nasal cannula oxygen Cardiovascular status: blood pressure returned to baseline and stable Postop Assessment: no apparent nausea or vomiting Anesthetic complications: no   No complications documented.  Last Vitals:  Vitals:   08/11/20 0230 08/11/20 0509  BP: 121/74 112/70  Pulse: 63 64  Resp: (!) 21   Temp: 37.4 C 37.1 C  SpO2: 100% 97%    Last Pain:  Vitals:   08/11/20 0509  TempSrc: Oral  PainSc:                  Ashleigh Arya L [place]

## 2020-08-11 NOTE — Telephone Encounter (Signed)
PCCM:  Please set up patient in 4PM slot with Eric Form, NP at [facility] on Monday 18th.   I have spoke to [name] and she is aware.  I have ordered a CXR for patient to have prior to office appt. Please instruct patient to come early for CXR.    Garner Nash, DO [facility] 08/11/2020 9:05 AM

## 2020-08-12 ENCOUNTER — Ambulatory Visit: Payer: Medicare Other

## 2020-08-12 ENCOUNTER — Other Ambulatory Visit: Payer: Self-pay

## 2020-08-12 ENCOUNTER — Encounter: Payer: Self-pay | Admitting: Internal Medicine

## 2020-08-12 ENCOUNTER — Telehealth: Payer: Self-pay | Admitting: Pulmonary Disease

## 2020-08-12 ENCOUNTER — Ambulatory Visit (INDEPENDENT_AMBULATORY_CARE_PROVIDER_SITE_OTHER): Payer: Medicare Other

## 2020-08-12 ENCOUNTER — Ambulatory Visit (INDEPENDENT_AMBULATORY_CARE_PROVIDER_SITE_OTHER): Payer: Medicare Other | Admitting: Internal Medicine

## 2020-08-12 VITALS — BP 122/68 | HR 63 | Temp 98.1°F | Ht 66.0 in | Wt 177.4 lb

## 2020-08-12 DIAGNOSIS — R0781 Pleurodynia: Secondary | ICD-10-CM | POA: Diagnosis not present

## 2020-08-12 DIAGNOSIS — J939 Pneumothorax, unspecified: Secondary | ICD-10-CM

## 2020-08-12 NOTE — Progress Notes (Signed)
OV 08/12/2020  Subjective:  Patient ID: Patrick Morris, male , DOB: Dec 19, 1950 , age 70 y.o. , MRN: [id number] , ADDRESS: [place] [id number] PCP Saguier, Percell Miller, PA-C Patient Care Team: Saguier, Iris Pert as PCP - General (Physician Assistant)  This Provider for this visit: Treatment Team:  Attending Provider: Brand Males, MD    08/12/2020 -   Chief Complaint  Patient presents with  . Acute Visit    Soreness from bronch yesterday, no SOB     HPI Patrick Morris 70 y.o. -patient is in for an acute visit arranged with Dr. June Leap.  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  He underwent right-sided bronchoscopy biopsy on 08/10/2020 per history and review of the records.  He sustained a post biopsy pneumothorax and per Dr. June Leap and review of the records air was aspirated with near total resolution.  I personally visualized these chest x-rays.  He had a follow-up chest x-ray yesterday 08/11/2020 and a tiny residual right apical pneumothorax was present.  He called in today because of soreness in his right chest and was asked to make this acute visit.  We did another follow-up chest x-ray today and according to my personal visualization and the official report there is total resolution of the pneumothorax.  He denies any symptoms of COPD exacerbation.  He denies any cough or excessive straining.  There is no fever or chills or sputum production.  There is no orthopnea proximal nocturnal dyspnea.  His only main complaint is that after the procedure he had right infra axillary pain that is still persistent.  He says initially it was level 5 out of 10 but now it is improved automatically to level 2 out of 10.  He initially asked for some medication for this but we discussed this and given the fact that this only level 2 and it is improving we agreed and took a shared decision making to do an expectant approach with nonpharmaceutical  management of this pain.    CT Chest data  DG Chest 2 View  Result Date: 08/12/2020 CLINICAL DATA:  Right pneumothorax EXAM: CHEST - 2 VIEW COMPARISON:  08/11/2020 FINDINGS: Enlargement of cardiac silhouette with pulmonary vascular congestion. Atherosclerotic calcification aorta. Patchy biapical infiltrates and diffuse accentuation of interstitial markings. No additional infiltrate, pleural effusion or pneumothorax. Tiny RIGHT apex pneumothorax seen on previous exam no longer identified. Osseous structures unremarkable. IMPRESSION: Resolution of previously identified tiny RIGHT apical pneumothorax. Mild biapical infiltrates. Electronically Signed   By: Lavonia Dana M.D.   On: 08/12/2020 10:47   Portable chest 1 View  Result Date: 08/11/2020 CLINICAL DATA:  Pneumothorax EXAM: PORTABLE CHEST 1 VIEW COMPARISON:  08/10/2020 FINDINGS: Tiny right apical pneumothorax is likely stable when accounting for changes in patient positioning. Fiducial marker again noted at the right lung base adjacent to a focal area of parenchymal scar. No pneumothorax on the left. No pleural effusion. Mild cardiomegaly is stable. Pulmonary vascularity is normal. IMPRESSION: Stable tiny right apical pneumothorax. Electronically Signed   By: Fidela Salisbury MD   On: 08/11/2020 05:05   DG CHEST PORT 1 VIEW  Result Date: 08/10/2020 CLINICAL DATA:  Follow-up right pneumothorax. EXAM: PORTABLE CHEST 1 VIEW COMPARISON:  Earlier today at 11:10 a.m. FINDINGS: Slight worsening of right pneumothorax from earlier today. No mediastinal shift. Improving right basilar atelectasis. Fiducial marker noted at the right lung base. Prominent heart size with aortic atherosclerosis.  Bronchial thickening throughout the left lung. IMPRESSION: Slight worsening of right pneumothorax from earlier today. No mediastinal shift. This has been subsequently aspirated. Electronically Signed   By: Keith Rake M.D.   On: 08/10/2020 20:12   DG CHEST PORT 1  VIEW  Result Date: 08/10/2020 CLINICAL DATA:  Status post bronchoscopy with biopsy. Follow-up pneumothorax. EXAM: PORTABLE CHEST 1 VIEW COMPARISON:  Earlier today at 74 hour FINDINGS: Trace right apical pneumothorax is unchanged or mildly improved from earlier today. No progression. Fiducial marker in the right mid lung at site of nodule, nodule not well seen. Heart is prominent size. Unchanged mediastinal contours with aortic atherosclerosis. Mild diffuse bronchial thickening. IMPRESSION: 1. Trace right apical pneumothorax is unchanged or mildly improved from earlier today. No progression. 2. Fiducial marker in the right mid lung at site of nodule, nodule not well seen. Electronically Signed   By: Keith Rake M.D.   On: 08/10/2020 20:10   DG CHEST PORT 1 VIEW  Result Date: 08/10/2020 CLINICAL DATA:  Status post needle aspiration right pneumothorax EXAM: PORTABLE CHEST 1 VIEW COMPARISON:  08/10/2020 at 3:09 p.m. and 11:10 a.m. FINDINGS: Single frontal view of the chest demonstrates near complete resolution of the right pneumothorax after needle decompression. Trace residual right apical pneumothorax identified, with pleural separation measuring approximately 4 mm at the right apex. Fiduciary marker again noted at the right lung base. IMPRESSION: 1. Near complete resolution of the right pneumothorax after needle decompression, with trace residual right apical pneumothorax identified. Volume estimated far less than 5%. 2. Fiduciary marker right middle lobe consistent with known lung nodule. Electronically Signed   By: Randa Ngo M.D.   On: 08/10/2020 16:23   DG CHEST PORT 1 VIEW  Result Date: 08/10/2020 CLINICAL DATA:  70 year old male with right pneumothorax status post bronchoscopy. EXAM: PORTABLE CHEST 1 VIEW COMPARISON:  Portable chest 0934 hours today and earlier. FINDINGS: Portable AP upright view at 1110 hours. Moderate size right pneumothorax has not significantly changed. Pleural edge  remains visible in both the upper lobe chest, and at the costophrenic angles. Increasing right lower lung atelectasis. Stable cardiac size and mediastinal contours. No mediastinal shift. Stable left lung ventilation. Stable visualized osseous structures. IMPRESSION: Right pneumothorax not significantly changed since 0934 hours, moderate. No associated mediastinal shift. Increasing right lower lung atelectasis. Electronically Signed   By: Genevie Ann M.D.   On: 08/10/2020 11:36   DG CHEST PORT 1 VIEW  Result Date: 08/10/2020 CLINICAL DATA:  Post bronchoscopy EXAM: PORTABLE CHEST 1 VIEW COMPARISON:  06/22/2020 FINDINGS: Patchy bilateral airspace disease, increasing since prior study, likely related to bronchoscopy. There is a moderate-sized right pneumothorax. No evidence of tension. Size of the pneumothorax approximately 20%. Heart is normal size. No acute bony abnormality. IMPRESSION: Approximately 20% right pneumothorax.  No evidence of tension. Patchy bilateral airspace disease, increasing since prior study, likely related to bronchoscopy. These results will be called to the ordering clinician or representative by the Radiologist Assistant, and communication documented in the PACS or [employer]. Electronically Signed   By: Rolm Baptise M.D.   On: 08/10/2020 10:10   DG C-ARM BRONCHOSCOPY  Result Date: 08/10/2020 C-ARM BRONCHOSCOPY: Fluoroscopy was utilized by the requesting physician.  No radiographic interpretation.      PFT  No flowsheet data found.     has a past medical history of Atypical atrial flutter ([place]), BPH (benign prostatic hyperplasia), Cataract, Chronic anticoagulation, Colitis, COPD (chronic obstructive pulmonary disease) ([place]), COPD exacerbation ([place]) (01/22/2014), Coronary artery  calcification, Ear infection, GERD (gastroesophageal reflux disease), Hard of hearing, Salmonella, and Sinus infection.   reports that he has been smoking. He has a 55.00 pack-year smoking history.  He has never used smokeless tobacco.  Past Surgical History:  Procedure Laterality Date  . BRONCHIAL BIOPSY  08/10/2020   Procedure: BRONCHIAL BIOPSIES;  Surgeon: Garner Nash, DO;  Location: [facility];  Service: Pulmonary;;  . BRONCHIAL BRUSHINGS  08/10/2020   Procedure: BRONCHIAL BRUSHINGS;  Surgeon: Garner Nash, DO;  Location: [facility];  Service: Pulmonary;;  . BRONCHIAL NEEDLE ASPIRATION BIOPSY  08/10/2020   Procedure: BRONCHIAL NEEDLE ASPIRATION BIOPSIES;  Surgeon: Garner Nash, DO;  Location: [facility];  Service: Pulmonary;;  . BRONCHIAL WASHINGS  08/10/2020   Procedure: BRONCHIAL WASHINGS;  Surgeon: Garner Nash, DO;  Location: [facility];  Service: Pulmonary;;  . COLONOSCOPY    . ENDOBRONCHIAL ULTRASOUND  08/10/2020   Procedure: ENDOBRONCHIAL ULTRASOUND;  Surgeon: Garner Nash, DO;  Location: [facility];  Service: Pulmonary;;  . EYE SURGERY Right   . FIDUCIAL MARKER PLACEMENT  08/10/2020   Procedure: FIDUCIAL MARKER PLACEMENT;  Surgeon: Garner Nash, DO;  Location: [facility];  Service: Pulmonary;;  . IR RADIOLOGIST EVAL & MGMT  05/07/2019  . IR RADIOLOGIST EVAL & MGMT  05/27/2019  . IR RADIOLOGIST EVAL & MGMT  10/23/2019  . IR RADIOLOGIST EVAL & MGMT  01/15/2020  . VIDEO BRONCHOSCOPY WITH ENDOBRONCHIAL NAVIGATION Right 08/10/2020   Procedure: VIDEO BRONCHOSCOPY WITH ENDOBRONCHIAL NAVIGATION;  Surgeon: Garner Nash, DO;  Location: [facility];  Service: Pulmonary;  Laterality: Right;    Allergies  Allergen Reactions  . Aspirin Other (See Comments)    Acid reflux and drooling    Immunization History  Administered Date(s) Administered  . Influenza Split 12/30/2013, 01/22/2014  . Influenza, High Dose Seasonal PF 02/26/2017, 03/19/2018  . Influenza,inj,Quad PF,6+ Mos 01/22/2014  . Influenza-Unspecified 12/30/2013  . Moderna Sars-Covid-2 Vaccination 12/03/2019, 12/31/2019  . Pneumococcal Conjugate-13 01/25/2014  . Pneumococcal  Polysaccharide-23 02/03/2014, 02/26/2017    Family History  Problem Relation Age of Onset  . Hyperlipidemia Father   . Stroke Father   . Alcohol abuse Father   . Throat cancer Brother   . Lymphoma Brother      Current Outpatient Medications:  .  acetaminophen (TYLENOL) 500 MG tablet, Take 1,000 mg by mouth every 6 (six) hours as needed for mild pain or moderate pain. For pain, Disp: , Rfl:  .  ADVAIR DISKUS 250-50 MCG/DOSE AEPB, Inhale 1 puff into the lungs in the morning and at bedtime., Disp: 180 each, Rfl: 1 .  busPIRone (BUSPAR) 15 MG tablet, Take 1 tablet (15 mg total) by mouth 2 (two) times daily., Disp: 60 tablet, Rfl: 0 .  doxazosin (CARDURA) 4 MG tablet, Take 4 mg by mouth at bedtime., Disp: , Rfl:  .  finasteride (PROSCAR) 5 MG tablet, Take 5 mg by mouth at bedtime., Disp: , Rfl:  .  fluticasone (FLONASE) 50 MCG/ACT nasal spray, Use 2 spray(s) in each nostril once daily (Patient taking differently: Place 2 sprays into both nostrils daily as needed for allergies or rhinitis.), Disp: 16 g, Rfl: 1 .  metoprolol tartrate (LOPRESSOR) 25 MG tablet, Take 1 tablet (25 mg total) by mouth 2 (two) times daily., Disp: 180 tablet, Rfl: 3      Objective:   Vitals:   08/12/20 1043  BP: 122/68  Pulse: 63  Temp: 98.1 F (36.7 C)  TempSrc: Oral  SpO2: 98%  Weight: 177 lb 6.4 oz (80.5 kg)  Height: 5\' 6"  (1.676 m)    Estimated body mass index is 28.63 kg/m as calculated from the following:   Height as of this encounter: 5\' 6"  (1.676 m).   Weight as of this encounter: 177 lb 6.4 oz (80.5 kg).  @WEIGHTCHANGE @  [employer]   08/12/20 1043  Weight: 177 lb 6.4 oz (80.5 kg)     Physical Exam General: No distress. Looks well Neuro: Alert and Oriented x 3. GCS 15. Speech normal Psych: Pleasant Resp:  Barrel Chest - no.  Wheeze - no, Crackles - no, No overt respiratory distress CVS: Normal heart sounds. Murmurs - no Ext: Stigmata of Connective Tissue Disease - no HEENT: Normal  upper airway. PEERL +. No post nasal drip        Assessment:       ICD-10-CM   1. Pneumothorax, right  J93.9   2. Chest pain, pleuritic  R07.81        Plan:     Patient Instructions     ICD-10-CM   1. Pneumothorax, right  J93.9   2. Chest pain, pleuritic  R07.81    Lung collapse post lung biopsy/12/22 has now resolved as of 08/12/2020  The right-sided chest pain is what is called pleurisy pain because of the lung expansion.   - It is now significantly improved and is a scale of 2 over 10  Clinically there is no evidence of COPD exacerbation  Biopsy report not back yet  Plan  -Continue your baseline inhaler therapy with Advair and albuterol as needed -Be patient with the pain it is on its way out  -You can apply some warm compress or cold pads depending on which gives you relief -Avoid excessive straining or weight lifting or coughing  Follow-up -Per Dr. June Leap as previously scheduled for bibiopsy result     SIGNATURE    Dr. Brand Males, M.D., F.C.C.P,  Pulmonary and Critical Care Medicine Staff Physician, [place] Director - Interstitial Lung Disease  Program  Pulmonary [place] at [place], [place], [address]  Pager: [phone], If no answer or between  15:00h - 7:00h: call [phone] Telephone: [phone]  11:06 AM 08/12/2020

## 2020-08-12 NOTE — Telephone Encounter (Signed)
STAT CXR ordered  Pt's spouse made aware and they are headed here now to get this done  Will forward back to Dr Valeta Harms as reminder to keep an eye out for results  Thanks!

## 2020-08-12 NOTE — Telephone Encounter (Signed)
Spoke with the pt's spouse, Hassan Rowan, [place] per DPR  She states last night pt started having severe pain in his right breastbone area, wraps around to his back  Pain gets worse if he coughs, but not worse with inspiration  No hemoptysis, fever body aches, increased SOB  He has ov with [facility] with stat cxr on 08/16/20  What can he do in the meantime  At this moment, MR has a couple of openings this am  Please advise thanks!

## 2020-08-12 NOTE — Telephone Encounter (Signed)
Received msg via secure chat that Dr Valeta Harms wanted this pt to also see provider  Appt with MR for 10:30 am  Will change cxr order to MR

## 2020-08-12 NOTE — Telephone Encounter (Signed)
Please bring patient to office for STAT CXR. Or have them come to [facility] ED.   BLI

## 2020-08-12 NOTE — Patient Instructions (Addendum)
ICD-10-CM   1. Pneumothorax, right  J93.9   2. Chest pain, pleuritic  R07.81    Lung collapse post lung biopsy/12/22 has now resolved as of 08/12/2020  The right-sided chest pain is what is called pleurisy pain because of the lung expansion.   - It is now significantly improved and is a scale of 2 over 10  Clinically there is no evidence of COPD exacerbation  Biopsy report not back yet  Plan  -Continue your baseline inhaler therapy with Advair and albuterol as needed -Be patient with the pain it is on its way out  -You can apply some warm compress or cold pads depending on which gives you relief -Avoid excessive straining or weight lifting or coughing  Follow-up -Per Dr. Leory Plowman Icard as previously scheduled for bibiopsy result

## 2020-08-13 LAB — CYTOLOGY - NON PAP

## 2020-08-15 LAB — ANAEROBIC CULTURE W GRAM STAIN

## 2020-08-16 ENCOUNTER — Other Ambulatory Visit: Payer: Self-pay

## 2020-08-16 ENCOUNTER — Ambulatory Visit (INDEPENDENT_AMBULATORY_CARE_PROVIDER_SITE_OTHER): Payer: Medicare Other | Admitting: Acute Care

## 2020-08-16 ENCOUNTER — Encounter: Payer: Self-pay | Admitting: Acute Care

## 2020-08-16 VITALS — BP 112/72 | HR 62 | Temp 97.7°F | Ht 66.0 in | Wt 181.2 lb

## 2020-08-16 DIAGNOSIS — J95811 Postprocedural pneumothorax: Secondary | ICD-10-CM

## 2020-08-16 DIAGNOSIS — C3491 Malignant neoplasm of unspecified part of right bronchus or lung: Secondary | ICD-10-CM

## 2020-08-16 DIAGNOSIS — F17210 Nicotine dependence, cigarettes, uncomplicated: Secondary | ICD-10-CM

## 2020-08-16 DIAGNOSIS — Z720 Tobacco use: Secondary | ICD-10-CM

## 2020-08-16 LAB — CYTOLOGY - NON PAP

## 2020-08-16 LAB — AEROBIC/ANAEROBIC CULTURE W GRAM STAIN (SURGICAL/DEEP WOUND): Culture: NO GROWTH

## 2020-08-16 NOTE — Progress Notes (Signed)
History of Present Illness Patrick Morris is a 70 y.o. male with history of COPD, GERD, BPH, current every day smoker  with a 55 pack year smoking history. Seen by Dr. Valeta Harms after abnormal Lung Cancer Screening LDCT  right-sided bronchoscopy biopsy on 08/10/2020    08/16/2020  Pt. Presents for follow up after ENB. Pt. Had ENB 08/10/2020 for the purpose of tissue typing, after abnormal LDCT. He had a post procedure pneumothorax. This was treated with aspiration of air by Dr. Valeta Harms 08/10/2020. Pt. Stayed in the hospital overnight , and was discharged home 4/13. He was seen for an acute visit 08/12/2020  for generalized pain by Dr. Chase Caller . CXR showed  Resolution of pneumothorax, but also mild bi apical infiltrates. He was evaluated and sent home with follow up today to review  path results .  Pt. States he did well over the weekend. No further concerns after his bronch. He is coughing up clear secretions, no hemoptysis.No fever or chest pain.  He has not smoked since his procedure. We discussed that his pathology came back suspicious for adenocarcinoma. We discussed that this is a slow growing lung cancer. He had already discussed with Dr. Valeta Harms that he would not want surgery, but was open to radiation. We discussed referral to radiation oncology in [facility], which is where he lives for convenience. We have placed the referral for treatment. I explained that they will get a call to schedule this.   We discussed smoking cessation , and patient states he has cut down but has not totally quit. I offered nicotine replacement therapy which he has declined.     Test Results: CXR 08/12/2020 Resolution of previously identified tiny RIGHT apical pneumothorax. Mild biapical infiltrates.    Pathology from 08/10/2020 ENB  FINAL MICROSCOPIC DIAGNOSIS:   A. LUNG, RML, BRUSHING:  - Suspicious for adenocarcinoma, see comment   B. LUNG, RML, FINE NEEDLE ASPIRATION:  - Atypical cells present   D.  LUNG, RUL, BRUSHING:  - No malignant cells identified   E. LUNG, RUL, FINE NEEDLE ASPIRATION:  - No malignant cells identified   A.  Majority of the cell clusters appear to be benign bronchial  epithelial cells with reactive changes. However, there are rare  clusters with increased cytologic atypia that appears suspicious for  possible well-differentiated adenocarcinoma. Dr. Saralyn Pilar and Dr. Tresa Moore  reviewed the case and concur with the diagnosis.   08/10/2020 CXR  Low Dose CT Chest 07/15/2020 Lung-RADS 4B, suspicious. New 11.5 mm right middle lobe nodule, in somewhat close proximity to a focus of tree-in-bud nodularity. While this may be related to infectious/inflammatory etiology, neoplasm is certainly a concern. Additional imaging evaluation or consultation with Pulmonology or Thoracic Surgery recommended. 2. Interval development of multiple scattered tiny bilateral pulmonary nodules. Indeterminate. Close attention on follow-up recommended. 3. New areas of focal tree-in-bud nodularity in both lungs. Imaging features suggest infectious etiology with atypical infection (including MAI) a distinct consideration. Right 2.3 cm exophytic kidney lesion has increased from upper pole 2.1 cm on the 05/15/2018 exam. This lesion was not visible on the 2013 study. Attention on follow-up recommended. 4. Cholelithiasis. 5.  Emphysema (ICD10-J43.9) and Aortic Atherosclerosis (ICD10-170.0)  08/02/2020 PET Scan hypermetabolic nodule in the RIGHT middle (11 mm) lobe concern for bronchogenic carcinoma.  Smaller nodule RIGHT middle lobe (6 mm) is indeterminate but suspicious.  Hypermetabolic nodular thickening at the RIGHT lung apex is favored benign  Potential hypermetabolic upper RIGHT hilar lymph node.  No distant  metastatic disease.  08/04/2020 Super D CT Chest Redemonstrated, spiculated nodule of the medial segment right middle lobe measuring 1.5 x 1.0 cm. This nodule is previously  FDG PET avid and findings remain consistent with primary lung malignancy. 2. Additional sub solid irregular nodule of the inferior medial segment right middle lobe measuring 0.8 cm, indeterminate and nonspecific, at or below FDG PET resolution. Continued attention on follow-up. 3. Emphysema 4. Diffuse bilateral bronchial wall thickening, consistent with nonspecific infectious or inflammatory bronchitis. 5. Coronary artery disease.   CBC Latest Ref Rng & Units 08/10/2020 07/01/2020 07/05/2018  WBC 4.0 - 10.5 K/uL 6.4 7.9 8.6  Hemoglobin 13.0 - 17.0 g/dL 13.9 12.6(L) 13.7  Hematocrit 39.0 - 52.0 % 40.8 35.9(L) 40.8  Platelets 150 - 400 K/uL 294 251 300.0    BMP Latest Ref Rng & Units 08/10/2020 07/01/2020 03/03/2020  Glucose 70 - 99 mg/dL 100(H) 127(H) 96  BUN 8 - 23 mg/dL 15 18 15   Creatinine 0.61 - 1.24 mg/dL 0.61 0.68 0.73  BUN/Creat Ratio 6 - 22 (calc) - - NOT APPLICABLE  Sodium 315 - 145 mmol/L 139 137 142  Potassium 3.5 - 5.1 mmol/L 3.5 3.1(L) 4.2  Chloride 98 - 111 mmol/L 107 105 108  CO2 22 - 32 mmol/L 23 23 27   Calcium 8.9 - 10.3 mg/dL 8.8(L) 8.2(L) 9.2    BNP No results found for: BNP  ProBNP No results found for: PROBNP  PFT No results found for: FEV1PRE, FEV1POST, FVCPRE, FVCPOST, TLC, DLCOUNC, PREFEV1FVCRT, PSTFEV1FVCRT  DG Chest 2 View  Result Date: 08/12/2020 CLINICAL DATA:  Right pneumothorax EXAM: CHEST - 2 VIEW COMPARISON:  08/11/2020 FINDINGS: Enlargement of cardiac silhouette with pulmonary vascular congestion. Atherosclerotic calcification aorta. Patchy biapical infiltrates and diffuse accentuation of interstitial markings. No additional infiltrate, pleural effusion or pneumothorax. Tiny RIGHT apex pneumothorax seen on previous exam no longer identified. Osseous structures unremarkable. IMPRESSION: Resolution of previously identified tiny RIGHT apical pneumothorax. Mild biapical infiltrates. Electronically Signed   By: Lavonia Dana M.D.   On: 08/12/2020 10:47    NM PET Image Initial (PI) Skull Base To Thigh  Result Date: 08/03/2020 CLINICAL DATA:  Initial treatment strategy for RIGHT lung nodule. EXAM: NUCLEAR MEDICINE PET SKULL BASE TO THIGH TECHNIQUE: mCi F-18 FDG was injected intravenously. Full-ring PET imaging was performed from the skull base to thigh after the radiotracer. CT data was obtained and used for attenuation correction and anatomic localization. Fasting blood glucose:  mg/dl COMPARISON:  None. FINDINGS: Mediastinal blood pool activity: SUV max Liver activity: SUV max NA NECK: Activity within the enlarged RIGHT lobe of thyroid gland is focal SUV max equal 3.7 Incidental CT findings: none CHEST: 10 mm nodule in the RIGHT middle lobe (image 77/series 4) has focal metabolic activity SUV max equal 3.8. Second hypermetabolic nodule in the RIGHT middle lobe measures 6 mm on image 86 with SUV max equal 1.9. Hypermetabolic RIGHT hilar lymph node is poorly defined on noncontrast CT. Focal activity at RIGHT hila with SUV max equal 3.7 Incidental CT findings: None ABDOMEN/PELVIS: No abnormal hypermetabolic activity within the liver, pancreas, adrenal glands, or spleen. No hypermetabolic lymph nodes in the abdomen or pelvis. Incidental CT findings: Large bladder diverticula SKELETON: No focal hypermetabolic activity to suggest skeletal metastasis. Incidental CT findings: none IMPRESSION: hypermetabolic nodule in the RIGHT middle (11 mm) lobe concern for bronchogenic carcinoma. Smaller nodule RIGHT middle lobe (6 mm) is indeterminate but suspicious. Hypermetabolic nodular thickening at the RIGHT lung apex is favored benign Potential hypermetabolic  upper RIGHT hilar lymph node. No distant metastatic disease. Electronically Signed   By: Suzy Bouchard M.D.   On: 08/03/2020 15:25   Portable chest 1 View  Result Date: 08/11/2020 CLINICAL DATA:  Pneumothorax EXAM: PORTABLE CHEST 1 VIEW COMPARISON:  08/10/2020 FINDINGS: Tiny right apical pneumothorax is likely  stable when accounting for changes in patient positioning. Fiducial marker again noted at the right lung base adjacent to a focal area of parenchymal scar. No pneumothorax on the left. No pleural effusion. Mild cardiomegaly is stable. Pulmonary vascularity is normal. IMPRESSION: Stable tiny right apical pneumothorax. Electronically Signed   By: Fidela Salisbury MD   On: 08/11/2020 05:05   DG CHEST PORT 1 VIEW  Result Date: 08/10/2020 CLINICAL DATA:  Follow-up right pneumothorax. EXAM: PORTABLE CHEST 1 VIEW COMPARISON:  Earlier today at 11:10 a.m. FINDINGS: Slight worsening of right pneumothorax from earlier today. No mediastinal shift. Improving right basilar atelectasis. Fiducial marker noted at the right lung base. Prominent heart size with aortic atherosclerosis. Bronchial thickening throughout the left lung. IMPRESSION: Slight worsening of right pneumothorax from earlier today. No mediastinal shift. This has been subsequently aspirated. Electronically Signed   By: Keith Rake M.D.   On: 08/10/2020 20:12   DG CHEST PORT 1 VIEW  Result Date: 08/10/2020 CLINICAL DATA:  Status post bronchoscopy with biopsy. Follow-up pneumothorax. EXAM: PORTABLE CHEST 1 VIEW COMPARISON:  Earlier today at 30 hour FINDINGS: Trace right apical pneumothorax is unchanged or mildly improved from earlier today. No progression. Fiducial marker in the right mid lung at site of nodule, nodule not well seen. Heart is prominent size. Unchanged mediastinal contours with aortic atherosclerosis. Mild diffuse bronchial thickening. IMPRESSION: 1. Trace right apical pneumothorax is unchanged or mildly improved from earlier today. No progression. 2. Fiducial marker in the right mid lung at site of nodule, nodule not well seen. Electronically Signed   By: Keith Rake M.D.   On: 08/10/2020 20:10   DG CHEST PORT 1 VIEW  Result Date: 08/10/2020 CLINICAL DATA:  Status post needle aspiration right pneumothorax EXAM: PORTABLE CHEST 1  VIEW COMPARISON:  08/10/2020 at 3:09 p.m. and 11:10 a.m. FINDINGS: Single frontal view of the chest demonstrates near complete resolution of the right pneumothorax after needle decompression. Trace residual right apical pneumothorax identified, with pleural separation measuring approximately 4 mm at the right apex. Fiduciary marker again noted at the right lung base. IMPRESSION: 1. Near complete resolution of the right pneumothorax after needle decompression, with trace residual right apical pneumothorax identified. Volume estimated far less than 5%. 2. Fiduciary marker right middle lobe consistent with known lung nodule. Electronically Signed   By: Randa Ngo M.D.   On: 08/10/2020 16:23   DG CHEST PORT 1 VIEW  Result Date: 08/10/2020 CLINICAL DATA:  70 year old male with right pneumothorax status post bronchoscopy. EXAM: PORTABLE CHEST 1 VIEW COMPARISON:  Portable chest 0934 hours today and earlier. FINDINGS: Portable AP upright view at 1110 hours. Moderate size right pneumothorax has not significantly changed. Pleural edge remains visible in both the upper lobe chest, and at the costophrenic angles. Increasing right lower lung atelectasis. Stable cardiac size and mediastinal contours. No mediastinal shift. Stable left lung ventilation. Stable visualized osseous structures. IMPRESSION: Right pneumothorax not significantly changed since 0934 hours, moderate. No associated mediastinal shift. Increasing right lower lung atelectasis. Electronically Signed   By: Genevie Ann M.D.   On: 08/10/2020 11:36   DG CHEST PORT 1 VIEW  Result Date: 08/10/2020 CLINICAL DATA:  Post bronchoscopy  EXAM: PORTABLE CHEST 1 VIEW COMPARISON:  06/22/2020 FINDINGS: Patchy bilateral airspace disease, increasing since prior study, likely related to bronchoscopy. There is a moderate-sized right pneumothorax. No evidence of tension. Size of the pneumothorax approximately 20%. Heart is normal size. No acute bony abnormality. IMPRESSION:  Approximately 20% right pneumothorax.  No evidence of tension. Patchy bilateral airspace disease, increasing since prior study, likely related to bronchoscopy. These results will be called to the ordering clinician or representative by the Radiologist Assistant, and communication documented in the PACS or [employer]. Electronically Signed   By: Rolm Baptise M.D.   On: 08/10/2020 10:10   CT Super D Chest Wo Contrast  Result Date: 08/04/2020 CLINICAL DATA:  Follow-up CT lung cancer screening, biopsy planned EXAM: CT CHEST WITHOUT CONTRAST TECHNIQUE: Multidetector CT imaging of the chest was performed using thin slice collimation for electromagnetic bronchoscopy planning purposes, without intravenous contrast. COMPARISON:  PET-CT, 08/02/2020, CT chest, 07/14/2020 FINDINGS: Cardiovascular: Aortic atherosclerosis. Normal heart size. Left coronary artery calcifications. No pericardial effusion. Mediastinum/Nodes: No enlarged mediastinal, hilar, or axillary lymph nodes. Thyroid gland, trachea, and esophagus demonstrate no significant findings. Lungs/Pleura: Redemonstrated, spiculated nodule of the medial segment right middle lobe measuring 1.5 x 1.0 cm (series 7, image 95). Additional sub solid irregular nodule of the inferior medial segment right middle lobe measuring 0.8 cm (series 7, image 112). Bandlike scarring of the left lung base. Biapical pleuroparenchymal scarring. Underlying mild centrilobular and paraseptal emphysema. Diffuse bilateral bronchial wall thickening. No pleural effusion or pneumothorax. Upper Abdomen: No acute abnormality. Musculoskeletal: No chest wall mass or suspicious bone lesions identified. IMPRESSION: 1. Redemonstrated, spiculated nodule of the medial segment right middle lobe measuring 1.5 x 1.0 cm. This nodule is previously FDG PET avid and findings remain consistent with primary lung malignancy. 2. Additional sub solid irregular nodule of the inferior medial segment right middle  lobe measuring 0.8 cm, indeterminate and nonspecific, at or below FDG PET resolution. Continued attention on follow-up. 3. Emphysema 4. Diffuse bilateral bronchial wall thickening, consistent with nonspecific infectious or inflammatory bronchitis. 5. Coronary artery disease. Aortic Atherosclerosis (ICD10-I70.0) and Emphysema (ICD10-J43.9). Electronically Signed   By: Eddie Candle M.D.   On: 08/04/2020 14:05   DG C-ARM BRONCHOSCOPY  Result Date: 08/10/2020 C-ARM BRONCHOSCOPY: Fluoroscopy was utilized by the requesting physician.  No radiographic interpretation.     Past medical hx Past Medical History:  Diagnosis Date  . 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 BPH (benign prostatic hyperplasia)    his specialist is recommending surgery.  . Cataract   . Chronic anticoagulation   . Colitis   . COPD (chronic obstructive pulmonary disease) ([place])   . COPD exacerbation ([place]) 01/22/2014  . Coronary artery calcification   . Ear infection   . GERD (gastroesophageal reflux disease)   . Hard of hearing   . Salmonella   . Sinus infection      Social History   Tobacco Use  . Smoking status: Current Every Day Smoker    Packs/day: 1.00    Years: 55.00    Pack years: 55.00    Types: Cigarettes  . Smokeless tobacco: Never Used  . Tobacco comment: down to 0.5ppd  Vaping Use  . Vaping Use: Never used  Substance Use Topics  . Alcohol use: Never  . Drug use: No    Mr.Liebman reports that he has been smoking cigarettes. He has a 55.00 pack-year smoking history. He has never used smokeless tobacco. He reports that he does not drink alcohol  and does not use drugs.  Tobacco Cessation: Current every day smoker with a 55 pack year smoking history  Past surgical hx, Family hx, Social hx all reviewed.  Current Outpatient Medications on File Prior to Visit  Medication Sig  . acetaminophen (TYLENOL) 500 MG tablet Take 1,000 mg by mouth every 6 (six) hours as needed for mild pain or moderate pain. For  pain  . ADVAIR DISKUS 250-50 MCG/DOSE AEPB Inhale 1 puff into the lungs in the morning and at bedtime.  . busPIRone (BUSPAR) 15 MG tablet Take 1 tablet (15 mg total) by mouth 2 (two) times daily.  Marland Kitchen doxazosin (CARDURA) 4 MG tablet Take 4 mg by mouth at bedtime.  . finasteride (PROSCAR) 5 MG tablet Take 5 mg by mouth at bedtime.  . fluticasone (FLONASE) 50 MCG/ACT nasal spray Use 2 spray(s) in each nostril once daily (Patient taking differently: Place 2 sprays into both nostrils daily as needed for allergies or rhinitis.)  . metoprolol tartrate (LOPRESSOR) 25 MG tablet Take 1 tablet (25 mg total) by mouth 2 (two) times daily.   No current facility-administered medications on file prior to visit.     Allergies  Allergen Reactions  . Aspirin Other (See Comments)    Acid reflux and drooling    Review Of Systems:  Constitutional:   No  weight loss, night sweats,  Fevers, chills,+  fatigue, or  lassitude.  HEENT:   No headaches,  Difficulty swallowing,  Tooth/dental problems, or  Sore throat,                No sneezing, itching, ear ache, nasal congestion, post nasal drip,   CV:  No chest pain,  Orthopnea, PND, swelling in lower extremities, anasarca, dizziness, palpitations, syncope.   GI  No heartburn, indigestion, abdominal pain, nausea, vomiting, diarrhea, change in bowel habits, loss of appetite, bloody stools.   Resp: + shortness of breath with exertion less at rest.  + baseline  excess mucus, no productive cough,  + non-productive cough,  No coughing up of blood.  No change in color of mucus.  Occasional  wheezing.  No chest wall deformity  Skin: no rash or lesions.  GU: no dysuria, change in color of urine, no urgency or frequency.  No flank pain, no hematuria   MS:  No joint pain or swelling.  + decreased range of motion.  No back pain.  Psych:  No change in mood or affect. No depression or anxiety.  No memory loss.   Vital Signs BP 112/72 (BP Location: Right Arm, Cuff  Size: Normal)   Pulse 62   Temp 97.7 F (36.5 C) (Oral)   Ht 5\' 6"  (1.676 m)   Wt 181 lb 3.2 oz (82.2 kg)   SpO2 97%   BMI 29.25 kg/m    Physical Exam:  General- No distress,  A&Ox3, pleasant ENT: No sinus tenderness, TM clear, pale nasal mucosa, no oral exudate,no post nasal drip, no LAN Cardiac: S1, S2, regular rate and rhythm, no murmur Chest: No wheeze/ rales/ dullness; no accessory muscle use, no nasal flaring, no sternal retractions, good air movement noted right upper lobe, right middle lobe Abd.: Soft Non-tender, ND, BS +, Body mass index is 29.25 kg/m. Ext: No clubbing cyanosis, edema Neuro:  normal strength, MAE x 4, A&O x 3, appropriate Skin: No rashes, warm and dry, no lesions Psych: normal mood and behavior   Assessment/Plan  Adenocarcinoma of the Right Lung Plan We will refer you to  Radiation Oncology for treatment. You will get a call to get this scheduled  COPD Plan Continue your baseline inhaler therapy with Advair and albuterol as needed Rinse mouth after use  Tobacco Abuse Plan Counseling for smoking cessation. Offered free nicotine patches and mints>> declined Quitting  is the single most powerful action you can take to decrease the risk of lung cancer and worsening pulmonary function.  Post ENB Pneumothorax 4/14 Treated with aspiration of air, hospital stay overnight Resolved per CXR 4/14 Plan -Be patient with the pain it is on its way out -You can apply some warm compress or cold pads depending on which gives you relief -Avoid excessive straining or weight lifting or coughing - f you develop new chest pain or if you experience chest pain like you did with your lung collapse, seek emergency care.  Please contact office for sooner follow up if symptoms do not improve or worsen or seek emergency care  Follow up sooner if you need [place] sooner.      Magdalen Spatz, NP 08/16/2020  4:14 PM

## 2020-08-16 NOTE — Patient Instructions (Signed)
It is good to see you today. We will refer you to radiation oncology at [facility] . They will manage your radiation treatment. Follow up with Dr. Valeta Harms in 3 months to ensure you are doing well. Work on quitting smoking. This is the single most powerful action you can take to decrease the risk of lung cancer and worsening pulmonary function.  Slowly increase activity. If you develop new chest pain or if you experience chest pain like you did with your lung collapse, seek emergency care.  Please contact office for sooner follow up if symptoms do not improve or worsen or seek emergency care  Follow up sooner if you need [place] sooner.

## 2020-08-19 ENCOUNTER — Telehealth: Payer: Self-pay | Admitting: Acute Care

## 2020-08-19 ENCOUNTER — Other Ambulatory Visit: Payer: Self-pay | Admitting: Acute Care

## 2020-08-19 DIAGNOSIS — C3491 Malignant neoplasm of unspecified part of right bronchus or lung: Secondary | ICD-10-CM

## 2020-08-19 NOTE — Telephone Encounter (Signed)
-----   Message from Magdalen Spatz, NP sent at 08/17/2020 11:41 AM EDT ----- Regarding: FW: Would like radiation oncology in [facility] [name], This is who we need to refer Patrick Morris to in [place]. Referral to [facility] to see Dr. Pablo Ledger.  There are no [facility] rad onc in [place].  You can call Kim at [phone] or Pam at [phone]. He is the patient we saw yesterday at 4 pm. Can you please make the referral? Thanks so much ----- Message ----- From: Valrie Hart, RN Sent: 08/17/2020   9:04 AM EDT To: Magdalen Spatz, NP Subject: RE: Would like radiation oncology in [place]  Hey again, Referral to [facility] to see Dr. Pablo Ledger.  There are no [facility] rad onc there.  You can call Kim at [phone] or Pam at [phone].  Hope this helps, Patrick Morris ----- Message ----- From: Magdalen Spatz, NP Sent: 08/16/2020   4:13 PM EDT To: Valrie Hart, RN Subject: Would like radiation oncology in [facility],  How do we refer to [name] for rad onc? This patient is + for adeno, and would like to have his radiation in [facility].  Thanks so much,  [name]

## 2020-08-19 NOTE — Telephone Encounter (Signed)
I went ahead and sent in ref to [facility] hospital with Dr. Pablo Ledger. I also included kim and pam and their numbers in case any questions are needed. Nothing further needed.

## 2020-08-20 ENCOUNTER — Telehealth: Payer: Self-pay | Admitting: Medical

## 2020-08-20 MED ORDER — FINASTERIDE 5 MG PO TABS
5.0000 mg | ORAL_TABLET | Freq: Every day | ORAL | 2 refills | Status: DC
Start: 2020-08-20 — End: 2022-11-07

## 2020-08-20 NOTE — Telephone Encounter (Signed)
Rx sent 

## 2020-08-20 NOTE — Telephone Encounter (Signed)
Ok to refill 

## 2020-08-20 NOTE — Telephone Encounter (Signed)
Patient states dr Estill Dooms normally fills medication but he cant not get in with him later next week    Medication: finasteride (PROSCAR) 5 MG tablet   Has the patient contacted their pharmacy? No. (If no, request that the patient contact the pharmacy for the refill.) (If yes, when and what did the pharmacy advise?)  Preferred Pharmacy (with phone number or street name):  [address] Phone:  [phone]  Fax:  [phone]       Agent: Please be advised that RX refills may take up to 3 business days. We ask that you follow-up with your pharmacy.

## 2020-08-20 NOTE — Telephone Encounter (Signed)
Yes can refill proscar.

## 2020-08-25 NOTE — Progress Notes (Signed)
PCCM:  Thanks for seeing him   Garner Nash, DO [facility] 08/25/2020 12:29 PM

## 2020-09-01 LAB — CULTURE, FUNGUS WITHOUT SMEAR

## 2020-09-22 LAB — ACID FAST CULTURE WITH REFLEXED SENSITIVITIES (MYCOBACTERIA)
Acid Fast Culture: NEGATIVE
Acid Fast Culture: NEGATIVE

## 2020-09-29 ENCOUNTER — Ambulatory Visit: Payer: Medicare Other | Admitting: Pulmonary Disease

## 2020-10-04 ENCOUNTER — Encounter: Payer: Self-pay | Admitting: Gastroenterology

## 2020-10-04 DIAGNOSIS — I2510 Atherosclerotic heart disease of native coronary artery without angina pectoris: Secondary | ICD-10-CM | POA: Insufficient documentation

## 2020-10-04 DIAGNOSIS — H9190 Unspecified hearing loss, unspecified ear: Secondary | ICD-10-CM | POA: Insufficient documentation

## 2020-10-04 DIAGNOSIS — Z7901 Long term (current) use of anticoagulants: Secondary | ICD-10-CM | POA: Insufficient documentation

## 2020-10-04 DIAGNOSIS — Z79899 Other long term (current) drug therapy: Secondary | ICD-10-CM | POA: Insufficient documentation

## 2020-10-04 DIAGNOSIS — I2584 Coronary atherosclerosis due to calcified coronary lesion: Secondary | ICD-10-CM | POA: Insufficient documentation

## 2020-10-04 DIAGNOSIS — I484 Atypical atrial flutter: Secondary | ICD-10-CM | POA: Insufficient documentation

## 2020-10-07 NOTE — Progress Notes (Signed)
Cardiology Office Note:    Date:  10/08/2020   ID:  Patrick Morris, DOB [birth year], MRN [id number]  PCP:  Mackie Pai, PA-C  Cardiologist:  Shirlee More, MD    Referring MD: Mackie Pai, PA-C    ASSESSMENT:    1. Atypical atrial flutter ([place])   2. Chronic anticoagulation   3. Coronary artery calcification seen on CAT scan    PLAN:    In order of problems listed above:  Fortunately is in sinus rhythm no longer anticoagulated I would not resume an anticoagulant unless he has clinical recurrence. Stable he had no ischemia on perfusion imaging not having chest pain and I would not repeat an evaluation at this time   Next appointment: 1 year   Medication Adjustments/Labs and Tests Ordered: Current medicines are reviewed at length with the patient today.  Concerns regarding medicines are outlined above.  Orders Placed This Encounter  Procedures   EKG 12-Lead    No orders of the defined types were placed in this encounter.   Chief Complaint  Patient presents with   Follow-up   Atrial Flutter    History of Present Illness:    Patrick Morris is a 70 y.o. male with a hx of atypical atrial flutter chronic anticoagulation and coronary artery calcification on CT scan without evidence of ischemia on myocardial perfusion study.  He was last seen 04/13/2020 with rate controlled atrial flutter and remained anticoagulated.  His anticoagulation was discontinued in April.  Record review shows a new diagnosis of lung cancer non-small cell picked up on screening CT scan he has had bronchoscopy biopsy and a pneumothorax from the procedure.  He also has emphysema cholelithiasis and aortic atherosclerosis noted on CT imaging.  It was recommended for radiation therapy and he has been treated.  He also has a right renal lesion with previous ablation.  Compliance with diet, lifestyle and medications: Yes  He is tolerating his radiation therapy. No complaints of palpitations syncope chest  pain or change in usual shortness of breath. His anticoagulant was discontinued with bleeding. Past Medical History:  Diagnosis Date   Atypical atrial flutter (HCC)    BPH (benign prostatic hyperplasia)    his specialist is recommending surgery.   Cataract    Chronic anticoagulation    Colitis    COPD (chronic obstructive pulmonary disease) (HCC)    COPD exacerbation (HCC) 01/22/2014   Coronary artery calcification    Ear infection    GERD (gastroesophageal reflux disease)    Hard of hearing    Salmonella    Sinus infection     Past Surgical History:  Procedure Laterality Date   BRONCHIAL BIOPSY  08/10/2020   Procedure: BRONCHIAL BIOPSIES;  Surgeon: Garner Nash, DO;  Location: [facility];  Service: Pulmonary;;   BRONCHIAL BRUSHINGS  08/10/2020   Procedure: BRONCHIAL BRUSHINGS;  Surgeon: Garner Nash, DO;  Location: [facility];  Service: Pulmonary;;   BRONCHIAL NEEDLE ASPIRATION BIOPSY  08/10/2020   Procedure: BRONCHIAL NEEDLE ASPIRATION BIOPSIES;  Surgeon: Garner Nash, DO;  Location: [facility];  Service: Pulmonary;;   BRONCHIAL WASHINGS  08/10/2020   Procedure: BRONCHIAL WASHINGS;  Surgeon: Garner Nash, DO;  Location: [facility];  Service: Pulmonary;;   COLONOSCOPY     ENDOBRONCHIAL ULTRASOUND  08/10/2020   Procedure: ENDOBRONCHIAL ULTRASOUND;  Surgeon: Garner Nash, DO;  Location: [facility];  Service: Pulmonary;;   EYE SURGERY Right    FIDUCIAL MARKER PLACEMENT  08/10/2020   Procedure: FIDUCIAL  MARKER PLACEMENT;  Surgeon: Garner Nash, DO;  Location: [facility];  Service: Pulmonary;;   IR RADIOLOGIST EVAL & MGMT  05/07/2019   IR RADIOLOGIST EVAL & MGMT  05/27/2019   IR RADIOLOGIST EVAL & MGMT  10/23/2019   IR RADIOLOGIST EVAL & MGMT  01/15/2020   VIDEO BRONCHOSCOPY WITH ENDOBRONCHIAL NAVIGATION Right 08/10/2020   Procedure: VIDEO BRONCHOSCOPY WITH ENDOBRONCHIAL NAVIGATION;  Surgeon: Garner Nash, DO;  Location: [facility];  Service:  Pulmonary;  Laterality: Right;    Current Medications: Current Meds  Medication Sig   acetaminophen (TYLENOL) 500 MG tablet Take 1,000 mg by mouth every 6 (six) hours as needed for mild pain or moderate pain. For pain   ADVAIR DISKUS 250-50 MCG/DOSE AEPB Inhale 1 puff into the lungs in the morning and at bedtime.   busPIRone (BUSPAR) 15 MG tablet Take 1 tablet (15 mg total) by mouth 2 (two) times daily.   doxazosin (CARDURA) 4 MG tablet Take 4 mg by mouth at bedtime.   finasteride (PROSCAR) 5 MG tablet Take 1 tablet (5 mg total) by mouth at bedtime.   fluticasone (FLONASE) 50 MCG/ACT nasal spray Use 2 spray(s) in each nostril once daily (Patient taking differently: Place 2 sprays into both nostrils daily as needed for allergies or rhinitis.)     Allergies:   Aspirin   Social History   Socioeconomic History   Marital status: Married    Spouse name: Not on file   Number of children: Not on file   Years of education: Not on file   Highest education level: Not on file  Occupational History   Not on file  Tobacco Use   Smoking status: Former    Packs/day: 1.00    Years: 55.00    Pack years: 55.00    Types: Cigarettes   Smokeless tobacco: Never   Tobacco comments:    down to 0.5ppd  Vaping Use   Vaping Use: Never used  Substance and Sexual Activity   Alcohol use: Never   Drug use: No   Sexual activity: Not Currently    Birth control/protection: None  Other Topics Concern   Not on file  Social History Narrative   Not on file   Social Determinants of Health   Financial Resource Strain: Low Risk    Difficulty of Paying Living Expenses: Not hard at all  Food Insecurity: No Food Insecurity   Worried About Charity fundraiser in the Last Year: Never true   [place] in the Last Year: Never true  Transportation Needs: No Transportation Needs   Lack of Transportation (Medical): No   Lack of Transportation (Non-Medical): No  Physical Activity: Inactive   Days of  Exercise per Week: 0 days   Minutes of Exercise per Session: 0 min  Stress: No Stress Concern Present   Feeling of Stress : Not at all  Social Connections: Not on file     Family History: The patient's family history includes Alcohol abuse in his father; Hyperlipidemia in his father; Lymphoma in his brother; Stroke in his father; Throat cancer in his brother. ROS:   Please see the history of present illness.    All other systems reviewed and are negative.  EKGs/Labs/Other Studies Reviewed:    The following studies were reviewed today:  EKG:  EKG ordered today and personally reviewed.  The ekg ordered today demonstrates sinus rhythm normal EKG  Recent Labs: 07/01/2020: ALT 20 08/10/2020: BUN 15; Creatinine, Ser 0.61; Hemoglobin  13.9; Platelets 294; Potassium 3.5; Sodium 139  Recent Lipid Panel    Component Value Date/Time   CHOL 112 09/11/2017 0843   TRIG 81.0 09/11/2017 0843   HDL 26.80 (L) 09/11/2017 0843   CHOLHDL 4 09/11/2017 0843   VLDL 16.2 09/11/2017 0843   LDLCALC 69 09/11/2017 0843    Physical Exam:    VS:  BP 130/80 (BP Location: Left Arm, Patient Position: Sitting, Cuff Size: Normal)   Pulse 64   Ht 5\' 6"  (1.676 m)   Wt 189 lb (85.7 kg)   SpO2 96%   BMI 30.51 kg/m     Wt Readings from Last 3 Encounters:  10/08/20 189 lb (85.7 kg)  08/16/20 181 lb 3.2 oz (82.2 kg)  08/12/20 177 lb 6.4 oz (80.5 kg)     GEN:  Well nourished, well developed in no acute distress HEENT: Normal NECK: No JVD; No carotid bruits LYMPHATICS: No lymphadenopathy CARDIAC: Distant heart sounds RRR, no murmurs, rubs, gallops RESPIRATORY: Diminished breath sounds on auscultation without rales, wheezing or rhonchi  ABDOMEN: Soft, non-tender, non-distended MUSCULOSKELETAL:  No edema; No deformity  SKIN: Warm and dry NEUROLOGIC:  Alert and oriented x 3 PSYCHIATRIC:  Normal affect    Signed, Shirlee More, MD  10/08/2020 2:41 PM    [facility]

## 2020-10-08 ENCOUNTER — Encounter: Payer: Self-pay | Admitting: Cardiology

## 2020-10-08 ENCOUNTER — Other Ambulatory Visit: Payer: Self-pay

## 2020-10-08 ENCOUNTER — Ambulatory Visit (INDEPENDENT_AMBULATORY_CARE_PROVIDER_SITE_OTHER): Payer: Medicare Other | Admitting: Cardiology

## 2020-10-08 VITALS — BP 130/80 | HR 64 | Ht 66.0 in | Wt 189.0 lb

## 2020-10-08 DIAGNOSIS — I2510 Atherosclerotic heart disease of native coronary artery without angina pectoris: Secondary | ICD-10-CM

## 2020-10-08 DIAGNOSIS — I484 Atypical atrial flutter: Secondary | ICD-10-CM | POA: Diagnosis not present

## 2020-10-08 DIAGNOSIS — Z7901 Long term (current) use of anticoagulants: Secondary | ICD-10-CM

## 2020-10-08 NOTE — Patient Instructions (Signed)

## 2020-10-18 ENCOUNTER — Other Ambulatory Visit: Payer: Self-pay | Admitting: Medical

## 2020-10-19 ENCOUNTER — Telehealth: Payer: Self-pay | Admitting: Medical

## 2020-10-19 DIAGNOSIS — J441 Chronic obstructive pulmonary disease with (acute) exacerbation: Secondary | ICD-10-CM

## 2020-10-19 MED ORDER — FLUTICASONE-SALMETEROL 250-50 MCG/ACT IN AEPB: 1.0000 | INHALATION_SPRAY | Freq: Two times a day (BID) | RESPIRATORY_TRACT | Status: DC

## 2020-10-19 NOTE — Addendum Note (Signed)
Addended by: Jeronimo Greaves on: 10/19/2020 04:10 PM   Modules accepted: Orders

## 2020-10-19 NOTE — Telephone Encounter (Signed)
Medication: ADVAIR DISKUS 250-50 MCG/DOSE AEPB [[id number]]      Has the patient contacted their pharmacy? No. (If no, request that the patient contact the pharmacy for the refill.) (If yes, when and what did the pharmacy advise?)     Preferred Pharmacy (with phone number or street name): [facility] [address]  [address], [place] [address]  Phone:  [phone]  Fax:  [phone]     Agent: Please be advised that RX refills may take up to 3 business days. We ask that you follow-up with your pharmacy.

## 2020-10-19 NOTE — Telephone Encounter (Signed)
Ok to send in advair ?

## 2020-10-20 ENCOUNTER — Other Ambulatory Visit: Payer: Self-pay

## 2020-10-20 MED ORDER — FLUTICASONE-SALMETEROL 250-50 MCG/ACT IN AEPB: 1.0000 | INHALATION_SPRAY | Freq: Two times a day (BID) | RESPIRATORY_TRACT | 2 refills | Status: DC

## 2020-10-20 NOTE — Telephone Encounter (Signed)
Inhaler sent

## 2020-11-14 ENCOUNTER — Other Ambulatory Visit: Payer: Self-pay

## 2020-11-14 ENCOUNTER — Emergency Department (HOSPITAL_BASED_OUTPATIENT_CLINIC_OR_DEPARTMENT_OTHER): Payer: Medicare Other

## 2020-11-14 ENCOUNTER — Emergency Department (HOSPITAL_BASED_OUTPATIENT_CLINIC_OR_DEPARTMENT_OTHER)
Admission: EM | Admit: 2020-11-14 | Discharge: 2020-11-14 | Disposition: A | Discharge status: Home / Self Care | Payer: Medicare Other | Attending: Emergency Medicine | Admitting: Emergency Medicine

## 2020-11-14 DIAGNOSIS — R5381 Other malaise: Secondary | ICD-10-CM | POA: Diagnosis not present

## 2020-11-14 DIAGNOSIS — Z87891 Personal history of nicotine dependence: Secondary | ICD-10-CM | POA: Diagnosis not present

## 2020-11-14 DIAGNOSIS — J181 Lobar pneumonia, unspecified organism: Secondary | ICD-10-CM | POA: Insufficient documentation

## 2020-11-14 DIAGNOSIS — Z20822 Contact with and (suspected) exposure to covid-19: Secondary | ICD-10-CM | POA: Insufficient documentation

## 2020-11-14 DIAGNOSIS — J189 Pneumonia, unspecified organism: Secondary | ICD-10-CM

## 2020-11-14 DIAGNOSIS — R0602 Shortness of breath: Secondary | ICD-10-CM | POA: Diagnosis not present

## 2020-11-14 DIAGNOSIS — Z7951 Long term (current) use of inhaled steroids: Secondary | ICD-10-CM | POA: Insufficient documentation

## 2020-11-14 DIAGNOSIS — J441 Chronic obstructive pulmonary disease with (acute) exacerbation: Secondary | ICD-10-CM | POA: Diagnosis not present

## 2020-11-14 DIAGNOSIS — R059 Cough, unspecified: Secondary | ICD-10-CM | POA: Diagnosis present

## 2020-11-14 LAB — CBC WITH DIFFERENTIAL/PLATELET
Abs Immature Granulocytes: 0.05 10*3/uL (ref 0.00–0.07)
Basophils Absolute: 0 10*3/uL (ref 0.0–0.1)
Basophils Relative: 0 %
Eosinophils Absolute: 0.3 10*3/uL (ref 0.0–0.5)
Eosinophils Relative: 3 %
HCT: 39.7 % (ref 39.0–52.0)
Hemoglobin: 13.7 g/dL (ref 13.0–17.0)
Immature Granulocytes: 0 %
Lymphocytes Relative: 5 %
Lymphs Abs: 0.7 10*3/uL (ref 0.7–4.0)
MCH: 30.5 pg (ref 26.0–34.0)
MCHC: 34.5 g/dL (ref 30.0–36.0)
MCV: 88.4 fL (ref 80.0–100.0)
Monocytes Absolute: 0.9 10*3/uL (ref 0.1–1.0)
Monocytes Relative: 7 %
Neutro Abs: 11 10*3/uL — ABNORMAL HIGH (ref 1.7–7.7)
Neutrophils Relative %: 85 %
Platelets: 241 10*3/uL (ref 150–400)
RBC: 4.49 MIL/uL (ref 4.22–5.81)
RDW: 12.5 % (ref 11.5–15.5)
WBC: 13 10*3/uL — ABNORMAL HIGH (ref 4.0–10.5)
nRBC: 0 % (ref 0.0–0.2)

## 2020-11-14 LAB — URINALYSIS, ROUTINE W REFLEX MICROSCOPIC
Bilirubin Urine: NEGATIVE
Glucose, UA: NEGATIVE mg/dL
Hgb urine dipstick: NEGATIVE
Ketones, ur: NEGATIVE mg/dL
Leukocytes,Ua: NEGATIVE
Nitrite: NEGATIVE
Protein, ur: NEGATIVE mg/dL
Specific Gravity, Urine: 1.025 (ref 1.005–1.030)
pH: 6 (ref 5.0–8.0)

## 2020-11-14 LAB — COMPREHENSIVE METABOLIC PANEL
ALT: 11 U/L (ref 0–44)
AST: 14 U/L — ABNORMAL LOW (ref 15–41)
Albumin: 4 g/dL (ref 3.5–5.0)
Alkaline Phosphatase: 96 U/L (ref 38–126)
Anion gap: 9 (ref 5–15)
BUN: 14 mg/dL (ref 8–23)
CO2: 24 mmol/L (ref 22–32)
Calcium: 8.6 mg/dL — ABNORMAL LOW (ref 8.9–10.3)
Chloride: 102 mmol/L (ref 98–111)
Creatinine, Ser: 0.8 mg/dL (ref 0.61–1.24)
GFR, Estimated: >60 mL/min (ref >60)
Glucose, Bld: 107 mg/dL — ABNORMAL HIGH (ref 70–99)
Potassium: 3.9 mmol/L (ref 3.5–5.1)
Sodium: 135 mmol/L (ref 135–145)
Total Bilirubin: 1.3 mg/dL — ABNORMAL HIGH (ref 0.3–1.2)
Total Protein: 7.5 g/dL (ref 6.5–8.1)

## 2020-11-14 LAB — LACTIC ACID, PLASMA: Lactic Acid, Venous: 1.1 mmol/L (ref 0.5–1.9)

## 2020-11-14 LAB — RESP PANEL BY RT-PCR (FLU A&B, COVID) ARPGX2
Influenza A by PCR: NEGATIVE
Influenza B by PCR: NEGATIVE
SARS Coronavirus 2 by RT PCR: NEGATIVE

## 2020-11-14 LAB — PROTIME-INR
INR: 1.1 (ref 0.8–1.2)
Prothrombin Time: 14.3 seconds (ref 11.4–15.2)

## 2020-11-14 LAB — APTT: aPTT: 35 seconds (ref 24–36)

## 2020-11-14 MED ORDER — BENZONATATE 100 MG PO CAPS: 100.0000 mg | ORAL_CAPSULE | Freq: Three times a day (TID) | ORAL | 0 refills | Status: DC

## 2020-11-14 MED ORDER — ONDANSETRON HCL 4 MG/2ML IJ SOLN
4.0000 mg | Freq: Once | INTRAMUSCULAR | Status: AC
  Administered 2020-11-14: 4 mg via INTRAVENOUS
  Filled 2020-11-14: qty 2

## 2020-11-14 MED ORDER — AMOXICILLIN-POT CLAVULANATE 875-125 MG PO TABS: 1.0000 | ORAL_TABLET | Freq: Two times a day (BID) | ORAL | 0 refills | Status: DC

## 2020-11-14 MED ORDER — SODIUM CHLORIDE 0.9 % IV SOLN
500.0000 mg | Freq: Once | INTRAVENOUS | Status: AC
  Administered 2020-11-14: 500 mg via INTRAVENOUS
  Filled 2020-11-14: qty 500

## 2020-11-14 MED ORDER — SODIUM CHLORIDE 0.9 % IV BOLUS
500.0000 mL | Freq: Once | INTRAVENOUS | Status: AC
  Administered 2020-11-14: 500 mL via INTRAVENOUS

## 2020-11-14 MED ORDER — ACETAMINOPHEN 500 MG PO TABS
1000.0000 mg | ORAL_TABLET | Freq: Once | ORAL | Status: AC
  Administered 2020-11-14: 1000 mg via ORAL
  Filled 2020-11-14: qty 2

## 2020-11-14 MED ORDER — SODIUM CHLORIDE 0.9 % IV SOLN
1.0000 g | Freq: Once | INTRAVENOUS | Status: AC
  Administered 2020-11-14: 1 g via INTRAVENOUS
  Filled 2020-11-14: qty 10

## 2020-11-14 NOTE — ED Provider Notes (Signed)
[facility] EMERGENCY DEPARTMENT Provider Note   CSN: [id number] Arrival date & time: 11/14/20  1356     History Chief Complaint  Patient presents with   Shortness of Patrick Morris is a 70 y.o. male.  HPI Patient states that he woke up this morning feeling quite fatigued, had chills as well as some nausea and some myalgias.  He states that he came to the ER for evaluation of this and states that he has been coughing since this morning as well.  He states that he is producing some yellowish-greenish sputum.  Denies any chest pain or shortness of breath.  Although he does state that he feels quite short of breath when he is coughing.  He denies any hemoptysis.  He states he has had no unilateral or bilateral leg swelling or calf pain.  Historically has been on a DOAC anticoagulant for his a flutter however was taken off this after a GI bleed.  Has a history of COPD states that he is currently not smoking has not smoked for quite some time.  States that he is using his inhalers as needed.  Does not feel that he has been wheezing at home  Denies any other associate symptoms.  No aggravating mitigating factors.     Past Medical History:  Diagnosis Date   Atypical atrial flutter (HCC)    BPH (benign prostatic hyperplasia)    his specialist is recommending surgery.   Cataract    Chronic anticoagulation    Colitis    COPD (chronic obstructive pulmonary disease) (HCC)    COPD exacerbation (HCC) 01/22/2014   Coronary artery calcification    Ear infection    GERD (gastroesophageal reflux disease)    Hard of hearing    Salmonella    Sinus infection     Patient Active Problem List   Diagnosis Date Noted   Hard of hearing    Coronary artery calcification    Chronic anticoagulation    Atypical atrial flutter (HCC)    Pneumothorax, right 08/10/2020   Pneumothorax 08/10/2020   Nodule of middle lobe of right lung 07/29/2020   Sinus infection    Salmonella    Ear  infection    COPD (chronic obstructive pulmonary disease) (HCC)    Colitis    Cataract    Pain in left foot 09/20/2016   Pain in left leg 09/20/2016   Wellness examination 02/24/2014   GERD (gastroesophageal reflux disease) 01/22/2014   COPD exacerbation ([place]) 01/22/2014   Skin lesion 01/22/2014   BPH (benign prostatic hyperplasia) 01/22/2014   Cough 01/22/2014    Past Surgical History:  Procedure Laterality Date   BRONCHIAL BIOPSY  08/10/2020   Procedure: BRONCHIAL BIOPSIES;  Surgeon: Garner Nash, DO;  Location: [facility];  Service: Pulmonary;;   BRONCHIAL BRUSHINGS  08/10/2020   Procedure: BRONCHIAL BRUSHINGS;  Surgeon: Garner Nash, DO;  Location: [facility];  Service: Pulmonary;;   BRONCHIAL NEEDLE ASPIRATION BIOPSY  08/10/2020   Procedure: BRONCHIAL NEEDLE ASPIRATION BIOPSIES;  Surgeon: Garner Nash, DO;  Location: [facility];  Service: Pulmonary;;   BRONCHIAL WASHINGS  08/10/2020   Procedure: BRONCHIAL WASHINGS;  Surgeon: Garner Nash, DO;  Location: [facility];  Service: Pulmonary;;   COLONOSCOPY     ENDOBRONCHIAL ULTRASOUND  08/10/2020   Procedure: ENDOBRONCHIAL ULTRASOUND;  Surgeon: Garner Nash, DO;  Location: [facility];  Service: Pulmonary;;   EYE SURGERY Right    FIDUCIAL MARKER PLACEMENT  08/10/2020  Procedure: FIDUCIAL MARKER PLACEMENT;  Surgeon: Garner Nash, DO;  Location: [facility];  Service: Pulmonary;;   IR RADIOLOGIST EVAL & MGMT  05/07/2019   IR RADIOLOGIST EVAL & MGMT  05/27/2019   IR RADIOLOGIST EVAL & MGMT  10/23/2019   IR RADIOLOGIST EVAL & MGMT  01/15/2020   VIDEO BRONCHOSCOPY WITH ENDOBRONCHIAL NAVIGATION Right 08/10/2020   Procedure: VIDEO BRONCHOSCOPY WITH ENDOBRONCHIAL NAVIGATION;  Surgeon: Garner Nash, DO;  Location: [facility];  Service: Pulmonary;  Laterality: Right;       Family History  Problem Relation Age of Onset   Hyperlipidemia Father    Stroke Father    Alcohol abuse Father    Throat cancer  Brother    Lymphoma Brother     Social History   Tobacco Use   Smoking status: Former    Packs/day: 1.00    Years: 55.00    Pack years: 55.00    Types: Cigarettes   Smokeless tobacco: Never   Tobacco comments:    down to 0.5ppd  Vaping Use   Vaping Use: Never used  Substance Use Topics   Alcohol use: Never   Drug use: No    Home Medications Prior to Admission medications   Medication Sig Start Date End Date Taking? Authorizing Provider  amoxicillin-clavulanate (AUGMENTIN) 875-125 MG tablet Take 1 tablet by mouth every 12 (twelve) hours. 11/14/20  Yes Madia Carvell S, PA  benzonatate (TESSALON) 100 MG capsule Take 1 capsule (100 mg total) by mouth every 8 (eight) hours. 11/14/20  Yes Tysheem Accardo, Ova [place] S, PA  acetaminophen (TYLENOL) 500 MG tablet Take 1,000 mg by mouth every 6 (six) hours as needed for mild pain or moderate pain. For pain    [provider]  busPIRone (BUSPAR) 15 MG tablet Take 1 tablet by mouth twice daily 10/18/20   Saguier, Percell Miller, PA-C  doxazosin (CARDURA) 4 MG tablet Take 4 mg by mouth at bedtime.    [provider]  finasteride (PROSCAR) 5 MG tablet Take 1 tablet (5 mg total) by mouth at bedtime. 08/20/20   Saguier, Percell Miller, PA-C  fluticasone [facility]) 50 MCG/ACT nasal spray Use 2 spray(s) in each nostril once daily Patient taking differently: Place 2 sprays into both nostrils daily as needed for allergies or rhinitis. 10/10/19   Saguier, Percell Miller, PA-C  fluticasone-salmeterol (ADVAIR DISKUS) 250-50 MCG/ACT AEPB Inhale 1 puff into the lungs in the morning and at bedtime. 10/20/20   Saguier, Percell Miller, PA-C  metoprolol tartrate (LOPRESSOR) 25 MG tablet Take 1 tablet (25 mg total) by mouth 2 (two) times daily. 03/29/20 06/27/20  Richardo Priest, MD    Allergies    Aspirin  Review of Systems   Review of Systems  Constitutional:  Positive for chills, fatigue and fever.  HENT:  Negative for congestion.   Eyes:  Negative for pain.  Respiratory:  Positive  for cough. Negative for shortness of breath.   Cardiovascular:  Negative for chest pain and leg swelling.  Gastrointestinal:  Negative for abdominal pain and vomiting.  Genitourinary:  Negative for dysuria.  Musculoskeletal:  Positive for myalgias.  Skin:  Negative for rash.  Neurological:  Negative for dizziness and headaches.   Physical Exam Updated Vital Signs BP 116/60 (BP Location: Right Arm)   Pulse 90   Temp 99.6 F (37.6 C) (Oral)   Resp 20   Ht 5\' 7"  (1.702 m)   Wt 81.6 kg   SpO2 95%   BMI 28.19 kg/m   Physical Exam Vitals and  nursing note reviewed.  Constitutional:      General: He is not in acute distress.    Comments: 70 year old male appears somewhat fatigued appearing, pleasant, able answer questions appropriate follow commands.  Is alert and oriented x3.  No acute distress.  No diaphoresis.  Nontoxic-appearing.  HENT:     Head: Normocephalic and atraumatic.     Nose: Nose normal.  Eyes:     General: No scleral icterus. Cardiovascular:     Rate and Rhythm: Normal rate and regular rhythm.     Pulses: Normal pulses.     Heart sounds: Normal heart sounds.  Pulmonary:     Effort: Pulmonary effort is normal. No respiratory distress.     Breath sounds: Rales present. No wheezing.     Comments: Crackles in the left base.  No other auscultated crackles.  Tachypnea with rate of approximately 28 noted. Abdominal:     Palpations: Abdomen is soft.     Tenderness: There is no abdominal tenderness. There is no guarding or rebound.  Musculoskeletal:     Cervical back: Normal range of motion.     Right lower leg: No edema.     Left lower leg: No edema.  Skin:    General: Skin is warm and dry.     Capillary Refill: Capillary refill takes less than 2 seconds.  Neurological:     Mental Status: He is alert. Mental status is at baseline.  Psychiatric:        Mood and Affect: Mood normal.        Behavior: Behavior normal.    ED Results / Procedures / Treatments    Labs (all labs ordered are listed, but only abnormal results are displayed) Labs Reviewed  COMPREHENSIVE METABOLIC PANEL - Abnormal; Notable for the following components:      Result Value   Glucose, Bld 107 (*)    Calcium 8.6 (*)    AST 14 (*)    Total Bilirubin 1.3 (*)    All other components within normal limits  CBC WITH DIFFERENTIAL/PLATELET - Abnormal; Notable for the following components:   WBC 13.0 (*)    Neutro Abs 11.0 (*)    All other components within normal limits  RESP PANEL BY RT-PCR (FLU A&B, COVID) ARPGX2  URINE CULTURE  CULTURE, BLOOD (ROUTINE X 2) W REFLEX TO ID PANEL  CULTURE, BLOOD (ROUTINE X 2) W REFLEX TO ID PANEL  LACTIC ACID, PLASMA  PROTIME-INR  APTT  URINALYSIS, ROUTINE W REFLEX MICROSCOPIC    EKG EKG Interpretation  Date/Time:  Sunday November 14 2020 14:15:39 EDT Ventricular Rate:  98 PR Interval:  193 QRS Duration: 99 QT Interval:  352 QTC Calculation: 450 R Axis:   8 Text Interpretation: Sinus rhythm Abnormal inferior Q waves Probable anteroseptal infarct, old No old tracing to compare Confirmed by Deno Etienne [phone]) on 11/14/2020 3:38:56 PM  Radiology DG Chest Port 1 View  Result Date: 11/14/2020 CLINICAL DATA:  Questionable sepsis.  Evaluate for abnormality. EXAM: PORTABLE CHEST 1 VIEW COMPARISON:  August 12, 2020 FINDINGS: The heart size remains mildly enlarged. The hila and mediastinum are unremarkable. No pneumothorax. An oval nodular density in the left perihilar region was not seen in April 2022. Mild opacity in the left retrocardiac region is nonspecific may represent atelectasis. No other convincing infiltrate. No other acute abnormalities. IMPRESSION: 1. There is an oval density in the left perihilar region not seen in April 2022. This could represent a prominent vessel on end versus a  pulmonary nodule. Recommend a PA and lateral chest x-ray before discharge for better evaluation. 2. Mild atelectasis in the left retrocardiac region. No  convincing evidence of pneumonia. Electronically Signed   By: Dorise Bullion III M.D   On: 11/14/2020 15:05    Procedures Procedures   Medications Ordered in ED Medications  acetaminophen (TYLENOL) tablet 1,000 mg (1,000 mg Oral Given 11/14/20 1507)  ondansetron (ZOFRAN) injection 4 mg (4 mg Intravenous Given 11/14/20 1508)  sodium chloride 0.9 % bolus 500 mL (0 mLs Intravenous Stopped 11/14/20 1700)  cefTRIAXone (ROCEPHIN) 1 g in sodium chloride 0.9 % 100 mL IVPB (0 g Intravenous Stopped 11/14/20 1720)  azithromycin (ZITHROMAX) 500 mg in sodium chloride 0.9 % 250 mL IVPB (0 mg Intravenous Stopped 11/14/20 1740)    ED Course  I have reviewed the triage vital signs and the nursing notes.  Pertinent labs & imaging results that were available during my care of the patient were reviewed by me and considered in my medical decision making (see chart for details).    MDM Rules/Calculators/A&P                          Patient is a 70 year old male presented today with cough, fever, shortness of breath related to coughing and fatigue and malaise  Was found to be febrile tachycardic chest x-ray with no focal pneumonia evident but when compared to prior x-rays there evidence of left lower lobe pneumonia.  He also has left upper lobe nodule.  Was not apparent in April.  He is understanding of this finding and of the need for close follow-up with PCP for recheck.  COVID and influenza negative.  CMP unremarkable CBC without anemia but there is a leukocytosis present which is likely secondary to his pneumonia.  Coags within normal limits lactic within normal limits  I discussed this case with my attending physician who cosigned this note including patient's presenting symptoms, physical exam, and planned diagnostics and interventions. Attending physician stated agreement with plan or made changes to plan which were implemented.   Attending physician assessed patient at bedside.   Given patient's age  and comorbidities did recommend admission to hospital.  Patient declines this.  I had lengthy discussion with her about the positive   Tachycardia and fever resolved with Tylenol.  Patient given 1 dose of IV abx. Again recommended admission which he declines.  Will discharge home with Augmentin.  Benzonatate for cough.  Return precautions given.  Patient is agreeable to plan.  States he will follow-up with his primary care provider tomorrow or the next day.  Tremond Shimabukuro Smither was evaluated in Emergency Department on 11/14/2020 for the symptoms described in the history of present illness. He was evaluated in the context of the global COVID-19 pandemic, which necessitated consideration that the patient might be at risk for infection with the SARS-CoV-2 virus that causes COVID-19. Institutional protocols and algorithms that pertain to the evaluation of patients at risk for COVID-19 are in a state of rapid change based on information released by regulatory bodies including the CDC and federal and state organizations. These policies and algorithms were followed during the patient's care in the ED.   Final Clinical Impression(s) / ED Diagnoses Final diagnoses:  Community acquired pneumonia of left lower lobe of lung    Rx / DC Orders ED Discharge Orders          Ordered    amoxicillin-clavulanate (AUGMENTIN) 875-125 MG tablet  Every 12 hours        11/14/20 1657    benzonatate (TESSALON) 100 MG capsule  Every 8 hours        11/14/20 1657             Tedd Sias, [place] 11/14/20 [place], Virgie, DO 11/14/20 2235

## 2020-11-14 NOTE — ED Triage Notes (Signed)
Pt reports short of breath today. States "coughing all day". Hx of lung CA, COPD, and "collapsed lung". RT in triage to evaluate

## 2020-11-14 NOTE — ED Notes (Signed)
Ambulated from triage to room 7, SpO2 92-93%, RR 26-32, HR 107 max. Endorses +DOE.

## 2020-11-14 NOTE — Discharge Instructions (Addendum)
Your chest xray shows a left lobe pneumonia.   There is also a small pulmonary nodule in your left upper lobe.   Please have this rechecked by your primary care provider (once your symptoms resolve).   Please follow up in the next 3 days for recheck of your symptoms.

## 2020-11-14 NOTE — ED Notes (Signed)
Ambulatory to bathroom to have BM. Gait steady. No complaints of pain. Resp 28, 02 sat 95% on RA after ambulating. Patient states he is feeling better.

## 2020-11-16 LAB — URINE CULTURE: Culture: <10000 — AB

## 2020-11-19 LAB — CULTURE, BLOOD (ROUTINE X 2)
Culture: NO GROWTH
Culture: NO GROWTH
Special Requests: ADEQUATE

## 2020-11-22 ENCOUNTER — Ambulatory Visit: Payer: Medicare Other | Admitting: Gastroenterology

## 2020-12-22 ENCOUNTER — Encounter: Payer: Self-pay | Admitting: Medical

## 2020-12-22 ENCOUNTER — Other Ambulatory Visit: Payer: Self-pay

## 2020-12-22 ENCOUNTER — Ambulatory Visit (INDEPENDENT_AMBULATORY_CARE_PROVIDER_SITE_OTHER): Payer: Medicare Other | Admitting: Medical

## 2020-12-22 ENCOUNTER — Ambulatory Visit (HOSPITAL_BASED_OUTPATIENT_CLINIC_OR_DEPARTMENT_OTHER)
Admission: RE | Admit: 2020-12-22 | Discharge: 2020-12-22 | Disposition: A | Discharge status: Home / Self Care | Payer: Medicare Other | Source: Ambulatory Visit | Attending: Medical | Admitting: Medical

## 2020-12-22 VITALS — BP 110/60 | HR 67 | Temp 98.2°F | Resp 18 | Ht 66.0 in | Wt 190.8 lb

## 2020-12-22 DIAGNOSIS — C3492 Malignant neoplasm of unspecified part of left bronchus or lung: Secondary | ICD-10-CM | POA: Diagnosis not present

## 2020-12-22 DIAGNOSIS — J441 Chronic obstructive pulmonary disease with (acute) exacerbation: Secondary | ICD-10-CM | POA: Diagnosis not present

## 2020-12-22 DIAGNOSIS — F419 Anxiety disorder, unspecified: Secondary | ICD-10-CM | POA: Diagnosis not present

## 2020-12-22 DIAGNOSIS — R21 Rash and other nonspecific skin eruption: Secondary | ICD-10-CM

## 2020-12-22 DIAGNOSIS — Z8701 Personal history of pneumonia (recurrent): Secondary | ICD-10-CM | POA: Diagnosis not present

## 2020-12-22 MED ORDER — NYSTATIN-TRIAMCINOLONE 100000-0.1 UNIT/GM-% EX CREA
1.0000 | TOPICAL_CREAM | Freq: Two times a day (BID) | CUTANEOUS | 0 refills | Status: DC
Start: 2020-12-22 — End: 2022-08-04

## 2020-12-22 MED ORDER — CLONAZEPAM 0.5 MG PO TABS: ORAL_TABLET | ORAL | 0 refills | Status: DC

## 2020-12-22 NOTE — Progress Notes (Signed)
Subjective:    Patient ID: Patrick Morris, male    DOB: [birth year], 69 y.o.   MRN: [id number]  HPI  Pt in for follow up.  About one month ago was seen in the ED. Below is note.  Arrival date & time: 11/14/20  1356     History    Chief Complaint  Patient presents with   Shortness of [place] is a 70 y.o. male.   HPI "Patient states that he woke up this morning feeling quite fatigued, had chills as well as some nausea and some myalgias.  He states that he came to the ER for evaluation of this and states that he has been coughing since this morning as well.  He states that he is producing some yellowish-greenish sputum.  Denies any chest pain or shortness of breath.  Although he does state that he feels quite short of breath when he is coughing.  He denies any hemoptysis.  He states he has had no unilateral or bilateral leg swelling or calf pain.  Historically has been on a DOAC anticoagulant for his a flutter however was taken off this after a GI bleed.  Has a history of COPD states that he is currently not smoking has not smoked for quite some time.  States that he is using his inhalers as needed.  Does not feel that he has been wheezing at home  Denies any other associate symptoms.  No aggravating mitigating factors.  Review of Systems  Constitutional:  Positive for chills, fatigue and fever.  HENT:  Negative for congestion.   Eyes:  Negative for pain.  Respiratory:  Positive for cough. Negative for shortness of breath.   Cardiovascular:  Negative for chest pain and leg swelling.  Gastrointestinal:  Negative for abdominal pain and vomiting.  Genitourinary:  Negative for dysuria.  Musculoskeletal:  Positive for myalgias.  Skin:  Negative for rash.  Neurological:  Negative for dizziness and headaches.   Patient is a 70 year old male presented today with cough, fever, shortness of breath related to coughing and fatigue and malaise  Was found to be febrile  tachycardic chest x-ray with no focal pneumonia evident but when compared to prior x-rays there evidence of left lower lobe pneumonia.  He also has left upper lobe nodule.  Was not apparent in April.  He is understanding of this finding and of the need for close follow-up with PCP for recheck.   COVID and influenza negative.  CMP unremarkable CBC without anemia but there is a leukocytosis present which is likely secondary to his pneumonia.  Coags within normal limits lactic within normal limits   I discussed this case with my attending physician who cosigned this note including patient's presenting symptoms, physical exam, and planned diagnostics and interventions. Attending physician stated agreement with plan or made changes to plan which were implemented.    Attending physician assessed patient at bedside.    Given patient's age and comorbidities did recommend admission to hospital.  Patient declines this.  I had lengthy discussion with her about the positive    Tachycardia and fever resolved with Tylenol.  Patient given 1 dose of IV abx. Again recommended admission which he declines.   Will discharge home with Augmentin.  Benzonatate for cough.  Return precautions given.  Patient is agreeable to plan.  States he will follow-up with his primary care provider tomorrow or the next day."    Pt states dx lung  cancer. Has seen ocolologist for radiation tx. He has follow up with oncologist in September.  Note in care evreywhere reads.  ASSESSMENT: 1. Malignant neoplasm of middle lobe of right lung [facility])   PLAN: Sent copy of his lung CT reprot to pulmonology physician at [facility] and encouraged patient to call if he had not heard from their office by next week. Congratulated patient on not smoking! This is wonderful.  Follow up in 6 months with CT chest.  Radiation Oncology follow-up in 6 months or sooner should symptoms warrant.    Since treated for pneumonia no fever, no chills, no sweats or  bodyaches.  No obvious sob or wheezing.   Ptis using generic advair daily.   Pt stopped smoking August 10, 2020  Anxiety since quite smoking. After lung collapse states feel nervous. Pt is on buspar. Seems not to help with anxiety.      Review of Systems  Constitutional:  Negative for chills, fatigue and fever.  Respiratory:  Negative for cough, chest tightness, shortness of breath and wheezing.   Cardiovascular:  Negative for chest pain and palpitations.  Gastrointestinal:  Negative for abdominal pain.  Genitourinary:  Negative for dysuria and flank pain.  Musculoskeletal:  Negative for back pain and gait problem.  Skin:  Negative for rash.    Past Medical History:  Diagnosis Date   Atypical atrial flutter (HCC)    BPH (benign prostatic hyperplasia)    his specialist is recommending surgery.   Cataract    Chronic anticoagulation    Colitis    COPD (chronic obstructive pulmonary disease) (HCC)    COPD exacerbation (HCC) 01/22/2014   Coronary artery calcification    Ear infection    GERD (gastroesophageal reflux disease)    Hard of hearing    Salmonella    Sinus infection      Social History   Socioeconomic History   Marital status: Married    Spouse name: Not on file   Number of children: Not on file   Years of education: Not on file   Highest education level: Not on file  Occupational History   Not on file  Tobacco Use   Smoking status: Former    Packs/day: 1.00    Years: 55.00    Pack years: 55.00    Types: Cigarettes   Smokeless tobacco: Never   Tobacco comments:    down to 0.5ppd  Vaping Use   Vaping Use: Never used  Substance and Sexual Activity   Alcohol use: Never   Drug use: No   Sexual activity: Not Currently    Birth control/protection: None  Other Topics Concern   Not on file  Social History Narrative   Not on file   Social Determinants of Health   Financial Resource Strain: Not on file  Food Insecurity: Not on file  Transportation  Needs: Not on file  Physical Activity: Not on file  Stress: Not on file  Social Connections: Not on file  Intimate Partner Violence: Not on file    Past Surgical History:  Procedure Laterality Date   BRONCHIAL BIOPSY  08/10/2020   Procedure: BRONCHIAL BIOPSIES;  Surgeon: Garner Nash, DO;  Location: [place] [facility];  Service: Pulmonary;;   BRONCHIAL BRUSHINGS  08/10/2020   Procedure: BRONCHIAL BRUSHINGS;  Surgeon: Garner Nash, DO;  Location: [place];  Service: Pulmonary;;   BRONCHIAL NEEDLE ASPIRATION BIOPSY  08/10/2020   Procedure: BRONCHIAL NEEDLE ASPIRATION BIOPSIES;  Surgeon: Garner Nash, DO;  Location: [place]  [facility];  Service: Pulmonary;;   BRONCHIAL WASHINGS  08/10/2020   Procedure: BRONCHIAL WASHINGS;  Surgeon: Garner Nash, DO;  Location: [place];  Service: Pulmonary;;   COLONOSCOPY     ENDOBRONCHIAL ULTRASOUND  08/10/2020   Procedure: ENDOBRONCHIAL ULTRASOUND;  Surgeon: Garner Nash, DO;  Location: [place] [facility];  Service: Pulmonary;;   EYE SURGERY Right    FIDUCIAL MARKER PLACEMENT  08/10/2020   Procedure: FIDUCIAL MARKER PLACEMENT;  Surgeon: Garner Nash, DO;  Location: [place];  Service: Pulmonary;;   IR RADIOLOGIST EVAL & MGMT  05/07/2019   IR RADIOLOGIST EVAL & MGMT  05/27/2019   IR RADIOLOGIST EVAL & MGMT  10/23/2019   IR RADIOLOGIST EVAL & MGMT  01/15/2020   VIDEO BRONCHOSCOPY WITH ENDOBRONCHIAL NAVIGATION Right 08/10/2020   Procedure: VIDEO BRONCHOSCOPY WITH ENDOBRONCHIAL NAVIGATION;  Surgeon: Garner Nash, DO;  Location: [place];  Service: Pulmonary;  Laterality: Right;    Family History  Problem Relation Age of Onset   Hyperlipidemia Father    Stroke Father    Alcohol abuse Father    Throat cancer Brother    Lymphoma Brother     Allergies  Allergen Reactions   Aspirin Other (See Comments)    Acid reflux and drooling    Current Outpatient Medications on File Prior to Visit  Medication Sig Dispense Refill    acetaminophen (TYLENOL) 500 MG tablet Take 1,000 mg by mouth every 6 (six) hours as needed for mild pain or moderate pain. For pain     benzonatate (TESSALON) 100 MG capsule Take 1 capsule (100 mg total) by mouth every 8 (eight) hours. 21 capsule 0   busPIRone (BUSPAR) 15 MG tablet Take 1 tablet by mouth twice daily 60 tablet 0   doxazosin (CARDURA) 4 MG tablet Take 4 mg by mouth at bedtime.     finasteride (PROSCAR) 5 MG tablet Take 1 tablet (5 mg total) by mouth at bedtime. 30 tablet 2   fluticasone (FLONASE) 50 MCG/ACT nasal spray Use 2 spray(s) in each nostril once daily (Patient taking differently: Place 2 sprays into both nostrils daily as needed for allergies or rhinitis.) 16 g 1   fluticasone-salmeterol (ADVAIR DISKUS) 250-50 MCG/ACT AEPB Inhale 1 puff into the lungs in the morning and at bedtime. 180 each 2   amoxicillin-clavulanate (AUGMENTIN) 875-125 MG tablet Take 1 tablet by mouth every 12 (twelve) hours. (Patient not taking: Reported on 12/22/2020) 14 tablet 0   metoprolol tartrate (LOPRESSOR) 25 MG tablet Take 1 tablet (25 mg total) by mouth 2 (two) times daily. 180 tablet 3   No current facility-administered medications on file prior to visit.    BP 110/60 (BP Location: Right Arm, Patient Position: Sitting, Cuff Size: Normal)   Pulse 67   Temp 98.2 F (36.8 C) (Oral)   Resp 18   Ht 5\' 6"  (1.676 m)   Wt 190 lb 12.8 oz (86.5 kg)   SpO2 97%   BMI 30.80 kg/m       Objective:   Physical Exam  General- No acute distress. Pleasant patient. Neck- Full range of motion, no jvd Lungs- Clear, even and unlabored. Shallow even and unlabored.  Heart- regular rate and rhythm. Neurologic- CNII- XII grossly intact.  Rt forearm- clear presently(but hx of rash in past responded to mycolog ii)      Assessment & Plan:   History of pneumonia diagnosed and treated by the emergency department about 1 month ago.  Will get repeat chest x-ray today to  see if area has cleared up.  Clinically  much improved.  History of lung cancer.  Treated by oncologist and they recommended follow-up with pulmonologist as well.  So placed referral back to pulmonologist today.  Keep your follow-up appoint with oncologist as well.  History of COPD.  Continue on Advair inhaler.  Glad to hear that she quit smoking.  History of anxiety and worsening recently as he quit smoking.  BuSpar no longer effective.  DC BuSpar and prescribe low-dose clonazepam.  Rx advisement given.  History of intermittent skin rash which responded to nystatin/triamcinolone cream.  Refill that prescription today.  Follow-up in 1 month or sooner if needed.  Mackie Pai, PA-C

## 2020-12-22 NOTE — Patient Instructions (Signed)
History of pneumonia diagnosed and treated by the emergency department about 1 month ago.  Will get repeat chest x-ray today to see if area has cleared up.  Clinically much improved.  History of lung cancer.  Treated by oncologist and they recommended follow-up with pulmonologist as well.  So placed referral back to pulmonologist today.  Keep your follow-up appoint with oncologist as well.  History of COPD.  Continue on Advair inhaler.  Glad to hear that she quit smoking.  History of anxiety and worsening recently as he quit smoking.  BuSpar no longer effective.  DC BuSpar and prescribe low-dose clonazepam.  Rx advisement given.  History of intermittent skin rash which responded to nystatin/triamcinolone cream.  Refill that prescription today.  Follow-up in 1 month or sooner if needed.

## 2020-12-30 ENCOUNTER — Other Ambulatory Visit: Payer: Self-pay | Admitting: Medical

## 2021-01-04 ENCOUNTER — Other Ambulatory Visit: Payer: Self-pay

## 2021-01-04 ENCOUNTER — Ambulatory Visit (INDEPENDENT_AMBULATORY_CARE_PROVIDER_SITE_OTHER): Payer: Medicare Other | Admitting: Family

## 2021-01-04 VITALS — BP 130/65 | HR 64 | Temp 98.0°F | Resp 16 | Wt 188.0 lb

## 2021-01-04 DIAGNOSIS — Z23 Encounter for immunization: Secondary | ICD-10-CM | POA: Diagnosis not present

## 2021-01-04 DIAGNOSIS — S61412A Laceration without foreign body of left hand, initial encounter: Secondary | ICD-10-CM

## 2021-01-04 DIAGNOSIS — L03119 Cellulitis of unspecified part of limb: Secondary | ICD-10-CM

## 2021-01-04 HISTORY — DX: Cellulitis of unspecified part of limb: L03.119

## 2021-01-04 HISTORY — DX: Laceration without foreign body of left hand, initial encounter: S61.412A

## 2021-01-04 MED ORDER — CEPHALEXIN 500 MG PO CAPS: 500.0000 mg | ORAL_CAPSULE | Freq: Three times a day (TID) | ORAL | 0 refills | Status: DC

## 2021-01-04 NOTE — Assessment & Plan Note (Signed)
New. Will rx with Keflex.  Advised to call if increased pain/swelling/redness. Follow up in 1 week.

## 2021-01-04 NOTE — Assessment & Plan Note (Signed)
New. His tetanus is not up to date. Will rx with keflex, Tetanus today.

## 2021-01-04 NOTE — Progress Notes (Signed)
Subjective:     Patient ID: Patrick Morris, male    DOB: 22-Jan-1951, 70 y.o.   MRN: [id number]  Chief Complaint  Patient presents with   Foot Pain    Complains of left toot pain for about 3 days    Foot Pain   Pt presents today with chief complaint of foot pain in the left foot. Pain began 3 days ago. Denies known injury to his foot.    He also has a wound on his left hand. Reports that he cut himself on a rusty nail 4 days ago.   Health Maintenance Due  Topic Date Due   Hepatitis C Screening  Never done   Zoster Vaccines- Shingrix (1 of 2) Never done   COVID-19 Vaccine (3 - Moderna risk series) 01/28/2020    Past Medical History:  Diagnosis Date   Atypical atrial flutter (HCC)    BPH (benign prostatic hyperplasia)    his specialist is recommending surgery.   Cataract    Chronic anticoagulation    Colitis    COPD (chronic obstructive pulmonary disease) (HCC)    COPD exacerbation (HCC) 01/22/2014   Coronary artery calcification    Ear infection    GERD (gastroesophageal reflux disease)    Hard of hearing    Salmonella    Sinus infection     Past Surgical History:  Procedure Laterality Date   BRONCHIAL BIOPSY  08/10/2020   Procedure: BRONCHIAL BIOPSIES;  Surgeon: Garner Nash, DO;  Location: [place] [facility];  Service: Pulmonary;;   BRONCHIAL BRUSHINGS  08/10/2020   Procedure: BRONCHIAL BRUSHINGS;  Surgeon: Garner Nash, DO;  Location: [place] [facility];  Service: Pulmonary;;   BRONCHIAL NEEDLE ASPIRATION BIOPSY  08/10/2020   Procedure: BRONCHIAL NEEDLE ASPIRATION BIOPSIES;  Surgeon: Garner Nash, DO;  Location: [place] [facility];  Service: Pulmonary;;   BRONCHIAL WASHINGS  08/10/2020   Procedure: BRONCHIAL WASHINGS;  Surgeon: Garner Nash, DO;  Location: [place] [facility];  Service: Pulmonary;;   COLONOSCOPY     ENDOBRONCHIAL ULTRASOUND  08/10/2020   Procedure: ENDOBRONCHIAL ULTRASOUND;  Surgeon: Garner Nash, DO;  Location: [place] [facility];  Service: Pulmonary;;   EYE  SURGERY Right    FIDUCIAL MARKER PLACEMENT  08/10/2020   Procedure: FIDUCIAL MARKER PLACEMENT;  Surgeon: Garner Nash, DO;  Location: [place];  Service: Pulmonary;;   IR RADIOLOGIST EVAL & MGMT  05/07/2019   IR RADIOLOGIST EVAL & MGMT  05/27/2019   IR RADIOLOGIST EVAL & MGMT  10/23/2019   IR RADIOLOGIST EVAL & MGMT  01/15/2020   VIDEO BRONCHOSCOPY WITH ENDOBRONCHIAL NAVIGATION Right 08/10/2020   Procedure: VIDEO BRONCHOSCOPY WITH ENDOBRONCHIAL NAVIGATION;  Surgeon: Garner Nash, DO;  Location: [place];  Service: Pulmonary;  Laterality: Right;    Family History  Problem Relation Age of Onset   Hyperlipidemia Father    Stroke Father    Alcohol abuse Father    Throat cancer Brother    Lymphoma Brother     Social History   Socioeconomic History   Marital status: Married    Spouse name: Not on file   Number of children: Not on file   Years of education: Not on file   Highest education level: Not on file  Occupational History   Not on file  Tobacco Use   Smoking status: Former    Packs/day: 1.00    Years: 55.00    Pack years: 55.00    Types: Cigarettes   Smokeless tobacco: Never   Tobacco comments:  down to 0.5ppd  Vaping Use   Vaping Use: Never used  Substance and Sexual Activity   Alcohol use: Never   Drug use: No   Sexual activity: Not Currently    Birth control/protection: None  Other Topics Concern   Not on file  Social History Narrative   Not on file   Social Determinants of Health   Financial Resource Strain: Not on file  Food Insecurity: Not on file  Transportation Needs: Not on file  Physical Activity: Not on file  Stress: Not on file  Social Connections: Not on file  Intimate Partner Violence: Not on file    Outpatient Medications Prior to Visit  Medication Sig Dispense Refill   acetaminophen (TYLENOL) 500 MG tablet Take 1,000 mg by mouth every 6 (six) hours as needed for mild pain or moderate pain. For pain     benzonatate (TESSALON)  100 MG capsule Take 1 capsule (100 mg total) by mouth every 8 (eight) hours. 21 capsule 0   busPIRone (BUSPAR) 15 MG tablet Take 1 tablet by mouth twice daily 60 tablet 0   clonazePAM (KLONOPIN) 0.5 MG tablet 1/2-1 tab po daily prn severe anxiety. 20 tablet 0   doxazosin (CARDURA) 4 MG tablet Take 4 mg by mouth at bedtime.     finasteride (PROSCAR) 5 MG tablet Take 1 tablet (5 mg total) by mouth at bedtime. 30 tablet 2   fluticasone (FLONASE) 50 MCG/ACT nasal spray Place 2 sprays into both nostrils daily as needed for allergies or rhinitis. 16 g 2   fluticasone-salmeterol (ADVAIR DISKUS) 250-50 MCG/ACT AEPB Inhale 1 puff into the lungs in the morning and at bedtime. 180 each 2   nystatin-triamcinolone (MYCOLOG II) cream Apply 1 application topically 2 (two) times daily. 30 g 0   amoxicillin-clavulanate (AUGMENTIN) 875-125 MG tablet Take 1 tablet by mouth every 12 (twelve) hours. 14 tablet 0   metoprolol tartrate (LOPRESSOR) 25 MG tablet Take 1 tablet (25 mg total) by mouth 2 (two) times daily. 180 tablet 3   No facility-administered medications prior to visit.    Allergies  Allergen Reactions   Aspirin Other (See Comments)    Acid reflux and drooling    ROS     Objective:    Physical Exam Constitutional:      Appearance: Normal appearance.  HENT:     Head: Normocephalic and atraumatic.  Pulmonary:     Effort: Pulmonary effort is normal.  Skin:    General: Skin is warm.     Comments: Laceration left lateral hand  Some cracked skin between left 3rd/4th toes with surrounding tenderness and erythema  Neurological:     General: No focal deficit present.     Mental Status: He is alert and oriented to person, place, and time.  Psychiatric:        Mood and Affect: Mood normal.        Behavior: Behavior normal.        Thought Content: Thought content normal.        Judgment: Judgment normal.    BP 130/65 (BP Location: Right Arm, Patient Position: Sitting, Cuff Size: Small)    Pulse 64   Temp 98 F (36.7 C) (Oral)   Resp 16   Wt 188 lb (85.3 kg)   SpO2 99%   BMI 30.34 kg/m  Wt Readings from Last 3 Encounters:  01/04/21 188 lb (85.3 kg)  12/22/20 190 lb 12.8 oz (86.5 kg)  11/14/20 180 lb (81.6 kg)  Assessment & Plan:   Problem List Items Addressed This Visit       Unprioritized   Laceration of left hand without foreign body    New. His tetanus is not up to date. Will rx with keflex, Tetanus today.       Cellulitis of foot    New. Will rx with Keflex.  Advised to call if increased pain/swelling/redness. Follow up in 1 week.       Other Visit Diagnoses     Needs flu shot    -  Primary   Relevant Orders   Flu Vaccine QUAD High Dose(Fluad) (Completed)   Need for Td vaccine       Relevant Orders   Td : Tetanus/diphtheria >7yo Preservative  free (Completed)       I have discontinued Tashaun R. Edmister's amoxicillin-clavulanate. I am also having him start on cephALEXin. Additionally, I am having him maintain his acetaminophen, doxazosin, metoprolol tartrate, finasteride, busPIRone, fluticasone-salmeterol, benzonatate, clonazePAM, nystatin-triamcinolone, and fluticasone.  Meds ordered this encounter  Medications   cephALEXin (KEFLEX) 500 MG capsule    Sig: Take 1 capsule (500 mg total) by mouth 3 (three) times daily.    Dispense:  21 capsule    Refill:  0    Order Specific Question:   Supervising Provider    Answer:   Penni Homans A [[id number]]

## 2021-01-04 NOTE — Patient Instructions (Signed)
Begin Keflex 3x daily for skin infection.   Cellulitis, Adult Cellulitis is a skin infection. The infected area is often warm, red, swollen, and sore. It occurs most often in the arms and lower legs. It is very important to get treated for this condition. What are the causes? This condition is caused by bacteria. The bacteria enter through a break in the skin, such as a cut, burn, insect bite, open sore, or crack. What increases the risk? This condition is more likely to occur in people who: Have a weak body defense system (immune system). Have open cuts, burns, bites, or scrapes on the skin. Are older than 72 years of age. Have a blood sugar problem (diabetes). Have a long-lasting (chronic) liver disease (cirrhosis) or kidney disease. Are very overweight (obese). Have a skin problem, such as: Itchy rash (eczema). Slow movement of blood in the veins (venous stasis). Fluid buildup below the skin (edema). Have been treated with high-energy rays (radiation). Use IV drugs. What are the signs or symptoms? Symptoms of this condition include: Skin that is: Red. Streaking. Spotting. Swollen. Sore or painful when you touch it. Warm. A fever. Chills. Blisters. How is this diagnosed? This condition is diagnosed based on: Medical history. Physical exam. Blood tests. Imaging tests. How is this treated? Treatment for this condition may include: Medicines to treat infections or allergies. Home care, such as: Rest. Placing cold or warm cloths (compresses) on the skin. Hospital care, if the condition is very bad. Follow these instructions at home: Medicines Take over-the-counter and prescription medicines only as told by your doctor. If you were prescribed an antibiotic medicine, take it as told by your doctor. Do not stop taking it even if you start to feel better. General instructions  Drink enough fluid to keep your pee (urine) pale yellow. Do not touch or rub the infected  area. Raise (elevate) the infected area above the level of your heart while you are sitting or lying down. Place cold or warm cloths on the area as told by your doctor. Keep all follow-up visits as told by your doctor. This is important. Contact a doctor if: You have a fever. You do not start to get better after 1-2 days of treatment. Your bone or joint under the infected area starts to hurt after the skin has healed. Your infection comes back. This can happen in the same area or another area. You have a swollen bump in the area. You have new symptoms. You feel ill and have muscle aches and pains. Get help right away if: Your symptoms get worse. You feel very sleepy. You throw up (vomit) or have watery poop (diarrhea) for a long time. You see red streaks coming from the area. Your red area gets larger. Your red area turns dark in color. These symptoms may represent a serious problem that is an emergency. Do not wait to see if the symptoms will go away. Get medical help right away. Call your local emergency services (911 in the [place] Do not drive yourself to the hospital. Summary Cellulitis is a skin infection. The area is often warm, red, swollen, and sore. This condition is treated with medicines, rest, and cold and warm cloths. Take all medicines only as told by your doctor. Tell your doctor if symptoms do not start to get better after 1-2 days of treatment. This information is not intended to replace advice given to you by your health care provider. Make sure you discuss any questions you have with  your health care provider. Document Revised: 09/06/2017 Document Reviewed: 09/06/2017 Elsevier Patient Education  [place].

## 2021-01-06 ENCOUNTER — Ambulatory Visit (INDEPENDENT_AMBULATORY_CARE_PROVIDER_SITE_OTHER): Payer: Medicare Other | Admitting: Medical

## 2021-01-06 ENCOUNTER — Encounter: Payer: Self-pay | Admitting: Medical

## 2021-01-06 ENCOUNTER — Ambulatory Visit (HOSPITAL_BASED_OUTPATIENT_CLINIC_OR_DEPARTMENT_OTHER)
Admission: RE | Admit: 2021-01-06 | Discharge: 2021-01-06 | Disposition: A | Discharge status: Home / Self Care | Payer: Medicare Other | Source: Ambulatory Visit | Attending: Medical | Admitting: Medical

## 2021-01-06 ENCOUNTER — Other Ambulatory Visit: Payer: Self-pay

## 2021-01-06 VITALS — BP 118/74 | HR 60 | Resp 18 | Ht 66.0 in | Wt 188.0 lb

## 2021-01-06 DIAGNOSIS — M79672 Pain in left foot: Secondary | ICD-10-CM | POA: Diagnosis not present

## 2021-01-06 DIAGNOSIS — L03119 Cellulitis of unspecified part of limb: Secondary | ICD-10-CM | POA: Insufficient documentation

## 2021-01-06 LAB — CBC WITH DIFFERENTIAL/PLATELET
Basophils Absolute: 0.1 10*3/uL (ref 0.0–0.1)
Basophils Relative: 1.1 % (ref 0.0–3.0)
Eosinophils Absolute: 1.4 10*3/uL — ABNORMAL HIGH (ref 0.0–0.7)
Eosinophils Relative: 18.4 % — ABNORMAL HIGH (ref 0.0–5.0)
HCT: 39.1 % (ref 39.0–52.0)
Hemoglobin: 13 g/dL (ref 13.0–17.0)
Lymphocytes Relative: 19.1 % (ref 12.0–46.0)
Lymphs Abs: 1.5 10*3/uL (ref 0.7–4.0)
MCHC: 33.2 g/dL (ref 30.0–36.0)
MCV: 90.2 fl (ref 78.0–100.0)
Monocytes Absolute: 0.6 10*3/uL (ref 0.1–1.0)
Monocytes Relative: 7.4 % (ref 3.0–12.0)
Neutro Abs: 4.2 10*3/uL (ref 1.4–7.7)
Neutrophils Relative %: 54 % (ref 43.0–77.0)
Platelets: 308 10*3/uL (ref 150.0–400.0)
RBC: 4.34 Mil/uL (ref 4.22–5.81)
RDW: 13 % (ref 11.5–15.5)
WBC: 7.7 10*3/uL (ref 4.0–10.5)

## 2021-01-06 MED ORDER — CLINDAMYCIN HCL 150 MG PO CAPS: 150.0000 mg | ORAL_CAPSULE | Freq: Three times a day (TID) | ORAL | 0 refills | Status: DC

## 2021-01-06 MED ORDER — CEFTRIAXONE SODIUM 1 G IJ SOLR
1.0000 g | Freq: Once | INTRAMUSCULAR | Status: AC
Start: 2021-01-06 — End: 2021-01-06
  Administered 2021-01-06: 1 g via INTRAMUSCULAR

## 2021-01-06 NOTE — Patient Instructions (Signed)
Left foot infection/cellulitis has worsened over the last 2 days despite use of Keflex x3 days.  DC Keflex.  You had small amount of yellow discharge between third and fourth toe.  Wound culture.  Placed pen markings around the area of redness/pink area.  Will get CBC today stat and x-ray of left foot.   Follow labs culture and imaging.  Prescribing clindamycin antibiotic.  Rx advisement given regarding using probiotics over-the-counter and eating probiotic rich foods.  1 g Rocephin IM given.  If area of redness expands past margins of marked area then be seen in the emergency department.  Under the scenario IV antibiotics might be needed.  Also if within the margins redness worsens or any dark coloration to the skin be seen in the emergency department as well.  Patient and wife expressed understanding.  Follow-up Tuesday or sooner if needed.  Note patient did have plans to do short vacation on Saturday and Sunday at [facility].  Explained if area worsens as explained above be seen in the emergency department at a time.

## 2021-01-06 NOTE — Addendum Note (Signed)
Addended by: Jeronimo Greaves on: 01/06/2021 12:22 PM   Modules accepted: Orders

## 2021-01-06 NOTE — Progress Notes (Signed)
   Subjective:    Patient ID: Patrick Morris, male    DOB: 1950-12-10, 70 y.o.   MRN: [id number]  HPI  Pt in for follow up from visit 2 days ago with NP. Pt dx with cellulitis of foot. He was given keflex.  Area got worse since last visit.  No fever, no chills or sweats.   Physical exam description last visit.  'Some cracked skin between left 3rd/4th toes with surrounding tenderness and erythema "  Some worsening of redness now.      Review of Systems  Constitutional:  Negative for chills, fatigue and fever.  Respiratory:  Negative for cough, chest tightness, shortness of breath and wheezing.   Cardiovascular:  Negative for chest pain and palpitations.  Gastrointestinal:  Negative for abdominal pain and diarrhea.  Skin:        See hpi and exam.      Objective:   Physical Exam General- no acute ,distress Lungs- cta. Heart- rrr 10.5 cm x 5 cm from base of 2nd, 3rd and 4th toes. Up to mid foot. Area of redness. Small area between 3rd and 4th toes mild yellow dc. Marked area with pen. Got wound culture.  Left hand- small abrasion healing well.     Assessment & Plan:   Left foot infection/cellulitis has worsened over the last 2 days despite use of Keflex x3 days.  DC Keflex.  You had small amount of yellow discharge between third and fourth toe.  Wound culture.  Placed pen markings around the area of redness/pink area.  Will get CBC today stat and x-ray of left foot.   Follow labs culture and imaging.  Prescribing clindamycin antibiotic.  Rx advisement given regarding using probiotics over-the-counter and eating probiotic rich foods.  1 g Rocephin IM given.  If area of redness expands past margins of marked area then be seen in the emergency department.  Under the scenario IV antibiotics might be needed.  Also if within the margins redness worsens or any dark coloration to the skin be seen in the emergency department as well.  Patient and wife expressed  understanding.  Follow-up Tuesday or sooner if needed.  Note patient did have plans to do short vacation on Saturday and Sunday at [facility].  Explained if area worsens as explained above be seen in the emergency department at a time.  Mackie Pai, PA-C

## 2021-01-09 LAB — WOUND CULTURE
MICRO NUMBER:: 12347350
SPECIMEN QUALITY:: ADEQUATE

## 2021-01-11 ENCOUNTER — Ambulatory Visit (INDEPENDENT_AMBULATORY_CARE_PROVIDER_SITE_OTHER): Payer: Medicare Other | Admitting: Medical

## 2021-01-11 ENCOUNTER — Other Ambulatory Visit: Payer: Self-pay

## 2021-01-11 VITALS — BP 114/67 | HR 72 | Resp 18 | Ht 66.0 in | Wt 188.0 lb

## 2021-01-11 DIAGNOSIS — L089 Local infection of the skin and subcutaneous tissue, unspecified: Secondary | ICD-10-CM | POA: Diagnosis not present

## 2021-01-11 NOTE — Progress Notes (Addendum)
   Subjective:    Patient ID: Patrick Morris, male    DOB: 1951-02-08, 70 y.o.   MRN: [id number]  HPI Pt in for follow up for foot infection.   Wound culture showed mrsa. Sensitive to clindamycin 150 mg tid 10 days. Keflex was dc'd.  Pt states foot pain is less. No fever, no chills or sweats. Xray did not show any osteomyelitis.  WBC was not elevated.  Pt has only taken 3 tablets as he left tabs at home by accident before going to [place].    Review of Systems See hpi.    Objective:   Physical Exam  General- No acute distress. Pleasant patient. Lungs- Clear, even and unlabored. Heart- regular rate and rhythm. Neurologic- CNII- XII grossly intact.  Left foot- patient red area has receded about 90%. Now just pink appearance between 2nd and 3rd toe. 1.5 cm area of pinkish appearance. No induration. No fluctuance. No dc.        Assessment & Plan:   Left foot skin infection with mrsa but clindmycin and sensitivity list and has responded clinically.   Continue clindamycin antibiotic. I thinks you will do well since your foot looks better despite the fact you left rx at home over weekend.  Follow up in 10-14 days or sooner if needed.   Mackie Pai, PA-C

## 2021-01-11 NOTE — Patient Instructions (Addendum)
Left foot skin infection with mrsa but clindmycin and sensitivity list and has responded clinically.   Continue clindamycin antibiotic. I thinks you will do well since your foot looks better despite the fact you left rx at home over weekend.  Follow up in 10-14 days or sooner if needed.

## 2021-01-25 ENCOUNTER — Other Ambulatory Visit: Payer: Self-pay | Admitting: Medical

## 2021-01-25 NOTE — Telephone Encounter (Signed)
Requesting: klonopin  Contract: n/a UDS:03/2018 Last Visit:01/11/21 Next Visit: n/a Last Refill:12/22/20  Please Advise

## 2021-01-26 ENCOUNTER — Other Ambulatory Visit: Payer: Self-pay

## 2021-01-26 DIAGNOSIS — Z79899 Other long term (current) drug therapy: Secondary | ICD-10-CM

## 2021-01-26 DIAGNOSIS — F419 Anxiety disorder, unspecified: Secondary | ICD-10-CM

## 2021-01-26 NOTE — Telephone Encounter (Signed)
Lab appointment made for 01/27/21

## 2021-01-27 ENCOUNTER — Other Ambulatory Visit: Payer: Self-pay

## 2021-01-27 ENCOUNTER — Other Ambulatory Visit: Payer: Medicare Other

## 2021-01-27 DIAGNOSIS — Z79899 Other long term (current) drug therapy: Secondary | ICD-10-CM

## 2021-01-27 DIAGNOSIS — F419 Anxiety disorder, unspecified: Secondary | ICD-10-CM

## 2021-01-30 LAB — DRUG MONITORING PANEL 376104, URINE
Amphetamines: NEGATIVE ng/mL (ref <500)
Barbiturates: NEGATIVE ng/mL (ref <300)
Benzodiazepines: NEGATIVE ng/mL (ref <100)
Cocaine Metabolite: NEGATIVE ng/mL (ref <150)
Desmethyltramadol: NEGATIVE ng/mL (ref <100)
Opiates: NEGATIVE ng/mL (ref <100)
Oxycodone: NEGATIVE ng/mL (ref <100)
Tramadol: NEGATIVE ng/mL (ref <100)

## 2021-01-30 LAB — DM TEMPLATE

## 2021-01-31 NOTE — Telephone Encounter (Signed)
Patient got UDS and signed contract last week , please advise

## 2021-02-02 MED ORDER — CLONAZEPAM 0.5 MG PO TABS: ORAL_TABLET | ORAL | 1 refills | Status: DC

## 2021-02-02 NOTE — Addendum Note (Signed)
Addended by: Anabel Halon on: 02/02/2021 06:36 PM   Modules accepted: Orders

## 2021-03-29 ENCOUNTER — Other Ambulatory Visit: Payer: Self-pay | Admitting: Medical

## 2021-03-30 NOTE — Telephone Encounter (Addendum)
Requesting: klonopin  Contract:01/26/21 UDS:01/26/21 Last Visit:01/11/21 Next Visit:n/a Last Refill:02/02/21  Please Advise   Rx sent to pt pharmacy.  Mackie Pai, PA-C

## 2021-04-11 ENCOUNTER — Ambulatory Visit (INDEPENDENT_AMBULATORY_CARE_PROVIDER_SITE_OTHER): Payer: Medicare Other

## 2021-04-11 VITALS — Ht 66.0 in | Wt 188.0 lb

## 2021-04-11 DIAGNOSIS — Z0000 Encounter for general adult medical examination without abnormal findings: Secondary | ICD-10-CM

## 2021-04-11 NOTE — Patient Instructions (Signed)
[name] , Thank you for taking time to complete your Medicare Wellness Visit. I appreciate your ongoing commitment to your health goals. Please review the following plan we discussed and let me know if I can assist you in the future.   Screening recommendations/referrals: Colonoscopy:  Completed 02/19/2013. Repeat every 10 years Recommended yearly ophthalmology/optometry visit for glaucoma screening and checkup Recommended yearly dental visit for hygiene and checkup  Vaccinations: Influenza vaccine: Up to date Pneumococcal vaccine: Up to date Tdap vaccine: Up to date Shingles vaccine: Discuss with pharmacy   Covid-19: Booster available at the pharmacy  Advanced directives: Information mailed today  Conditions/risks identified: See problem list  Next appointment: Follow up in one year for your annual wellness visit.   Preventive Care 72 Years and Older, Male Preventive care refers to lifestyle choices and visits with your health care provider that can promote health and wellness. What does preventive care include? A yearly physical exam. This is also called an annual well check. Dental exams once or twice a year. Routine eye exams. Ask your health care provider how often you should have your eyes checked. Personal lifestyle choices, including: Daily care of your teeth and gums. Regular physical activity. Eating a healthy diet. Avoiding tobacco and drug use. Limiting alcohol use. Practicing safe sex. Taking low doses of aspirin every day. Taking vitamin and mineral supplements as recommended by your health care provider. What happens during an annual well check? The services and screenings done by your health care provider during your annual well check will depend on your age, overall health, lifestyle risk factors, and family history of disease. Counseling  Your health care provider may ask you questions about your: Alcohol use. Tobacco use. Drug use. Emotional  well-being. Home and relationship well-being. Sexual activity. Eating habits. History of falls. Memory and ability to understand (cognition). Work and work Statistician. Screening  You may have the following tests or measurements: Height, weight, and BMI. Blood pressure. Lipid and cholesterol levels. These may be checked every 5 years, or more frequently if you are over 5 years old. Skin check. Lung cancer screening. You may have this screening every year starting at age 72 if you have a 30-pack-year history of smoking and currently smoke or have quit within the past 15 years. Fecal occult blood test (FOBT) of the stool. You may have this test every year starting at age 72. Flexible sigmoidoscopy or colonoscopy. You may have a sigmoidoscopy every 5 years or a colonoscopy every 10 years starting at age 72. Prostate cancer screening. Recommendations will vary depending on your family history and other risks. Hepatitis C blood test. Hepatitis B blood test. Sexually transmitted disease (STD) testing. Diabetes screening. This is done by checking your blood sugar (glucose) after you have not eaten for a while (fasting). You may have this done every 1-3 years. Abdominal aortic aneurysm (AAA) screening. You may need this if you are a current or former smoker. Osteoporosis. You may be screened starting at age 72 if you are at high risk. Talk with your health care provider about your test results, treatment options, and if necessary, the need for more tests. Vaccines  Your health care provider may recommend certain vaccines, such as: Influenza vaccine. This is recommended every year. Tetanus, diphtheria, and acellular pertussis (Tdap, Td) vaccine. You may need a Td booster every 10 years. Zoster vaccine. You may need this after age 72. Pneumococcal 13-valent conjugate (PCV13) vaccine. One dose is recommended after age 72. Pneumococcal polysaccharide (  PPSV23) vaccine. One dose is recommended after  age 72. Talk to your health care provider about which screenings and vaccines you need and how often you need them. This information is not intended to replace advice given to you by your health care provider. Make sure you discuss any questions you have with your health care provider. Document Released: 05/14/2015 Document Revised: 01/05/2016 Document Reviewed: 02/16/2015 Elsevier Interactive Patient Education  2017 [place] Prevention in the Home Falls can cause injuries. They can happen to people of all ages. There are many things you can do to make your home safe and to help prevent falls. What can I do on the outside of my home? Regularly fix the edges of walkways and driveways and fix any cracks. Remove anything that might make you trip as you walk through a door, such as a raised step or threshold. Trim any bushes or trees on the path to your home. Use bright outdoor lighting. Clear any walking paths of anything that might make someone trip, such as rocks or tools. Regularly check to see if handrails are loose or broken. Make sure that both sides of any steps have handrails. Any raised decks and porches should have guardrails on the edges. Have any leaves, snow, or ice cleared regularly. Use sand or salt on walking paths during winter. Clean up any spills in your garage right away. This includes oil or grease spills. What can I do in the bathroom? Use night lights. Install grab bars by the toilet and in the tub and shower. Do not use towel bars as grab bars. Use non-skid mats or decals in the tub or shower. If you need to sit down in the shower, use a plastic, non-slip stool. Keep the floor dry. Clean up any water that spills on the floor as soon as it happens. Remove soap buildup in the tub or shower regularly. Attach bath mats securely with double-sided non-slip rug tape. Do not have throw rugs and other things on the floor that can make you trip. What can I do in the  bedroom? Use night lights. Make sure that you have a light by your bed that is easy to reach. Do not use any sheets or blankets that are too big for your bed. They should not hang down onto the floor. Have a firm chair that has side arms. You can use this for support while you get dressed. Do not have throw rugs and other things on the floor that can make you trip. What can I do in the kitchen? Clean up any spills right away. Avoid walking on wet floors. Keep items that you use a lot in easy-to-reach places. If you need to reach something above you, use a strong step stool that has a grab bar. Keep electrical cords out of the way. Do not use floor polish or wax that makes floors slippery. If you must use wax, use non-skid floor wax. Do not have throw rugs and other things on the floor that can make you trip. What can I do with my stairs? Do not leave any items on the stairs. Make sure that there are handrails on both sides of the stairs and use them. Fix handrails that are broken or loose. Make sure that handrails are as long as the stairways. Check any carpeting to make sure that it is firmly attached to the stairs. Fix any carpet that is loose or worn. Avoid having throw rugs at the top or  bottom of the stairs. If you do have throw rugs, attach them to the floor with carpet tape. Make sure that you have a light switch at the top of the stairs and the bottom of the stairs. If you do not have them, ask someone to add them for you. What else can I do to help prevent falls? Wear shoes that: Do not have high heels. Have rubber bottoms. Are comfortable and fit you well. Are closed at the toe. Do not wear sandals. If you use a stepladder: Make sure that it is fully opened. Do not climb a closed stepladder. Make sure that both sides of the stepladder are locked into place. Ask someone to hold it for you, if possible. Clearly mark and make sure that you can see: Any grab bars or  handrails. First and last steps. Where the edge of each step is. Use tools that help you move around (mobility aids) if they are needed. These include: Canes. Walkers. Scooters. Crutches. Turn on the lights when you go into a dark area. Replace any light bulbs as soon as they burn out. Set up your furniture so you have a clear path. Avoid moving your furniture around. If any of your floors are uneven, fix them. If there are any pets around you, be aware of where they are. Review your medicines with your doctor. Some medicines can make you feel dizzy. This can increase your chance of falling. Ask your doctor what other things that you can do to help prevent falls. This information is not intended to replace advice given to you by your health care provider. Make sure you discuss any questions you have with your health care provider. Document Released: 02/11/2009 Document Revised: 09/23/2015 Document Reviewed: 05/22/2014 Elsevier Interactive Patient Education  2017 [employer].

## 2021-04-11 NOTE — Progress Notes (Addendum)
Subjective:   FRANKO HILLIKER is a 70 y.o. male who presents for Medicare Annual/Subsequent preventive examination.  I connected with [place] today by telephone and verified that I am speaking with the correct person using two identifiers. Location patient: home Location provider: work Persons participating in the virtual visit: patient, Marine scientist.    I discussed the limitations, risks, security and privacy concerns of performing an evaluation and management service by telephone and the availability of in person appointments. I also discussed with the patient that there may be a patient responsible charge related to this service. The patient expressed understanding and verbally consented to this telephonic visit.    Interactive audio and video telecommunications were attempted between this provider and patient, however failed, due to patient having technical difficulties OR patient did not have access to video capability.  We continued and completed visit with audio only.  Some vital signs may be absent or patient reported.   Time Spent with patient on telephone encounter: 20 minutes   Review of Systems     Cardiac Risk Factors include: advanced age (>11men, >54 women);male gender     Objective:    Today's Vitals   04/11/21 1342  Weight: 188 lb (85.3 kg)  Height: 5\' 6"  (1.676 m)   Body mass index is 30.34 kg/m.  Advanced Directives 04/11/2021 11/14/2020 08/10/2020 08/10/2020 07/01/2020 10/09/2019 08/07/2017  Does Patient Have a Medical Advance Directive? No No No No No No No  Would patient like information on creating a medical advance directive? Yes (MAU/Ambulatory/Procedural Areas - Information given) No - Patient declined No - Patient declined - - Yes (ED - Information included in AVS) No - Patient declined    Current Medications (verified) Outpatient Encounter Medications as of 04/11/2021  Medication Sig   acetaminophen (TYLENOL) 500 MG tablet Take 1,000 mg by mouth every 6 (six)  hours as needed for mild pain or moderate pain. For pain   busPIRone (BUSPAR) 15 MG tablet Take 1 tablet by mouth twice daily   clindamycin (CLEOCIN) 150 MG capsule Take 1 capsule (150 mg total) by mouth 3 (three) times daily.   clonazePAM (KLONOPIN) 0.5 MG tablet TAKE 1/2 TO 1 (ONE-HALF TO ONE) TABLET BY MOUTH ONCE DAILY AS NEEDED FOR  SEVERE  ANXIETY   doxazosin (CARDURA) 4 MG tablet Take 4 mg by mouth at bedtime.   finasteride (PROSCAR) 5 MG tablet Take 1 tablet (5 mg total) by mouth at bedtime.   fluticasone (FLONASE) 50 MCG/ACT nasal spray Place 2 sprays into both nostrils daily as needed for allergies or rhinitis.   fluticasone-salmeterol (ADVAIR DISKUS) 250-50 MCG/ACT AEPB Inhale 1 puff into the lungs in the morning and at bedtime.   nystatin-triamcinolone (MYCOLOG II) cream Apply 1 application topically 2 (two) times daily.   benzonatate (TESSALON) 100 MG capsule Take 1 capsule (100 mg total) by mouth every 8 (eight) hours. (Patient not taking: Reported on 01/06/2021)   metoprolol tartrate (LOPRESSOR) 25 MG tablet Take 1 tablet (25 mg total) by mouth 2 (two) times daily.   No facility-administered encounter medications on file as of 04/11/2021.    Allergies (verified) Aspirin   History: Past Medical History:  Diagnosis Date   Atypical atrial flutter (HCC)    BPH (benign prostatic hyperplasia)    his specialist is recommending surgery.   Cataract    Chronic anticoagulation    Colitis    COPD (chronic obstructive pulmonary disease) (HCC)    COPD exacerbation ([place]) 01/22/2014   Coronary artery  calcification    Ear infection    GERD (gastroesophageal reflux disease)    Hard of hearing    Salmonella    Sinus infection    Past Surgical History:  Procedure Laterality Date   BRONCHIAL BIOPSY  08/10/2020   Procedure: BRONCHIAL BIOPSIES;  Surgeon: Garner Nash, DO;  Location: [facility];  Service: Pulmonary;;   BRONCHIAL BRUSHINGS  08/10/2020   Procedure: BRONCHIAL BRUSHINGS;   Surgeon: Garner Nash, DO;  Location: [facility];  Service: Pulmonary;;   BRONCHIAL NEEDLE ASPIRATION BIOPSY  08/10/2020   Procedure: BRONCHIAL NEEDLE ASPIRATION BIOPSIES;  Surgeon: Garner Nash, DO;  Location: [facility];  Service: Pulmonary;;   BRONCHIAL WASHINGS  08/10/2020   Procedure: BRONCHIAL WASHINGS;  Surgeon: Garner Nash, DO;  Location: [facility];  Service: Pulmonary;;   COLONOSCOPY     ENDOBRONCHIAL ULTRASOUND  08/10/2020   Procedure: ENDOBRONCHIAL ULTRASOUND;  Surgeon: Garner Nash, DO;  Location: [facility];  Service: Pulmonary;;   EYE SURGERY Right    FIDUCIAL MARKER PLACEMENT  08/10/2020   Procedure: FIDUCIAL MARKER PLACEMENT;  Surgeon: Garner Nash, DO;  Location: [facility];  Service: Pulmonary;;   IR RADIOLOGIST EVAL & MGMT  05/07/2019   IR RADIOLOGIST EVAL & MGMT  05/27/2019   IR RADIOLOGIST EVAL & MGMT  10/23/2019   IR RADIOLOGIST EVAL & MGMT  01/15/2020   VIDEO BRONCHOSCOPY WITH ENDOBRONCHIAL NAVIGATION Right 08/10/2020   Procedure: VIDEO BRONCHOSCOPY WITH ENDOBRONCHIAL NAVIGATION;  Surgeon: Garner Nash, DO;  Location: [facility];  Service: Pulmonary;  Laterality: Right;   Family History  Problem Relation Age of Onset   Hyperlipidemia Father    Stroke Father    Alcohol abuse Father    Throat cancer Brother    Lymphoma Brother    Social History   Socioeconomic History   Marital status: Married    Spouse name: Not on file   Number of children: Not on file   Years of education: Not on file   Highest education level: Not on file  Occupational History   Not on file  Tobacco Use   Smoking status: Former    Packs/day: 1.00    Years: 55.00    Pack years: 55.00    Types: Cigarettes   Smokeless tobacco: Never   Tobacco comments:    down to 0.5ppd  Vaping Use   Vaping Use: Never used  Substance and Sexual Activity   Alcohol use: Never   Drug use: No   Sexual activity: Not Currently    Birth control/protection: None  Other  Topics Concern   Not on file  Social History Narrative   Not on file   Social Determinants of Health   Financial Resource Strain: Low Risk    Difficulty of Paying Living Expenses: Not hard at all  Food Insecurity: No Food Insecurity   Worried About Charity fundraiser in the Last Year: Never true   [place] in the Last Year: Never true  Transportation Needs: No Transportation Needs   Lack of Transportation (Medical): No   Lack of Transportation (Non-Medical): No  Physical Activity: Inactive   Days of Exercise per Week: 0 days   Minutes of Exercise per Session: 0 min  Stress: No Stress Concern Present   Feeling of Stress : Not at all  Social Connections: Moderately Isolated   Frequency of Communication with Friends and Family: More than three times a week   Frequency of Social Gatherings with Friends  and Family: More than three times a week   Attends Religious Services: Never   Active Member of Clubs or Organizations: No   Attends Archivist Meetings: Never   Marital Status: Married    Tobacco Counseling Counseling given: Not Answered Tobacco comments: down to 0.5ppd   Clinical Intake:  Pre-visit preparation completed: Yes  Pain : No/denies pain     BMI - recorded: 30.34 Nutritional Status: BMI > 30  Obese Nutritional Risks: None Diabetes: No  How often do you need to have someone help you when you read instructions, pamphlets, or other written materials from your doctor or pharmacy?: 1 - Never  Diabetic?No  Interpreter Needed?: No  Information entered by :: Caroleen Hamman LPN   Activities of Daily Living In your present state of health, do you have any difficulty performing the following activities: 04/11/2021 08/10/2020  Hearing? N N  Vision? N Y  Difficulty concentrating or making decisions? N N  Walking or climbing stairs? N N  Dressing or bathing? N N  Doing errands, shopping? - Scientist, forensic and eating ? N -  Using the Toilet?  N -  In the past six months, have you accidently leaked urine? N -  Do you have problems with loss of bowel control? N -  Managing your Medications? N -  Managing your Finances? N -  Housekeeping or managing your Housekeeping? N -  Some recent data might be hidden    Patient Care Team: Saguier, Iris Pert as PCP - General (Physician Assistant)  Indicate any recent Medical Services you may have received from other than [facility] providers in the past year (date may be approximate).     Assessment:   This is a routine wellness examination for [place].  Hearing/Vision screen Hearing Screening - Comments:: No issues Vision Screening - Comments:: Last eye exam-2021  Dietary issues and exercise activities discussed: Current Exercise Habits: The patient does not participate in regular exercise at present, Exercise limited by: None identified   Goals Addressed             This Visit's Progress    Maintain current health   On track    Patient Stated   On track    Walking more       Depression Screen PHQ 2/9 Scores 04/11/2021 12/22/2020 10/09/2019 08/07/2017 02/26/2017 03/02/2015 02/24/2014  PHQ - 2 Score 0 0 0 0 2 0 0  PHQ- 9 Score - - - - 2 - -    Fall Risk Fall Risk  04/11/2021 12/22/2020 10/09/2019 08/07/2017 03/02/2015  Falls in the past year? 0 0 0 Yes No  Number falls in past yr: 0 0 0 1 -  Injury with Fall? 0 0 0 Yes -  Follow up Falls prevention discussed - - Falls prevention discussed;Education provided -    FALL RISK PREVENTION PERTAINING TO THE HOME:  Any stairs in or around the home? No  Home free of loose throw rugs in walkways, pet beds, electrical cords, etc? Yes  Adequate lighting in your home to reduce risk of falls? Yes   ASSISTIVE DEVICES UTILIZED TO PREVENT FALLS:  Life alert? No  Use of a cane, walker or w/c? No  Grab bars in the bathroom? Yes  Shower chair or bench in shower? No  Elevated toilet seat or a handicapped toilet? No   TIMED UP AND GO:  Was  the test performed? No . Phone visit   Cognitive Function:Normal cognitive status assessed  by this Nurse Health Advisor. No abnormalities found.   MMSE - Mini Mental State Exam 08/07/2017  Orientation to time 5  Orientation to Place 5  Registration 3  Attention/ Calculation 4  Recall 2  Language- name 2 objects 2  Language- repeat 1  Language- follow 3 step command 3  Language- read & follow direction 1  Write a sentence 1  Copy design 1  Total score 28        Immunizations Immunization History  Administered Date(s) Administered   Fluad Quad(high Dose 65+) 01/04/2021   Influenza Split 12/30/2013, 01/22/2014   Influenza, High Dose Seasonal PF 02/26/2017, 03/19/2018   Influenza,inj,Quad PF,6+ Mos 01/22/2014   Influenza-Unspecified 12/30/2013   Moderna Sars-Covid-2 Vaccination 12/03/2019, 12/31/2019   Pneumococcal Conjugate-13 01/25/2014   Pneumococcal Polysaccharide-23 02/03/2014, 02/26/2017   Td 01/04/2021    TDAP status: Up to date  Flu Vaccine status: Up to date  Pneumococcal vaccine status: Up to date  Covid-19 vaccine status: Information provided on how to obtain vaccines.   Qualifies for Shingles Vaccine? Yes   Zostavax completed No   Shingrix Completed?: No.    Education has been provided regarding the importance of this vaccine. Patient has been advised to call insurance company to determine out of pocket expense if they have not yet received this vaccine. Advised may also receive vaccine at local pharmacy or Health Dept. Verbalized acceptance and understanding.  Screening Tests Health Maintenance  Topic Date Due   Hepatitis C Screening  Never done   Zoster Vaccines- Shingrix (1 of 2) Never done   COVID-19 Vaccine (3 - Moderna risk series) 01/28/2020   COLONOSCOPY (Pts 45-47yrs Insurance coverage will need to be confirmed)  02/25/2024   TETANUS/TDAP  01/05/2031   Pneumonia Vaccine 70+ Years old  Completed   INFLUENZA VACCINE  Completed   HPV VACCINES   Aged Out    Health Maintenance  Health Maintenance Due  Topic Date Due   Hepatitis C Screening  Never done   Zoster Vaccines- Shingrix (1 of 2) Never done   COVID-19 Vaccine (3 - Moderna risk series) 01/28/2020    Colorectal cancer screening: Type of screening: Colonoscopy. Completed 02/19/2013. Repeat every 10 years  Lung Cancer Screening: (Low Dose CT Chest recommended if Age 49-80 years, 30 pack-year currently smoking OR have quit w/in 15years.) does not qualify.   Lung Cancer Screening : Completed 07/14/2020  Additional Screening:  Hepatitis C Screening: does qualify; Patient to discuss with PCP  Vision Screening: Recommended annual ophthalmology exams for early detection of glaucoma and other disorders of the eye. Is the patient up to date with their annual eye exam?  No  Who is the provider or what is the name of the office in which the patient attends annual eye exams? Pt unsure of name-advised to make an appt  Dental Screening: Recommended annual dental exams for proper oral hygiene  Community Resource Referral / Chronic Care Management: CRR required this visit?  No   CCM required this visit?  No      Plan:     I have personally reviewed and noted the following in the patient's chart:   Medical and social history Use of alcohol, tobacco or illicit drugs  Current medications and supplements including opioid prescriptions. Patient is not currently taking opioid prescriptions. Functional ability and status Nutritional status Physical activity Advanced directives List of other physicians Hospitalizations, surgeries, and ER visits in previous 12 months Vitals Screenings to include cognitive, depression, and falls Referrals  and appointments  In addition, I have reviewed and discussed with patient certain preventive protocols, quality metrics, and best practice recommendations. A written personalized care plan for preventive services as well as general preventive  health recommendations were provided to patient.   Due to this being a telephonic visit, the after visit summary with patients personalized plan was offered to patient via mail or my-chart. per request, patient was mailed a copy of [place], LPN   [id number]  Nurse Health Advisor  Nurse Notes: None  Review and Agree with assessment & plan of LPN   Mackie Pai, PA-C

## 2021-04-28 ENCOUNTER — Other Ambulatory Visit: Payer: Self-pay | Admitting: Medical

## 2021-04-28 NOTE — Telephone Encounter (Signed)
Requesting: clonazepam Contract:  UDS: 01/27/21 Last Visit: 01/11/21 Next Visit: none Last Refill:03/30/21  Please Advise

## 2021-04-29 NOTE — Telephone Encounter (Addendum)
Patient will be in on Tuesday to sign.  Ok. I went ahead and send rx just please make sure he comes in to sign the prescription.  Mackie Pai, PA-C

## 2021-05-04 ENCOUNTER — Other Ambulatory Visit: Payer: Self-pay | Admitting: Cardiology

## 2021-05-06 ENCOUNTER — Other Ambulatory Visit: Payer: Self-pay | Admitting: Cardiology

## 2021-06-06 ENCOUNTER — Other Ambulatory Visit: Payer: Self-pay | Admitting: Medical

## 2021-06-06 NOTE — Telephone Encounter (Addendum)
Requesting: clonazepam 0.5mg   Contract: 04/29/2021 UDS: 01/27/2021 Last Visit: 01/11/2021 Next Visit: None Last Refill: 04/29/2021 #20 and 0RF  Please Advise Rx refill sent to pt pharmacy. Will you get him scheduled for controlled med visit within a month. Thanks,

## 2021-07-01 ENCOUNTER — Other Ambulatory Visit: Payer: Self-pay | Admitting: Medical

## 2021-07-01 NOTE — Telephone Encounter (Addendum)
Requesting:klonopin  ?Contract:04/29/2021 ?UDS:12/2020 ?Last Visit:9/22 ?Next Visit:n/a ?Last Refill:06/07/21 ? ?Please Advise  ? ?Rx refill send to pharmacy. ? ?Mackie Pai, PA-C  ?

## 2021-07-15 ENCOUNTER — Ambulatory Visit (INDEPENDENT_AMBULATORY_CARE_PROVIDER_SITE_OTHER): Payer: Medicare Other | Admitting: Medical

## 2021-07-15 ENCOUNTER — Encounter: Payer: Self-pay | Admitting: Medical

## 2021-07-15 VITALS — BP 117/68 | HR 77 | Temp 98.2°F | Resp 18 | Ht 66.0 in | Wt 189.4 lb

## 2021-07-15 DIAGNOSIS — J3489 Other specified disorders of nose and nasal sinuses: Secondary | ICD-10-CM

## 2021-07-15 DIAGNOSIS — M791 Myalgia, unspecified site: Secondary | ICD-10-CM

## 2021-07-15 DIAGNOSIS — M65331 Trigger finger, right middle finger: Secondary | ICD-10-CM

## 2021-07-15 DIAGNOSIS — J40 Bronchitis, not specified as acute or chronic: Secondary | ICD-10-CM | POA: Diagnosis not present

## 2021-07-15 LAB — POC COVID19 BINAXNOW: SARS Coronavirus 2 Ag: NEGATIVE

## 2021-07-15 MED ORDER — DOXYCYCLINE HYCLATE 100 MG PO TABS: 100.0000 mg | ORAL_TABLET | Freq: Two times a day (BID) | ORAL | 0 refills | Status: DC

## 2021-07-15 MED ORDER — BENZONATATE 100 MG PO CAPS: 100.0000 mg | ORAL_CAPSULE | Freq: Three times a day (TID) | ORAL | 0 refills | Status: DC | PRN

## 2021-07-15 MED ORDER — FLUTICASONE PROPIONATE 50 MCG/ACT NA SUSP: 2.0000 | Freq: Every day | NASAL | 1 refills | Status: DC

## 2021-07-15 NOTE — Patient Instructions (Signed)
Recent acute onset of bronchitis and sinus pressure/sinusitis type symptoms.  Blood along with this came diffuse myalgias.  COVID test 2 days ago at home was negative and repeat COVID test today negative as well.  Consider doing a flu test but we do not have test in office today.  Note also patient's past 2-day timeframe that it could treat for flu in the event he had. ? ?Prescribing doxycycline antibiotic for bronchitis and sinus infection. ?Flonase nasal spray for congestion and prescribing benzonatate for cough. ? ?Former smoker with history of COPD.  O2 sat level good today.  Use Advair daily.  If you start to get wheezing or shortness of breath let me know and would prescribe taper dose prednisone. ? ?Recent new onset right third digit triggering him.  I will place referral to sports medicine. ? ?Follow-up in 7 to 10 days or sooner if needed. ?

## 2021-07-15 NOTE — Progress Notes (Signed)
? ?Subjective:  ? ? Patient ID: Patrick Morris, male    DOB: 1951/02/24, 71 y.o.   MRN: [id number] ? ?HPI ?Pt states has been sick for 3-4 days. Pt states body aches all over, productive cough, subjective fever and very tired.  ? ? ?Pt state he got over the counter covid test and was negative 2 days ago. ? ?Pt had 2 covid vaccines in the past.  ?Did get flu vaccine this year. ? ?Also pt states mid st.  ? ?Pt quit smoking over a year.  ? ? ?Also rt hand 3rd digit lock up/triggers. Occurring for one month. ? ? ? ?Review of Systems  ?Constitutional:  Positive for fatigue. Negative for chills and fever.  ?     Subjective fever.  ?HENT:  Positive for congestion and sinus pain.   ?Respiratory:  Positive for cough. Negative for chest tightness, shortness of breath and wheezing.   ?Cardiovascular:  Negative for chest pain and palpitations.  ?Gastrointestinal:  Negative for abdominal pain, constipation, nausea and rectal pain.  ?Musculoskeletal:  Positive for myalgias. Negative for back pain and neck pain.  ? ?Past Medical History:  ?Diagnosis Date  ? Atypical atrial flutter ([place])   ? BPH (benign prostatic hyperplasia)   ? his specialist is recommending surgery.  ? Cataract   ? Chronic anticoagulation   ? Colitis   ? COPD (chronic obstructive pulmonary disease) ([place])   ? COPD exacerbation ([place]) 01/22/2014  ? Coronary artery calcification   ? Ear infection   ? GERD (gastroesophageal reflux disease)   ? Hard of hearing   ? Salmonella   ? Sinus infection   ? ?  ?Social History  ? ?Socioeconomic History  ? Marital status: Married  ?  Spouse name: Not on file  ? Number of children: Not on file  ? Years of education: Not on file  ? Highest education level: Not on file  ?Occupational History  ? Not on file  ?Tobacco Use  ? Smoking status: Former  ?  Packs/day: 1.00  ?  Years: 55.00  ?  Pack years: 55.00  ?  Types: Cigarettes  ? Smokeless tobacco: Never  ? Tobacco comments:  ?  down to 0.5ppd  ?Vaping Use  ? Vaping Use: Never used   ?Substance and Sexual Activity  ? Alcohol use: Never  ? Drug use: No  ? Sexual activity: Not Currently  ?  Birth control/protection: None  ?Other Topics Concern  ? Not on file  ?Social History Narrative  ? Not on file  ? ?Social Determinants of Health  ? ?Financial Resource Strain: Low Risk   ? Difficulty of Paying Living Expenses: Not hard at all  ?Food Insecurity: No Food Insecurity  ? Worried About Charity fundraiser in the Last Year: Never true  ? Ran Out of Food in the Last Year: Never true  ?Transportation Needs: No Transportation Needs  ? Lack of Transportation (Medical): No  ? Lack of Transportation (Non-Medical): No  ?Physical Activity: Inactive  ? Days of Exercise per Week: 0 days  ? Minutes of Exercise per Session: 0 min  ?Stress: No Stress Concern Present  ? Feeling of Stress : Not at all  ?Social Connections: Moderately Isolated  ? Frequency of Communication with Friends and Family: More than three times a week  ? Frequency of Social Gatherings with Friends and Family: More than three times a week  ? Attends Religious Services: Never  ? Active Member of Clubs or Organizations:  No  ? Attends Archivist Meetings: Never  ? Marital Status: Married  ?Intimate Partner Violence: Not on file  ? ? ?Past Surgical History:  ?Procedure Laterality Date  ? BRONCHIAL BIOPSY  08/10/2020  ? Procedure: BRONCHIAL BIOPSIES;  Surgeon: Garner Nash, DO;  Location: [facility];  Service: Pulmonary;;  ? BRONCHIAL BRUSHINGS  08/10/2020  ? Procedure: BRONCHIAL BRUSHINGS;  Surgeon: Garner Nash, DO;  Location: [facility];  Service: Pulmonary;;  ? BRONCHIAL NEEDLE ASPIRATION BIOPSY  08/10/2020  ? Procedure: BRONCHIAL NEEDLE ASPIRATION BIOPSIES;  Surgeon: Garner Nash, DO;  Location: [facility];  Service: Pulmonary;;  ? BRONCHIAL WASHINGS  08/10/2020  ? Procedure: BRONCHIAL WASHINGS;  Surgeon: Garner Nash, DO;  Location: [facility];  Service: Pulmonary;;  ? COLONOSCOPY    ? ENDOBRONCHIAL ULTRASOUND   08/10/2020  ? Procedure: ENDOBRONCHIAL ULTRASOUND;  Surgeon: Garner Nash, DO;  Location: [facility];  Service: Pulmonary;;  ? EYE SURGERY Right   ? FIDUCIAL MARKER PLACEMENT  08/10/2020  ? Procedure: FIDUCIAL MARKER PLACEMENT;  Surgeon: Garner Nash, DO;  Location: [facility];  Service: Pulmonary;;  ? IR RADIOLOGIST EVAL & MGMT  05/07/2019  ? IR RADIOLOGIST EVAL & MGMT  05/27/2019  ? IR RADIOLOGIST EVAL & MGMT  10/23/2019  ? IR RADIOLOGIST EVAL & MGMT  01/15/2020  ? VIDEO BRONCHOSCOPY WITH ENDOBRONCHIAL NAVIGATION Right 08/10/2020  ? Procedure: VIDEO BRONCHOSCOPY WITH ENDOBRONCHIAL NAVIGATION;  Surgeon: Garner Nash, DO;  Location: [facility];  Service: Pulmonary;  Laterality: Right;  ? ? ?Family History  ?Problem Relation Age of Onset  ? Hyperlipidemia Father   ? Stroke Father   ? Alcohol abuse Father   ? Throat cancer Brother   ? Lymphoma Brother   ? ? ?Allergies  ?Allergen Reactions  ? Aspirin Other (See Comments)  ?  Acid reflux and drooling  ? ? ?Current Outpatient Medications on File Prior to Visit  ?Medication Sig Dispense Refill  ? acetaminophen (TYLENOL) 500 MG tablet Take 1,000 mg by mouth every 6 (six) hours as needed for mild pain or moderate pain. For pain    ? benzonatate (TESSALON) 100 MG capsule Take 1 capsule (100 mg total) by mouth every 8 (eight) hours. (Patient not taking: Reported on 01/06/2021) 21 capsule 0  ? busPIRone (BUSPAR) 15 MG tablet Take 1 tablet by mouth twice daily 60 tablet 0  ? clindamycin (CLEOCIN) 150 MG capsule Take 1 capsule (150 mg total) by mouth 3 (three) times daily. 30 capsule 0  ? clonazePAM (KLONOPIN) 0.5 MG tablet TAKE 1/2 TO 1 (ONE-HALF TO ONE) TABLET BY MOUTH ONCE DAILY AS NEEDED FOR ANXIETY 20 tablet 0  ? doxazosin (CARDURA) 4 MG tablet Take 4 mg by mouth at bedtime.    ? finasteride (PROSCAR) 5 MG tablet Take 1 tablet (5 mg total) by mouth at bedtime. 30 tablet 2  ? fluticasone (FLONASE) 50 MCG/ACT nasal spray Place 2 sprays into both nostrils daily as  needed for allergies or rhinitis. 16 g 2  ? fluticasone-salmeterol (ADVAIR DISKUS) 250-50 MCG/ACT AEPB Inhale 1 puff into the lungs in the morning and at bedtime. 180 each 2  ? metoprolol tartrate (LOPRESSOR) 25 MG tablet Take 1 tablet by mouth twice daily 180 tablet 0  ? nystatin-triamcinolone (MYCOLOG II) cream Apply 1 application topically 2 (two) times daily. 30 g 0  ? ?No current facility-administered medications on file prior to visit.  ? ? ?BP 117/68   Pulse 77   Temp 98.2 ?  F (36.8 ?C)   Resp 18   Ht 5\' 6"  (1.676 m)   Wt 189 lb 6.4 oz (85.9 kg)   SpO2 95%   BMI 30.57 kg/m?  ?  ?   ?Objective:  ? Physical Exam ? ?General ?Mental Status- Alert. General Appearance- Not in acute distress.  ? ?Skin ?General: Color- Normal Color. Moisture- Normal Moisture. ? ?Neck ?Carotid Arteries- Normal color. Moisture- Normal Moisture. No carotid bruits. No JVD. ? ?Chest and Lung Exam ?Lungs-shallow respirations bilaterally.  Even and unlabored. ? ?HEENT-no frontal sinus pressure palpation he does have maxillary sinus pressure. ? ?Cardiovascular ?Auscultation:Rythm- Regular. ?Murmurs & Other Heart Sounds:Auscultation of the heart reveals- No Murmurs. ? ?Abdomen ?Inspection:-Inspeection Normal. ?Palpation/Percussion:Note:No mass. Palpation and Percussion of the abdomen reveal- Non Tender, Non Distended + BS, no rebound or guarding. ? ? ?Neurologic ?Cranial Nerve exam:- CN III-XII intact(No nystagmus), symmetric smile. ?Strength:- 5/5 equal and symmetric strength both upper and lower extremities.  ? ?Rt hand- on flexion and extension of digits. Demonstrates some 3rd digit triggering. ?   ?Assessment & Plan:  ? ?Patient Instructions  ?Recent acute onset of bronchitis and sinus pressure/sinusitis type symptoms.  Blood along with this came diffuse myalgias.  COVID test 2 days ago at home was negative and repeat COVID test today negative as well.  Consider doing a flu test but we do not have test in office today.  Note also  patient's past 2-day timeframe that it could treat for flu in the event he had. ? ?Prescribing doxycycline antibiotic for bronchitis and sinus infection. ?Flonase nasal spray for congestion and prescribing benzonatate

## 2021-07-26 ENCOUNTER — Other Ambulatory Visit: Payer: Self-pay | Admitting: Medical

## 2021-07-26 NOTE — Telephone Encounter (Addendum)
Requesting: clonazepam 0.5mg   ?Contract: 04/29/2021 ?UDS: 01/27/2021 ?Last Visit: 07/15/21 ?Next Visit: None ?Last Refill: 07/01/21 #20 and 0RF ? ?Please Advise ?Rx refill sent to pt pharmacy ? ?Mackie Pai, PA-C  ? ?

## 2021-07-27 ENCOUNTER — Other Ambulatory Visit: Payer: Self-pay | Admitting: Medical

## 2021-08-09 ENCOUNTER — Other Ambulatory Visit: Payer: Self-pay | Admitting: Cardiology

## 2021-08-10 ENCOUNTER — Other Ambulatory Visit: Payer: Self-pay

## 2021-08-25 ENCOUNTER — Other Ambulatory Visit: Payer: Self-pay | Admitting: Medical

## 2021-08-25 NOTE — Telephone Encounter (Addendum)
Requesting: clonazepam ?Contract:  ?UDS: 01/27/21 ?Last Visit:  07/15/21 ?Next Visit: none   ?Last Refill: 07/26/21 ? ?Please Advise ? ?On Review he is getting clonazepam fairly regularly. Not sure why he does not have contract. Follow up in one month and update contract on that date. ? ?Mackie Pai, PA-C  ? ?

## 2021-09-20 ENCOUNTER — Other Ambulatory Visit: Payer: Self-pay | Admitting: Medical

## 2021-09-20 NOTE — Telephone Encounter (Addendum)
Requesting: klonopin  Contract:04/29/2021 UDS:01/27/2021 Last Visit:07/15/21 Next Visit:n/a Last Refill:08/25/21  Please Advise   Rx refill sent to pharmacy.  Mackie Pai, PA-C

## 2021-10-09 ENCOUNTER — Other Ambulatory Visit: Payer: Self-pay | Admitting: Medical

## 2021-10-10 NOTE — Telephone Encounter (Addendum)
Requesting: clonazepam 0.5mg   Contract: 04/29/21 UDS: 01/27/21 Last Visit: 07/15/21 Next Visit: None Last Refill: 09/20/21 #20 and 0RF  Please Advise  Rx sent to pharmacy .  Please schedule follow up within a month.  Mackie Pai, PA-C

## 2021-10-18 ENCOUNTER — Ambulatory Visit (INDEPENDENT_AMBULATORY_CARE_PROVIDER_SITE_OTHER): Payer: Medicare Other | Admitting: Family Medicine

## 2021-10-18 ENCOUNTER — Encounter: Payer: Self-pay | Admitting: Family Medicine

## 2021-10-18 VITALS — BP 104/58 | HR 60 | Temp 97.9°F | Ht 66.0 in | Wt 188.0 lb

## 2021-10-18 DIAGNOSIS — K047 Periapical abscess without sinus: Secondary | ICD-10-CM

## 2021-10-18 DIAGNOSIS — J069 Acute upper respiratory infection, unspecified: Secondary | ICD-10-CM

## 2021-10-18 MED ORDER — AMOXICILLIN-POT CLAVULANATE 875-125 MG PO TABS
1.0000 | ORAL_TABLET | Freq: Two times a day (BID) | ORAL | 0 refills | Status: AC
Start: 2021-10-18 — End: 2021-10-28

## 2021-10-18 NOTE — Progress Notes (Signed)
   Acute Office Visit  Subjective:     Patient ID: Patrick Morris, male    DOB: 1950-06-10, 71 y.o.   MRN: [id number]  CC: URI symptoms, tooth pain   URI  This is a new problem. The current episode started in the past 7 days (4 days ago). The maximum temperature recorded prior to his arrival was 100.4 - 100.9 F. The fever has been present for Less than 1 day. Associated symptoms include congestion, coughing, headaches, rhinorrhea, sinus pain and a sore throat. Pertinent negatives include no chest pain, ear pain, nausea or sneezing. Associated symptoms comments: Tooth pain (10/10 at times), bottom right - reports redness, swelling, pain with eating . Treatments tried: pseudofed, nose spray, tylenol. The treatment provided mild relief.  States he knows he should've had this tooth pulled awhile back, but he has not had a dentist recently. He has noticed some mild redness and swelling around the tooth and is unsure if he has had any drainage.   Review of systems All review of systems negative except what is listed in the HPI     Objective:    BP (!) 104/58   Pulse 60   Temp 97.9 F (36.6 C)   Ht 5\' 6"  (1.676 m)   Wt 188 lb (85.3 kg)   SpO2 99%   BMI 30.34 kg/m    Physical Exam Vitals reviewed.  HENT:     Head: Normocephalic and atraumatic.     Right Ear: Tympanic membrane normal.     Left Ear: Tympanic membrane normal.     Nose: Rhinorrhea present.     Mouth/Throat:     Mouth: Mucous membranes are moist.     Pharynx: Oropharynx is clear.     Comments: Tooth (bottom right) with minimal surrounding edema, erythema; no visible abscess or drainage; poor dental hygiene  Cardiovascular:     Rate and Rhythm: Normal rate and regular rhythm.  Pulmonary:     Effort: Pulmonary effort is normal.     Breath sounds: Normal breath sounds.  Musculoskeletal:     Cervical back: Normal range of motion and neck supple. No tenderness.  Lymphadenopathy:     Cervical: No cervical adenopathy.   Neurological:     Mental Status: He is alert.        No results found for any visits on 10/18/21.      Assessment & Plan:   1. Dental infection Starting Augmentin. Continue salt water rinse/gargles, warm compresses. See dentist as soon as possible. Patient aware of signs/symptoms requiring further/urgent evaluation.   - amoxicillin-clavulanate (AUGMENTIN) 875-125 MG tablet; Take 1 tablet by mouth 2 (two) times daily for 10 days.  Dispense: 20 tablet; Refill: 0  2. Viral upper respiratory tract infection Possibly secondary to dental infection vs new/separate infection/allergies. ABX should cover if bacterial. Continue supportive measures including rest, hydration, humidifier use, steam showers, warm compresses to sinuses, warm liquids with lemon and honey, and over-the-counter cough, cold, and analgesics as needed.     Return if symptoms worsen or fail to improve.  Terrilyn Saver, NP

## 2021-10-18 NOTE — Patient Instructions (Addendum)
For your sinus symptoms and probably tooth infection - starting antibiotics. Continue warm salt water gargles. Continue supportive measures including rest, hydration, humidifier use, steam showers, warm compresses to sinuses, warm liquids with lemon and honey, and over-the-counter cough, cold, and analgesics as needed.    Get an appointment with a dentist as soon as possible.

## 2021-10-20 ENCOUNTER — Other Ambulatory Visit: Payer: Self-pay | Admitting: Cardiology

## 2021-10-20 NOTE — Telephone Encounter (Signed)
Metoprolol tartrate 25 mg # 180 x 1 refills sent to  [facility]

## 2021-10-23 ENCOUNTER — Other Ambulatory Visit: Payer: Self-pay | Admitting: Medical

## 2021-10-28 ENCOUNTER — Other Ambulatory Visit: Payer: Self-pay | Admitting: Medical

## 2021-10-28 NOTE — Telephone Encounter (Addendum)
Requesting: clonazepam 0.5mg   Contract: 04/29/21 UDS: 01/27/21 Last Visit: 07/15/21 Next Visit: None Last Refill: 10/10/2021 #15 and 0RF Pt sig: 1/2 to 1 tab daily prn  Please Advise   Rx sent in to pharmacy.  Will you let me know what contract states. How many xanax per month. Tried to find but could  not find?   Thanks, Mackie Pai, PA-C

## 2021-11-10 ENCOUNTER — Telehealth: Payer: Self-pay

## 2021-11-10 MED ORDER — AMOXICILLIN-POT CLAVULANATE 875-125 MG PO TABS: 1.0000 | ORAL_TABLET | Freq: Two times a day (BID) | ORAL | 0 refills | Status: DC

## 2021-11-10 NOTE — Telephone Encounter (Signed)
Patient called, saw Patrick Morris for tooth infection 10-18-21. Was treated with antibiotic. Patient will like a second prescription due to not able to see dentist yet and "still having the infection symptoms such as pain"

## 2021-11-10 NOTE — Addendum Note (Signed)
Addended by: Anabel Halon on: 11/10/2021 03:41 PM   Modules accepted: Orders

## 2021-11-11 NOTE — Telephone Encounter (Signed)
Unable to contact patient or leave a voice mail at any of his numbers. Patient is not active on mychart either.

## 2021-11-15 ENCOUNTER — Other Ambulatory Visit: Payer: Self-pay | Admitting: Medical

## 2021-11-15 NOTE — Telephone Encounter (Addendum)
Requesting: klonopin  Contract:04/29/21 UDS:04/29/21 Last Visit:10/18/21 Next Visit:n/a Last Refill:10/28/21  Please Advise   Rx refill sent to pt pharmacy.  Mackie Pai, PA-C

## 2021-11-25 IMAGING — CT CT CHEST LUNG CANCER SCREENING LOW DOSE W/O CM
1 series · 10 of 10 positions shown, 13 images · non-contrast
Comparison: 02/20/2018 screening CT.  05/15/2018 chest CT.

CLINICAL DATA: 74-year-old male with 56 pack-year history of
smoking. Lung cancer screening.

EXAM:
CT CHEST WITHOUT CONTRAST LOW-DOSE FOR LUNG CANCER SCREENING
TECHNIQUE: Multidetector CT imaging of the chest was performed following the
standard protocol without IV contrast.

[ct lung segmentation data · axial · 0.71mm/px · z∈[-335,-335]mm · 10 of 304 frames shown]
[frame 1/304  mediastinal]
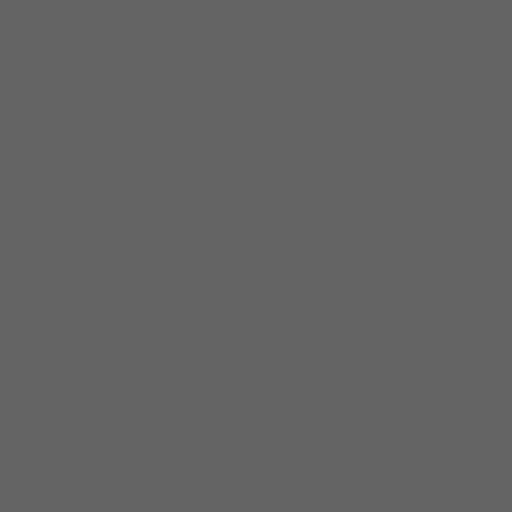
[frame 1/304  lung]
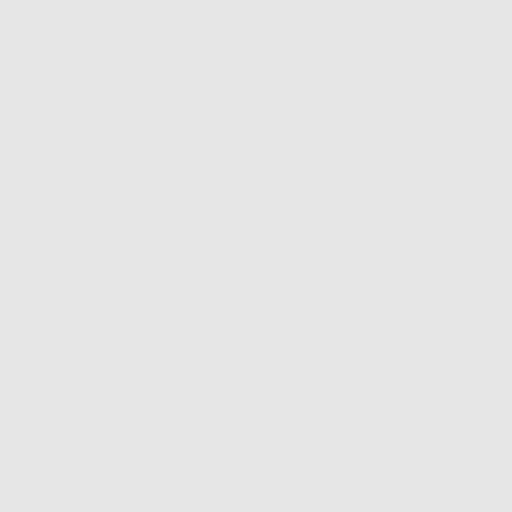
[frame 34/304  lung]
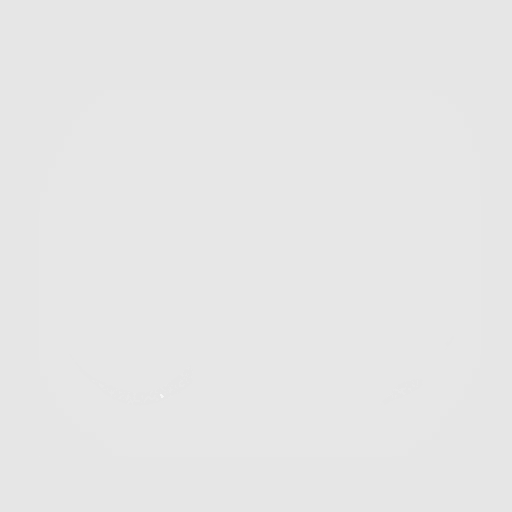
[frame 68/304  lung]
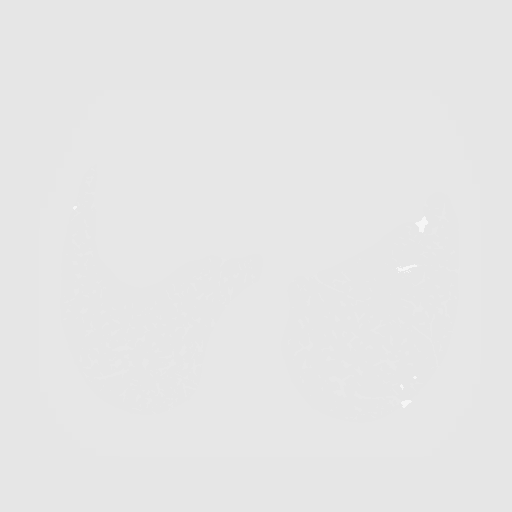
[frame 102/304  lung]
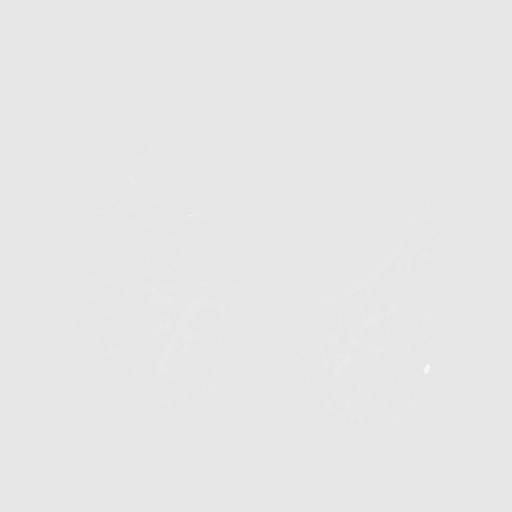
[frame 135/304  mediastinal]
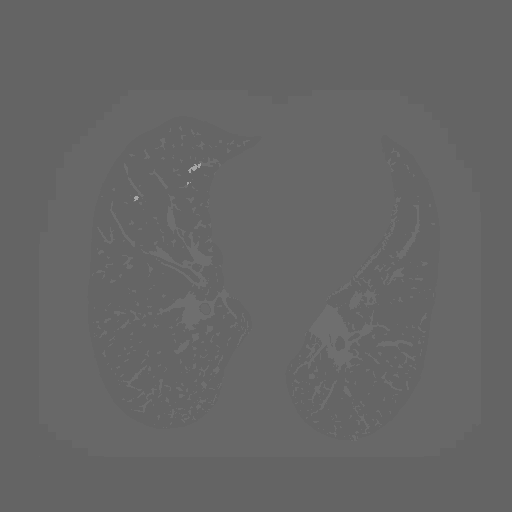
[frame 135/304  lung]
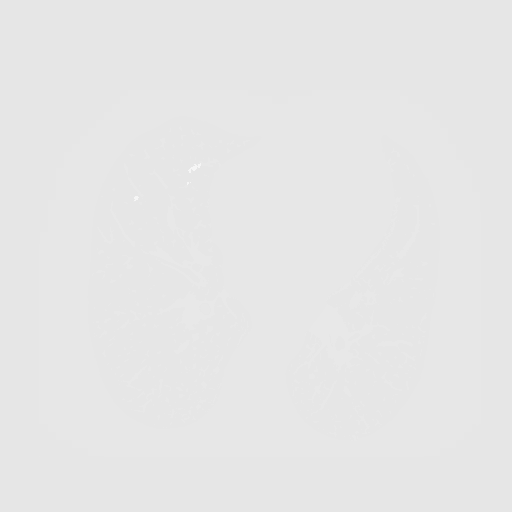
[frame 169/304  lung]
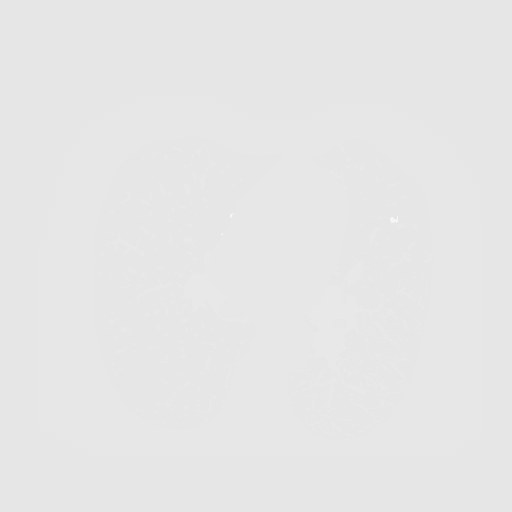
[frame 203/304  lung]
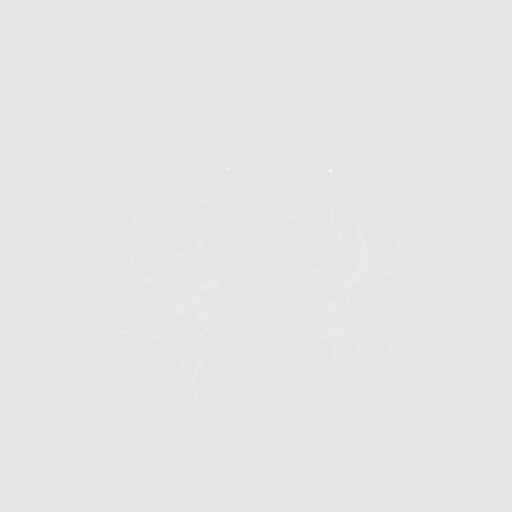
[frame 236/304  lung]
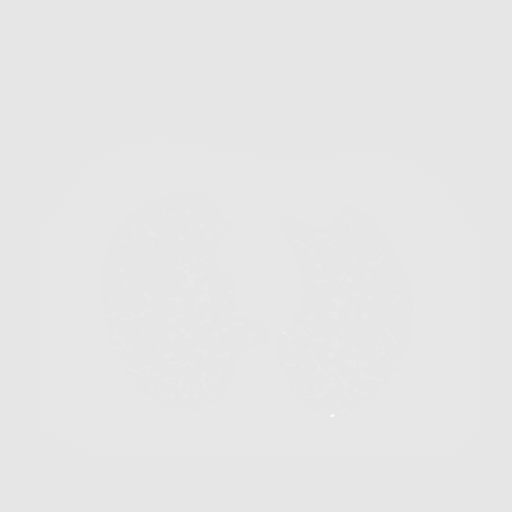
[frame 270/304  mediastinal]
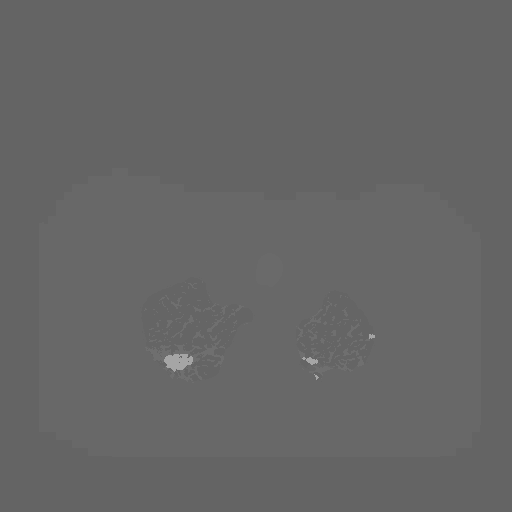
[frame 270/304  lung]
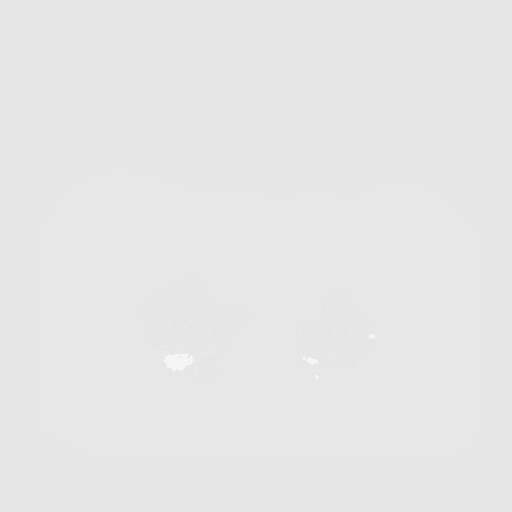
[frame 304/304  lung]
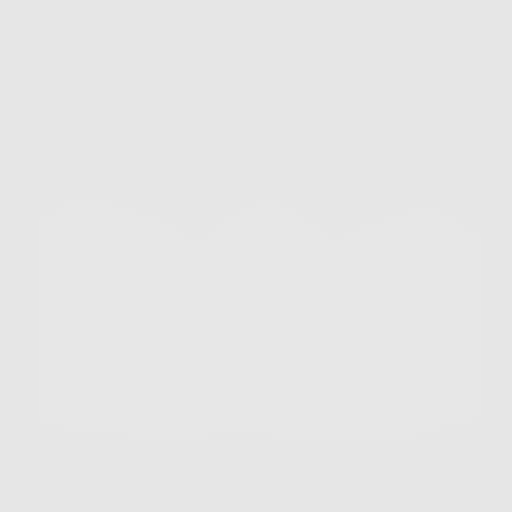

[10 of 10 positions shown; findings below may reference images not displayed]

FINDINGS: Cardiovascular: The heart size is normal. No substantial pericardial
effusion. Coronary artery calcification is evident. Atherosclerotic
calcification is noted in the wall of the thoracic aorta.

Mediastinum/Nodes: No mediastinal lymphadenopathy. No evidence for
gross hilar lymphadenopathy although assessment is limited by the
lack of intravenous contrast on today's study. The esophagus has
normal imaging features. There is no axillary lymphadenopathy.

Lungs/Pleura: Similar appearance biapical pleuroparenchymal
scarring. Centrilobular and paraseptal emphysema evident. Numerous
new bilateral pulmonary nodules are identified. Areas of clustered
tree-in-bud nodularity are noted in the right middle lobe, left
upper lobe, and left lower lobe. Dominant nodule today is an 11.5 mm
right middle lobe nodule (image 164/series 3), adjacent to a region
of tree-in-bud opacity. No focal airspace consolidation. No pleural
effusion.

Upper Abdomen: Tiny calcified gallstones evident. 2.3 cm exophytic
lesion upper pole right kidney, minimally increased from 2.1 cm
previously.

Musculoskeletal: No worrisome lytic or sclerotic osseous
abnormality.
IMPRESSION: 1. Lung-RADS 4B, suspicious. New 11.5 mm right middle lobe nodule,
in somewhat close proximity to a focus of tree-in-bud nodularity.
While this may be related to infectious/inflammatory etiology,
neoplasm is certainly a concern. Additional imaging evaluation or
consultation with Pulmonology or Thoracic Surgery recommended.
2. Interval development of multiple scattered tiny bilateral
pulmonary nodules. Indeterminate. Close attention on follow-up
recommended.
3. New areas of focal tree-in-bud nodularity in both lungs. Imaging
features suggest infectious etiology with atypical infection
(including LOCKLEAR) a distinct consideration. Right 2.3 cm exophytic
kidney lesion has increased from upper pole 2.1 cm on the 05/15/2018
exam. This lesion was not visible on the 6523 study. Attention on
follow-up recommended.
4. Cholelithiasis.
5.  Emphysema (E3ZET-S9Q.8) and Aortic Atherosclerosis (E3ZET-170.0)

These results will be called to the ordering clinician or
representative by the Radiologist Assistant, and communication
documented in the PACS or [REDACTED].

## 2021-12-07 ENCOUNTER — Other Ambulatory Visit: Payer: Self-pay | Admitting: Medical

## 2021-12-07 NOTE — Telephone Encounter (Signed)
Requesting: clonazepam 0.5mg   Contract: 04/29/21 UDS: 01/27/21 Last Visit: 07/15/21 Next Visit: None Last Refill: 11/15/21 #15 and 0RF  Please Advise  Rx refill sent. Please have pt to follow up next month for controlled med visit.

## 2021-12-26 ENCOUNTER — Other Ambulatory Visit: Payer: Self-pay | Admitting: Medical

## 2021-12-26 NOTE — Telephone Encounter (Addendum)
Requesting: clonazepam 0.5mg   Contract: 04/29/21 UDS: 01/27/21 Last Visit: 07/15/21 Next Visit: None Last Refill: 12/08/21 #15 and 0RF  Please Advise  Rx sent to pharmacy. Please get him scheduled for follow up with me in 2 weeks.  Thanks,

## 2021-12-27 ENCOUNTER — Ambulatory Visit (INDEPENDENT_AMBULATORY_CARE_PROVIDER_SITE_OTHER): Payer: Medicare Other | Admitting: Medical

## 2021-12-27 ENCOUNTER — Ambulatory Visit (HOSPITAL_BASED_OUTPATIENT_CLINIC_OR_DEPARTMENT_OTHER)
Admission: RE | Admit: 2021-12-27 | Discharge: 2021-12-27 | Disposition: A | Discharge status: Home / Self Care | Payer: Medicare Other | Source: Ambulatory Visit | Attending: Medical | Admitting: Medical

## 2021-12-27 VITALS — BP 110/70 | HR 58 | Temp 98.0°F | Resp 18 | Ht 66.0 in | Wt 188.0 lb

## 2021-12-27 DIAGNOSIS — M542 Cervicalgia: Secondary | ICD-10-CM | POA: Insufficient documentation

## 2021-12-27 DIAGNOSIS — L089 Local infection of the skin and subcutaneous tissue, unspecified: Secondary | ICD-10-CM

## 2021-12-27 DIAGNOSIS — Z79899 Other long term (current) drug therapy: Secondary | ICD-10-CM | POA: Diagnosis not present

## 2021-12-27 DIAGNOSIS — F419 Anxiety disorder, unspecified: Secondary | ICD-10-CM | POA: Diagnosis not present

## 2021-12-27 DIAGNOSIS — G4489 Other headache syndrome: Secondary | ICD-10-CM

## 2021-12-27 DIAGNOSIS — R5383 Other fatigue: Secondary | ICD-10-CM | POA: Diagnosis not present

## 2021-12-27 LAB — CBC WITH DIFFERENTIAL/PLATELET
Basophils Absolute: 0 10*3/uL (ref 0.0–0.1)
Basophils Relative: 0.8 % (ref 0.0–3.0)
Eosinophils Absolute: 0.5 10*3/uL (ref 0.0–0.7)
Eosinophils Relative: 7.7 % — ABNORMAL HIGH (ref 0.0–5.0)
HCT: 37.1 % — ABNORMAL LOW (ref 39.0–52.0)
Hemoglobin: 12.6 g/dL — ABNORMAL LOW (ref 13.0–17.0)
Lymphocytes Relative: 15 % (ref 12.0–46.0)
Lymphs Abs: 1 10*3/uL (ref 0.7–4.0)
MCHC: 33.8 g/dL (ref 30.0–36.0)
MCV: 90.7 fl (ref 78.0–100.0)
Monocytes Absolute: 0.5 10*3/uL (ref 0.1–1.0)
Monocytes Relative: 7.4 % (ref 3.0–12.0)
Neutro Abs: 4.5 10*3/uL (ref 1.4–7.7)
Neutrophils Relative %: 69.1 % (ref 43.0–77.0)
Platelets: 284 10*3/uL (ref 150.0–400.0)
RBC: 4.09 Mil/uL — ABNORMAL LOW (ref 4.22–5.81)
RDW: 13.1 % (ref 11.5–15.5)
WBC: 6.5 10*3/uL (ref 4.0–10.5)

## 2021-12-27 LAB — TSH: TSH: 0.24 u[IU]/mL — ABNORMAL LOW (ref 0.35–5.50)

## 2021-12-27 LAB — T4, FREE: Free T4: 1.01 ng/dL (ref 0.60–1.60)

## 2021-12-27 MED ORDER — DOXYCYCLINE HYCLATE 100 MG PO TABS: 100.0000 mg | ORAL_TABLET | Freq: Two times a day (BID) | ORAL | 0 refills | Status: DC

## 2021-12-27 MED ORDER — CYCLOBENZAPRINE HCL 5 MG PO TABS: ORAL_TABLET | ORAL | 0 refills | Status: DC

## 2021-12-27 NOTE — Progress Notes (Signed)
Subjective:    Patient ID: Patrick Morris, male    DOB: 1950/10/13, 71 y.o.   MRN: [id number]  HPI Pt in with recent ha for 5-6 days. He states back of his neck has been hurting. No fall or trauma to head. Pt states feels weak. He has occipital region ha. Pt states no history of any chronic ha. Only rare, brief one day ha on occasion/self limited.  Pt just got flu vaccine and rsv vaccine yesterday.    Nose has been mild red recently for one month. Occasional sore at tip of nose.  Anxiety- refilled his clonazepam yesterday. Will get updated uds and contract today.   Fatigue over past week.   Review of Systems  Constitutional:  Positive for fatigue. Negative for chills and fever.  HENT:  Negative for congestion.   Respiratory:  Negative for cough, chest tightness, wheezing and stridor.   Cardiovascular:  Negative for chest pain and palpitations.  Gastrointestinal:  Negative for abdominal pain.  Musculoskeletal:  Negative for back pain.  Skin:  Positive for rash.       Redness to nose.  Neurological:  Negative for dizziness, weakness and light-headedness.  Hematological:  Negative for adenopathy.  Psychiatric/Behavioral:  Negative for behavioral problems, decreased concentration, dysphoric mood, sleep disturbance and suicidal ideas. The patient is nervous/anxious.     Past Medical History:  Diagnosis Date   Atypical atrial flutter (HCC)    BPH (benign prostatic hyperplasia)    his specialist is recommending surgery.   Cataract    Chronic anticoagulation    Colitis    COPD (chronic obstructive pulmonary disease) (HCC)    COPD exacerbation (HCC) 01/22/2014   Coronary artery calcification    Ear infection    GERD (gastroesophageal reflux disease)    Hard of hearing    Salmonella    Sinus infection      Social History   Socioeconomic History   Marital status: Married    Spouse name: Not on file   Number of children: Not on file   Years of education: Not on file    Highest education level: Not on file  Occupational History   Not on file  Tobacco Use   Smoking status: Former    Packs/day: 1.00    Years: 55.00    Total pack years: 55.00    Types: Cigarettes   Smokeless tobacco: Never   Tobacco comments:    down to 0.5ppd  Vaping Use   Vaping Use: Never used  Substance and Sexual Activity   Alcohol use: Never   Drug use: No   Sexual activity: Not Currently    Birth control/protection: None  Other Topics Concern   Not on file  Social History Narrative   Not on file   Social Determinants of Health   Financial Resource Strain: Low Risk  (04/11/2021)   Overall Financial Resource Strain (CARDIA)    Difficulty of Paying Living Expenses: Not hard at all  Food Insecurity: No Food Insecurity (04/11/2021)   Hunger Vital Sign    Worried About Running Out of Food in the Last Year: Never true    Ran Out of Food in the Last Year: Never true  Transportation Needs: No Transportation Needs (04/11/2021)   PRAPARE - Hydrologist (Medical): No    Lack of Transportation (Non-Medical): No  Physical Activity: Inactive (04/11/2021)   Exercise Vital Sign    Days of Exercise per Week: 0 days  Minutes of Exercise per Session: 0 min  Stress: No Stress Concern Present (04/11/2021)   [place]    Feeling of Stress : Not at all  Social Connections: Moderately Isolated (04/11/2021)   Social Connection and Isolation Panel [NHANES]    Frequency of Communication with Friends and Family: More than three times a week    Frequency of Social Gatherings with Friends and Family: More than three times a week    Attends Religious Services: Never    Marine scientist or Organizations: No    Attends Archivist Meetings: Never    Marital Status: Married  Human resources officer Violence: Not on file    Past Surgical History:  Procedure Laterality Date   BRONCHIAL  BIOPSY  08/10/2020   Procedure: BRONCHIAL BIOPSIES;  Surgeon: Garner Nash, DO;  Location: [facility];  Service: Pulmonary;;   BRONCHIAL BRUSHINGS  08/10/2020   Procedure: BRONCHIAL BRUSHINGS;  Surgeon: Garner Nash, DO;  Location: [facility];  Service: Pulmonary;;   BRONCHIAL NEEDLE ASPIRATION BIOPSY  08/10/2020   Procedure: BRONCHIAL NEEDLE ASPIRATION BIOPSIES;  Surgeon: Garner Nash, DO;  Location: [facility];  Service: Pulmonary;;   BRONCHIAL WASHINGS  08/10/2020   Procedure: BRONCHIAL WASHINGS;  Surgeon: Garner Nash, DO;  Location: [facility];  Service: Pulmonary;;   COLONOSCOPY     ENDOBRONCHIAL ULTRASOUND  08/10/2020   Procedure: ENDOBRONCHIAL ULTRASOUND;  Surgeon: Garner Nash, DO;  Location: [facility];  Service: Pulmonary;;   EYE SURGERY Right    FIDUCIAL MARKER PLACEMENT  08/10/2020   Procedure: FIDUCIAL MARKER PLACEMENT;  Surgeon: Garner Nash, DO;  Location: [facility];  Service: Pulmonary;;   IR RADIOLOGIST EVAL & MGMT  05/07/2019   IR RADIOLOGIST EVAL & MGMT  05/27/2019   IR RADIOLOGIST EVAL & MGMT  10/23/2019   IR RADIOLOGIST EVAL & MGMT  01/15/2020   VIDEO BRONCHOSCOPY WITH ENDOBRONCHIAL NAVIGATION Right 08/10/2020   Procedure: VIDEO BRONCHOSCOPY WITH ENDOBRONCHIAL NAVIGATION;  Surgeon: Garner Nash, DO;  Location: [facility];  Service: Pulmonary;  Laterality: Right;    Family History  Problem Relation Age of Onset   Hyperlipidemia Father    Stroke Father    Alcohol abuse Father    Throat cancer Brother    Lymphoma Brother     Allergies  Allergen Reactions   Aspirin Other (See Comments)    Acid reflux and drooling    Current Outpatient Medications on File Prior to Visit  Medication Sig Dispense Refill   acetaminophen (TYLENOL) 500 MG tablet Take 1,000 mg by mouth every 6 (six) hours as needed for mild pain or moderate pain. For pain     amoxicillin-clavulanate (AUGMENTIN) 875-125 MG tablet Take 1 tablet by mouth 2 (two) times  daily. 20 tablet 0   busPIRone (BUSPAR) 15 MG tablet Take 1 tablet by mouth twice daily 60 tablet 0   clonazePAM (KLONOPIN) 0.5 MG tablet TAKE 1/2 TO 1 TABLET BY MOUTH ONCE DAILY AS NEEDED FOR ANXIETY 15 tablet 0   doxazosin (CARDURA) 4 MG tablet Take 4 mg by mouth at bedtime.     finasteride (PROSCAR) 5 MG tablet Take 1 tablet (5 mg total) by mouth at bedtime. 30 tablet 2   fluticasone (FLONASE) 50 MCG/ACT nasal spray Place 2 sprays into both nostrils daily. 16 g 1   fluticasone-salmeterol (ADVAIR) 250-50 MCG/ACT AEPB INHALE 1 DOSE BY MOUTH IN THE MORNING AND AT BEDTIME 180 each 0  metoprolol tartrate (LOPRESSOR) 25 MG tablet Take 1 tablet by mouth twice daily 180 tablet 1   nystatin-triamcinolone (MYCOLOG II) cream Apply 1 application topically 2 (two) times daily. 30 g 0   No current facility-administered medications on file prior to visit.    BP 110/70   Pulse (!) 58   Temp 98 F (36.7 C)   Resp 18   Ht 5\' 6"  (1.676 m)   Wt 188 lb (85.3 kg)   SpO2 98%   BMI 30.34 kg/m        Objective:   Physical Exam  General Mental Status- Alert. General Appearance- Not in acute distress.   Skin General: Color- Normal Color. Moisture- Normal Moisture.  Neck Bilateral trapezus tenderness to palpation.  Chest and Lung Exam Auscultation: Breath Sounds:-Normal.  Cardiovascular Auscultation:Rythm- Regular. Murmurs & Other Heart Sounds:Auscultation of the heart reveals- No Murmurs.  Abdomen Inspection:-Inspeection Normal. Palpation/Percussion:Note:No mass. Palpation and Percussion of the abdomen reveal- Non Tender, Non Distended + BS, no rebound or guarding.    Neurologic Cranial Nerve exam:- CN III-XII intact(No nystagmus), symmetric smile. Drift Test:- No drift. Romberg Exam:- Negative.  Heal to Toe Gait exam:-poor heel to toe gain. Finger to Nose:- Normal/Intact Strength:- 5/5 equal and symmetric strength both upper and lower extremities.       Assessment & Plan:    Patient Instructions  New moderate to severe ha for 5-6 day. Past 83 yrs of age with no prior ha hx. Will place ct head order without contrast and try to get prior authorized.  Neck pain with trapezius tenderness. Will get c spine xray to severe if degenerative changes present. Can use flexeril low dose 5 mg daily for next 3 night and can use in combination with tylenol.  For anxiety- refilled clonazepam. Update you contract and get uds.  For red nose possible skin infection rx doxycycline.   For fatigue get cbc, cmp, tsh and t4.  Follow up in 2 weeks or sooner if needed.   Mackie Pai, PA-C

## 2021-12-27 NOTE — Patient Instructions (Addendum)
New moderate to severe ha for 5-6 day. Past 46 yrs of age with no prior ha hx. Will place ct head order without contrast and try to get prior authorized.  Neck pain with trapezius tenderness. Will get c spine xray to severe if degenerative changes present. Can use flexeril low dose 5 mg daily for next 3 night and can use in combination with tylenol.  For anxiety- refilled clonazepam. Update you contract and get uds.  For red nose possible skin infection rx doxycycline.   For fatigue get cbc, cmp, tsh and t4.  Follow up in 2 weeks or sooner if needed.

## 2021-12-28 LAB — COMPREHENSIVE METABOLIC PANEL
ALT: 8 U/L (ref 0–53)
AST: 9 U/L (ref 0–37)
Albumin: 3.9 g/dL (ref 3.5–5.2)
Alkaline Phosphatase: 103 U/L (ref 39–117)
BUN: 13 mg/dL (ref 6–23)
CO2: 25 mEq/L (ref 19–32)
Calcium: 8.6 mg/dL (ref 8.4–10.5)
Chloride: 105 mEq/L (ref 96–112)
Creatinine, Ser: 0.76 mg/dL (ref 0.40–1.50)
GFR: 89.47 mL/min (ref >60.00)
Glucose, Bld: 93 mg/dL (ref 70–99)
Potassium: 4.1 mEq/L (ref 3.5–5.1)
Sodium: 139 mEq/L (ref 135–145)
Total Bilirubin: 0.8 mg/dL (ref 0.2–1.2)
Total Protein: 7 g/dL (ref 6.0–8.3)

## 2021-12-29 LAB — DRUG MONITORING PANEL 376104, URINE
Alphahydroxyalprazolam: NEGATIVE ng/mL (ref <25)
Alphahydroxymidazolam: NEGATIVE ng/mL (ref <50)
Alphahydroxytriazolam: NEGATIVE ng/mL (ref <50)
Aminoclonazepam: 124 ng/mL — ABNORMAL HIGH (ref <25)
Amphetamines: NEGATIVE ng/mL (ref <500)
Barbiturates: NEGATIVE ng/mL (ref <300)
Benzodiazepines: POSITIVE ng/mL — AB (ref <100)
Cocaine Metabolite: NEGATIVE ng/mL (ref <150)
Desmethyltramadol: NEGATIVE ng/mL (ref <100)
Hydroxyethylflurazepam: NEGATIVE ng/mL (ref <50)
Lorazepam: NEGATIVE ng/mL (ref <50)
Nordiazepam: NEGATIVE ng/mL (ref <50)
Opiates: NEGATIVE ng/mL (ref <100)
Oxazepam: NEGATIVE ng/mL (ref <50)
Oxycodone: NEGATIVE ng/mL (ref <100)
Temazepam: NEGATIVE ng/mL (ref <50)
Tramadol: NEGATIVE ng/mL (ref <100)

## 2021-12-29 LAB — DM TEMPLATE

## 2022-01-09 ENCOUNTER — Telehealth: Payer: Self-pay | Admitting: Medical

## 2022-01-09 ENCOUNTER — Other Ambulatory Visit: Payer: Self-pay | Admitting: Medical

## 2022-01-09 NOTE — Telephone Encounter (Signed)
Appt tomorrow.

## 2022-01-09 NOTE — Telephone Encounter (Signed)
I have a medical clearance request form for dental extraction.  Patient has some comorbid conditions.  Since dentist is requiring me to sign off for medical clearance would ask that patient be scheduled this Wednesday or Thursday for physical exam.  He has some comorbidities so think is best during exam, listen to his heart and to check his blood pressure before procedure.

## 2022-01-09 NOTE — Progress Notes (Deleted)
Cardiology Office Note:    Date:  01/09/2022   ID:  Patrick Morris, DOB [birth year], MRN [id number]  PCP:  Mackie Pai, PA-C  Cardiologist:  Shirlee More, MD    Referring MD: Mackie Pai, PA-C    ASSESSMENT:    No diagnosis found. PLAN:    In order of problems listed above:  ***   Next appointment: ***   Medication Adjustments/Labs and Tests Ordered: Current medicines are reviewed at length with the patient today.  Concerns regarding medicines are outlined above.  No orders of the defined types were placed in this encounter.  No orders of the defined types were placed in this encounter.   No chief complaint on file.      Patrick Morris is a 71 y.o. male with a hx of atypical atrial flutter chronic anticoagulation and coronary artery calcification on CT scan without evidence of ischemia on myocardial perfusion imaging last seen 10/08/2020. Compliance with diet, lifestyle and medications: *** Past Medical History:  Diagnosis Date   Atypical atrial flutter (HCC)    BPH (benign prostatic hyperplasia)    his specialist is recommending surgery.   Cataract    Chronic anticoagulation    Colitis    COPD (chronic obstructive pulmonary disease) (HCC)    COPD exacerbation (HCC) 01/22/2014   Coronary artery calcification    Ear infection    GERD (gastroesophageal reflux disease)    Hard of hearing    Salmonella    Sinus infection     Past Surgical History:  Procedure Laterality Date   BRONCHIAL BIOPSY  08/10/2020   Procedure: BRONCHIAL BIOPSIES;  Surgeon: Garner Nash, DO;  Location: [facility];  Service: Pulmonary;;   BRONCHIAL BRUSHINGS  08/10/2020   Procedure: BRONCHIAL BRUSHINGS;  Surgeon: Garner Nash, DO;  Location: [facility];  Service: Pulmonary;;   BRONCHIAL NEEDLE ASPIRATION BIOPSY  08/10/2020   Procedure: BRONCHIAL NEEDLE ASPIRATION BIOPSIES;  Surgeon: Garner Nash, DO;  Location: [facility];  Service: Pulmonary;;   BRONCHIAL WASHINGS   08/10/2020   Procedure: BRONCHIAL WASHINGS;  Surgeon: Garner Nash, DO;  Location: [place];  Service: Pulmonary;;   COLONOSCOPY     ENDOBRONCHIAL ULTRASOUND  08/10/2020   Procedure: ENDOBRONCHIAL ULTRASOUND;  Surgeon: Garner Nash, DO;  Location: [facility];  Service: Pulmonary;;   EYE SURGERY Right    FIDUCIAL MARKER PLACEMENT  08/10/2020   Procedure: FIDUCIAL MARKER PLACEMENT;  Surgeon: Garner Nash, DO;  Location: [facility];  Service: Pulmonary;;   IR RADIOLOGIST EVAL & MGMT  05/07/2019   IR RADIOLOGIST EVAL & MGMT  05/27/2019   IR RADIOLOGIST EVAL & MGMT  10/23/2019   IR RADIOLOGIST EVAL & MGMT  01/15/2020   VIDEO BRONCHOSCOPY WITH ENDOBRONCHIAL NAVIGATION Right 08/10/2020   Procedure: VIDEO BRONCHOSCOPY WITH ENDOBRONCHIAL NAVIGATION;  Surgeon: Garner Nash, DO;  Location: [facility];  Service: Pulmonary;  Laterality: Right;    Current Medications: No outpatient medications have been marked as taking for the 01/10/22 encounter (Appointment) with Richardo Priest, MD.     Allergies:   Aspirin   Social History   Socioeconomic History   Marital status: Married    Spouse name: Not on file   Number of children: Not on file   Years of education: Not on file   Highest education level: Not on file  Occupational History   Not on file  Tobacco Use   Smoking status: Former    Packs/day: 1.00    Years: 55.00  Total pack years: 55.00    Types: Cigarettes   Smokeless tobacco: Never   Tobacco comments:    down to 0.5ppd  Vaping Use   Vaping Use: Never used  Substance and Sexual Activity   Alcohol use: Never   Drug use: No   Sexual activity: Not Currently    Birth control/protection: None  Other Topics Concern   Not on file  Social History Narrative   Not on file   Social Determinants of Health   Financial Resource Strain: Low Risk  (04/11/2021)   Overall Financial Resource Strain (CARDIA)    Difficulty of Paying Living Expenses: Not hard at all  Food  Insecurity: No Food Insecurity (04/11/2021)   Hunger Vital Sign    Worried About Running Out of Food in the Last Year: Never true    Ran Out of Food in the Last Year: Never true  Transportation Needs: No Transportation Needs (04/11/2021)   PRAPARE - Hydrologist (Medical): No    Lack of Transportation (Non-Medical): No  Physical Activity: Inactive (04/11/2021)   Exercise Vital Sign    Days of Exercise per Week: 0 days    Minutes of Exercise per Session: 0 min  Stress: No Stress Concern Present (04/11/2021)   [place]    Feeling of Stress : Not at all  Social Connections: Moderately Isolated (04/11/2021)   Social Connection and Isolation Panel [NHANES]    Frequency of Communication with Friends and Family: More than three times a week    Frequency of Social Gatherings with Friends and Family: More than three times a week    Attends Religious Services: Never    Marine scientist or Organizations: No    Attends Music therapist: Never    Marital Status: Married     Family History: The patient's ***family history includes Alcohol abuse in his father; Hyperlipidemia in his father; Lymphoma in his brother; Stroke in his father; Throat cancer in his brother. ROS:   Please see the history of present illness.    All other systems reviewed and are negative.  EKGs/Labs/Other Studies Reviewed:    The following studies were reviewed today:  EKG:  EKG ordered today and personally reviewed.  The ekg ordered today demonstrates ***  Recent Labs: 12/27/2021: ALT 8; BUN 13; Creatinine, Ser 0.76; Hemoglobin 12.6; Platelets 284.0; Potassium 4.1; Sodium 139; TSH 0.24  Recent Lipid Panel    Component Value Date/Time   CHOL 112 09/11/2017 0843   TRIG 81.0 09/11/2017 0843   HDL 26.80 (L) 09/11/2017 0843   CHOLHDL 4 09/11/2017 0843   VLDL 16.2 09/11/2017 0843   LDLCALC 69 09/11/2017  0843    Physical Exam:    VS:  There were no vitals taken for this visit.    Wt Readings from Last 3 Encounters:  12/27/21 188 lb (85.3 kg)  10/18/21 188 lb (85.3 kg)  07/15/21 189 lb 6.4 oz (85.9 kg)     GEN: *** Well nourished, well developed in no acute distress HEENT: Normal NECK: No JVD; No carotid bruits LYMPHATICS: No lymphadenopathy CARDIAC: ***RRR, no murmurs, rubs, gallops RESPIRATORY:  Clear to auscultation without rales, wheezing or rhonchi  ABDOMEN: Soft, non-tender, non-distended MUSCULOSKELETAL:  No edema; No deformity  SKIN: Warm and dry NEUROLOGIC:  Alert and oriented x 3 PSYCHIATRIC:  Normal affect    Signed, Shirlee More, MD  01/09/2022 2:56 PM    [facility]

## 2022-01-10 ENCOUNTER — Ambulatory Visit (INDEPENDENT_AMBULATORY_CARE_PROVIDER_SITE_OTHER): Payer: Medicare Other | Admitting: Medical

## 2022-01-10 ENCOUNTER — Ambulatory Visit: Payer: Medicare Other | Admitting: Cardiology

## 2022-01-10 VITALS — BP 104/65 | HR 114 | Temp 97.8°F | Resp 18 | Ht 66.0 in | Wt 187.0 lb

## 2022-01-10 DIAGNOSIS — Z01818 Encounter for other preprocedural examination: Secondary | ICD-10-CM

## 2022-01-10 DIAGNOSIS — M5410 Radiculopathy, site unspecified: Secondary | ICD-10-CM | POA: Diagnosis not present

## 2022-01-10 DIAGNOSIS — M542 Cervicalgia: Secondary | ICD-10-CM | POA: Diagnosis not present

## 2022-01-10 MED ORDER — GABAPENTIN 100 MG PO CAPS: 100.0000 mg | ORAL_CAPSULE | Freq: Three times a day (TID) | ORAL | 0 refills | Status: DC

## 2022-01-10 MED ORDER — CLONAZEPAM 0.5 MG PO TABS: ORAL_TABLET | ORAL | 0 refills | Status: DC

## 2022-01-10 NOTE — Telephone Encounter (Signed)
Requesting: clonazepam 0.5mg   Contract: 04/29/21 UDS: 12/27/21 Last Visit: 01/10/22 Next Visit: None Last Refill: 12/26/21 #15 and 0RF  Please Advise

## 2022-01-10 NOTE — Patient Instructions (Addendum)
For neck pain and intermittent radicular pain will rx gabapentin 100 mg tid. But start slow and use 1 tab at night first week, 2nd week twice daily and 3rd week three times daily. Not to use flexeril presently.    For upcoming dental procedure I want to review cardiologist note. If they do ekg and find flutter they may advise you on restarting blood thinner. Please show cardiologist copy of dentist.   On review no echo seen in epic and no known valve abnormality or joint replacement. Presently I don't see indication for antibiotic prophylaxis.   If do clear for surgery will mention/suggest doing staged procedure rather than pulling all teeth at one time  For anxiety refilling xanax. Rx advisement given.  Follow up in 1 month or sooner if needed.

## 2022-01-10 NOTE — Telephone Encounter (Signed)
Called pt Lvm to call our office back to get appt  For wed or Thur to get for Cpe and to get paperwork complete

## 2022-01-10 NOTE — Progress Notes (Signed)
Subjective:    Patient ID: Patrick Morris, male    DOB: 09/18/1950, 71 y.o.   MRN: [id number]  HPI  Pt in for follow up.  Appointment upcoming for teeth extraction later this month. Sheet states just local anesthesia with epinephrine. Wife states may have to pull 10 teeth though unclear how many will be pulled at one time.  I have med clearance form. No orthopedic procedures done before. No joint replacements.  Pt has some atrial flutter in past. Was brief.  Last cardiology note was below. Fortunately is in sinus rhythm no longer anticoagulated I would not resume an anticoagulant unless he has clinical recurrence. Stable he had no ischemia on perfusion imaging not having chest pain and I would not repeat an evaluation at this time  On review I don't see echo. No known valbe abnormalities.    Pt seen for neck pain recently. He had recent xray that showed below. FINDINGS: AP, lateral, bilateral oblique, open-mouth and swimmer's views of the cervical spine were obtained. There is minimal anterolisthesis seen at C4-C5. Mild cervical spondylosis with mildly prominent endplate osteophytes. Mild narrowing of the disc space at C5-C6. No other significant bone abnormalities are identified.   IMPRESSION: 1. Mild cervical spondylosis with mildly prominent endplate osteophytes.   2.  Minimal anterolisthesis at C4-C5.  Occasional pain from neck that runs down his left arm. No associated cardiac symptoms.  Last visit plan below   "Neck pain with trapezius tenderness. Will get c spine xray to severe if degenerative changes present. Can use flexeril low dose 5 mg daily for next 3 night and can use in combination with tylenol."  Anxiety- pt is on clonazepam.   Review of Systems  Constitutional:  Negative for chills and fatigue.  Respiratory:  Negative for cough, chest tightness, shortness of breath and wheezing.   Cardiovascular:  Negative for chest pain and palpitations.   Gastrointestinal:  Negative for abdominal distention and anal bleeding.  Genitourinary:  Negative for dysuria.  Musculoskeletal:  Positive for neck pain.  Skin:  Negative for rash.  Neurological:  Negative for dizziness, tremors, light-headedness and numbness.  Hematological:  Negative for adenopathy. Does not bruise/bleed easily.  Psychiatric/Behavioral:  Negative for behavioral problems.    Past Medical History:  Diagnosis Date   Atypical atrial flutter (HCC)    BPH (benign prostatic hyperplasia)    his specialist is recommending surgery.   Cataract    Chronic anticoagulation    Colitis    COPD (chronic obstructive pulmonary disease) (HCC)    COPD exacerbation (HCC) 01/22/2014   Coronary artery calcification    Ear infection    GERD (gastroesophageal reflux disease)    Hard of hearing    Salmonella    Sinus infection      Social History   Socioeconomic History   Marital status: Married    Spouse name: Not on file   Number of children: Not on file   Years of education: Not on file   Highest education level: Not on file  Occupational History   Not on file  Tobacco Use   Smoking status: Former    Packs/day: 1.00    Years: 55.00    Total pack years: 55.00    Types: Cigarettes   Smokeless tobacco: Never   Tobacco comments:    down to 0.5ppd  Vaping Use   Vaping Use: Never used  Substance and Sexual Activity   Alcohol use: Never   Drug use: No  Sexual activity: Not Currently    Birth control/protection: None  Other Topics Concern   Not on file  Social History Narrative   Not on file   Social Determinants of Health   Financial Resource Strain: Low Risk  (04/11/2021)   Overall Financial Resource Strain (CARDIA)    Difficulty of Paying Living Expenses: Not hard at all  Food Insecurity: No Food Insecurity (04/11/2021)   Hunger Vital Sign    Worried About Running Out of Food in the Last Year: Never true    Ran Out of Food in the Last Year: Never true   Transportation Needs: No Transportation Needs (04/11/2021)   PRAPARE - Hydrologist (Medical): No    Lack of Transportation (Non-Medical): No  Physical Activity: Inactive (04/11/2021)   Exercise Vital Sign    Days of Exercise per Week: 0 days    Minutes of Exercise per Session: 0 min  Stress: No Stress Concern Present (04/11/2021)   [place]    Feeling of Stress : Not at all  Social Connections: Moderately Isolated (04/11/2021)   Social Connection and Isolation Panel [NHANES]    Frequency of Communication with Friends and Family: More than three times a week    Frequency of Social Gatherings with Friends and Family: More than three times a week    Attends Religious Services: Never    Marine scientist or Organizations: No    Attends Archivist Meetings: Never    Marital Status: Married  Human resources officer Violence: Not on file    Past Surgical History:  Procedure Laterality Date   BRONCHIAL BIOPSY  08/10/2020   Procedure: BRONCHIAL BIOPSIES;  Surgeon: Garner Nash, DO;  Location: [facility];  Service: Pulmonary;;   BRONCHIAL BRUSHINGS  08/10/2020   Procedure: BRONCHIAL BRUSHINGS;  Surgeon: Garner Nash, DO;  Location: [facility];  Service: Pulmonary;;   BRONCHIAL NEEDLE ASPIRATION BIOPSY  08/10/2020   Procedure: BRONCHIAL NEEDLE ASPIRATION BIOPSIES;  Surgeon: Garner Nash, DO;  Location: [facility];  Service: Pulmonary;;   BRONCHIAL WASHINGS  08/10/2020   Procedure: BRONCHIAL WASHINGS;  Surgeon: Garner Nash, DO;  Location: [place] [facility];  Service: Pulmonary;;   COLONOSCOPY     ENDOBRONCHIAL ULTRASOUND  08/10/2020   Procedure: ENDOBRONCHIAL ULTRASOUND;  Surgeon: Garner Nash, DO;  Location: [facility];  Service: Pulmonary;;   EYE SURGERY Right    FIDUCIAL MARKER PLACEMENT  08/10/2020   Procedure: FIDUCIAL MARKER PLACEMENT;  Surgeon: Garner Nash,  DO;  Location: [facility];  Service: Pulmonary;;   IR RADIOLOGIST EVAL & MGMT  05/07/2019   IR RADIOLOGIST EVAL & MGMT  05/27/2019   IR RADIOLOGIST EVAL & MGMT  10/23/2019   IR RADIOLOGIST EVAL & MGMT  01/15/2020   VIDEO BRONCHOSCOPY WITH ENDOBRONCHIAL NAVIGATION Right 08/10/2020   Procedure: VIDEO BRONCHOSCOPY WITH ENDOBRONCHIAL NAVIGATION;  Surgeon: Garner Nash, DO;  Location: [facility];  Service: Pulmonary;  Laterality: Right;    Family History  Problem Relation Age of Onset   Hyperlipidemia Father    Stroke Father    Alcohol abuse Father    Throat cancer Brother    Lymphoma Brother     Allergies  Allergen Reactions   Aspirin Other (See Comments)    Acid reflux and drooling    Current Outpatient Medications on File Prior to Visit  Medication Sig Dispense Refill   acetaminophen (TYLENOL) 500 MG tablet Take 1,000 mg  by mouth every 6 (six) hours as needed for mild pain or moderate pain. For pain     busPIRone (BUSPAR) 15 MG tablet Take 1 tablet by mouth twice daily 60 tablet 0   clonazePAM (KLONOPIN) 0.5 MG tablet TAKE 1/2 TO 1 TABLET BY MOUTH ONCE DAILY AS NEEDED FOR ANXIETY 15 tablet 0   cyclobenzaprine (FLEXERIL) 5 MG tablet 1 tab po  prn neck pain at night  for next 3 nights 30 tablet 0   doxazosin (CARDURA) 4 MG tablet Take 4 mg by mouth at bedtime.     finasteride (PROSCAR) 5 MG tablet Take 1 tablet (5 mg total) by mouth at bedtime. 30 tablet 2   fluticasone (FLONASE) 50 MCG/ACT nasal spray Place 2 sprays into both nostrils daily. 16 g 1   fluticasone-salmeterol (ADVAIR) 250-50 MCG/ACT AEPB INHALE 1 DOSE BY MOUTH IN THE MORNING AND AT BEDTIME 180 each 0   metoprolol tartrate (LOPRESSOR) 25 MG tablet Take 1 tablet by mouth twice daily 180 tablet 1   nystatin-triamcinolone (MYCOLOG II) cream Apply 1 application topically 2 (two) times daily. 30 g 0   No current facility-administered medications on file prior to visit.    BP 104/65 (BP Location: Left Arm, Patient  Position: Sitting, Cuff Size: Normal)   Pulse (!) 114   Temp 97.8 F (36.6 C) (Temporal)   Resp 18   Ht 5\' 6"  (1.676 m)   Wt 187 lb (84.8 kg)   SpO2 99%   BMI 30.18 kg/m       Objective:   Physical Exam  General- No acute distress. Pleasant patient. Neck- Full range of motion, no jvd Lungs- Clear, even and unlabored. Heart- regular rate and rhythm. Neurologic- CNII- XII grossly intact.       Assessment & Plan:   Patient Instructions  For neck pain and intermittent radicular pain will rx gabapentin 100 mg tid. But start slow and use 1 tab at night first week, 2nd week twice daily and 3rd week three times daily. Not to use flexeril presently.    For upcoming dental procedure I want to review cardiologist note. If they do ekg and find flutter they may advise you on restarting blood thinner. Please show cardiologist copy of dentist.   On review no echo seen in epic and no known valve abnormality or joint replacement. Presently I don't see indication for antibiotic prophylaxis.   If do clear for surgery will mention/suggest doing staged procedure rather than pulling all teeth at one time  For anxiety refilling xanax. Rx advisement given.  Follow up in 1 month or sooner if needed.   Mackie Pai, PA-C

## 2022-01-12 ENCOUNTER — Telehealth (HOSPITAL_BASED_OUTPATIENT_CLINIC_OR_DEPARTMENT_OTHER): Payer: Self-pay

## 2022-01-26 ENCOUNTER — Other Ambulatory Visit: Payer: Self-pay | Admitting: Medical

## 2022-01-26 NOTE — Telephone Encounter (Addendum)
Requesting: klonopin Contract:04/29/2021 UDS:12/27/2021 Last Visit:12/27/2021 Next Visit:02/09/2022 Last Refill:01/10/2022  Please Advise   Rx refill sent to pharmacy.  Mackie Pai, PA-C

## 2022-02-09 ENCOUNTER — Other Ambulatory Visit: Payer: Self-pay | Admitting: Medical

## 2022-02-09 ENCOUNTER — Ambulatory Visit (INDEPENDENT_AMBULATORY_CARE_PROVIDER_SITE_OTHER): Payer: Medicare Other | Admitting: Medical

## 2022-02-09 VITALS — BP 110/70 | HR 100 | Temp 98.0°F | Resp 18 | Ht 66.0 in | Wt 185.2 lb

## 2022-02-09 DIAGNOSIS — M542 Cervicalgia: Secondary | ICD-10-CM | POA: Diagnosis not present

## 2022-02-09 DIAGNOSIS — D494 Neoplasm of unspecified behavior of bladder: Secondary | ICD-10-CM | POA: Diagnosis not present

## 2022-02-09 DIAGNOSIS — F419 Anxiety disorder, unspecified: Secondary | ICD-10-CM

## 2022-02-09 DIAGNOSIS — M5410 Radiculopathy, site unspecified: Secondary | ICD-10-CM

## 2022-02-09 MED ORDER — CLONAZEPAM 0.5 MG PO TABS: ORAL_TABLET | ORAL | 1 refills | Status: DC

## 2022-02-09 MED ORDER — GABAPENTIN 100 MG PO CAPS: 100.0000 mg | ORAL_CAPSULE | Freq: Three times a day (TID) | ORAL | 5 refills | Status: DC

## 2022-02-09 NOTE — Patient Instructions (Addendum)
Anxiety- controlled with xanax. Has up to date contract and uds. Will refill when do due with 30 tab rx per month.  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   Get cardiologist to fill out appropiate portions after potential ekg and echo done.   Glad to hear you continue not to smoke.  Degenerative changes to spine by xray and radicular pain improved with gabapentin 100 mg tid. Refill sent to pharmacy.  Follow up in 6 month or sooner if needed.

## 2022-02-09 NOTE — Progress Notes (Signed)
Subjective:    Patient ID: Patrick Morris, male    DOB: 1950-05-29, 71 y.o.   MRN: [id number]  HPI  Pt in for follow up on neck pain. He states pain in neck is less. Not as severe. He states pain radiating to upper extremity is much less controlled.  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 Surgery scheduled for Feb 20, 2022.  Pt has upcoming cardiologist appt in November.   Pt still taking xanax. He is up to date on contract and uds.  Pt stopped smoking more than a year ago.  Also scheduled for dental procedure.(Advised pt to show them dental clearance form).    Review of Systems  Constitutional:  Negative for chills, fatigue and fever.  HENT:  Negative for congestion, drooling and ear pain.   Respiratory:  Negative for cough, chest tightness, shortness of breath and wheezing.   Cardiovascular:  Negative for chest pain and palpitations.  Gastrointestinal:  Negative for abdominal pain, blood in stool and nausea.  Genitourinary:  Negative for dysuria, flank pain, frequency, hematuria and penile pain.  Musculoskeletal:  Positive for neck pain. Negative for back pain and gait problem.  Skin:  Negative for rash.  Neurological:  Negative for dizziness, light-headedness and headaches.  Hematological:  Negative for adenopathy. Does not bruise/bleed easily.  Psychiatric/Behavioral:  Negative for behavioral problems and decreased concentration.     Past Medical History:  Diagnosis Date   Atypical atrial flutter (HCC)    BPH (benign prostatic hyperplasia)    his specialist is recommending surgery.   Cataract    Chronic anticoagulation    Colitis    COPD (chronic obstructive pulmonary disease) (HCC)    COPD exacerbation (HCC) 01/22/2014   Coronary artery calcification    Ear infection    GERD (gastroesophageal reflux disease)    Hard of hearing    Salmonella    Sinus infection      Social History   Socioeconomic History   Marital status: Married    Spouse name: Not  on file   Number of children: Not on file   Years of education: Not on file   Highest education level: Not on file  Occupational History   Not on file  Tobacco Use   Smoking status: Former    Packs/day: 1.00    Years: 55.00    Total pack years: 55.00    Types: Cigarettes   Smokeless tobacco: Never   Tobacco comments:    down to 0.5ppd  Vaping Use   Vaping Use: Never used  Substance and Sexual Activity   Alcohol use: Never   Drug use: No   Sexual activity: Not Currently    Birth control/protection: None  Other Topics Concern   Not on file  Social History Narrative   Not on file   Social Determinants of Health   Financial Resource Strain: Low Risk  (04/11/2021)   Overall Financial Resource Strain (CARDIA)    Difficulty of Paying Living Expenses: Not hard at all  Food Insecurity: No Food Insecurity (04/11/2021)   Hunger Vital Sign    Worried About Running Out of Food in the Last Year: Never true    Ran Out of Food in the Last Year: Never true  Transportation Needs: No Transportation Needs (04/11/2021)   PRAPARE - Hydrologist (Medical): No    Lack of Transportation (Non-Medical): No  Physical Activity: Inactive (04/11/2021)   Exercise Vital Sign    Days  of Exercise per Week: 0 days    Minutes of Exercise per Session: 0 min  Stress: No Stress Concern Present (04/11/2021)   [place]    Feeling of Stress : Not at all  Social Connections: Moderately Isolated (04/11/2021)   Social Connection and Isolation Panel [NHANES]    Frequency of Communication with Friends and Family: More than three times a week    Frequency of Social Gatherings with Friends and Family: More than three times a week    Attends Religious Services: Never    Marine scientist or Organizations: No    Attends Archivist Meetings: Never    Marital Status: Married  Human resources officer Violence: Not  on file    Past Surgical History:  Procedure Laterality Date   BRONCHIAL BIOPSY  08/10/2020   Procedure: BRONCHIAL BIOPSIES;  Surgeon: Garner Nash, DO;  Location: [facility];  Service: Pulmonary;;   BRONCHIAL BRUSHINGS  08/10/2020   Procedure: BRONCHIAL BRUSHINGS;  Surgeon: Garner Nash, DO;  Location: [place];  Service: Pulmonary;;   BRONCHIAL NEEDLE ASPIRATION BIOPSY  08/10/2020   Procedure: BRONCHIAL NEEDLE ASPIRATION BIOPSIES;  Surgeon: Garner Nash, DO;  Location: [place];  Service: Pulmonary;;   BRONCHIAL WASHINGS  08/10/2020   Procedure: BRONCHIAL WASHINGS;  Surgeon: Garner Nash, DO;  Location: [place];  Service: Pulmonary;;   COLONOSCOPY     ENDOBRONCHIAL ULTRASOUND  08/10/2020   Procedure: ENDOBRONCHIAL ULTRASOUND;  Surgeon: Garner Nash, DO;  Location: [place];  Service: Pulmonary;;   EYE SURGERY Right    FIDUCIAL MARKER PLACEMENT  08/10/2020   Procedure: FIDUCIAL MARKER PLACEMENT;  Surgeon: Garner Nash, DO;  Location: [place];  Service: Pulmonary;;   IR RADIOLOGIST EVAL & MGMT  05/07/2019   IR RADIOLOGIST EVAL & MGMT  05/27/2019   IR RADIOLOGIST EVAL & MGMT  10/23/2019   IR RADIOLOGIST EVAL & MGMT  01/15/2020   VIDEO BRONCHOSCOPY WITH ENDOBRONCHIAL NAVIGATION Right 08/10/2020   Procedure: VIDEO BRONCHOSCOPY WITH ENDOBRONCHIAL NAVIGATION;  Surgeon: Garner Nash, DO;  Location: [place];  Service: Pulmonary;  Laterality: Right;    Family History  Problem Relation Age of Onset   Hyperlipidemia Father    Stroke Father    Alcohol abuse Father    Throat cancer Brother    Lymphoma Brother     Allergies  Allergen Reactions   Aspirin Other (See Comments)    Acid reflux and drooling    Current Outpatient Medications on File Prior to Visit  Medication Sig Dispense Refill   acetaminophen (TYLENOL) 500 MG tablet Take 1,000 mg by mouth every 6 (six) hours as needed for mild pain or moderate pain. For pain     busPIRone  (BUSPAR) 15 MG tablet Take 1 tablet by mouth twice daily 60 tablet 0   clonazePAM (KLONOPIN) 0.5 MG tablet TAKE 1/2 TO 1 (ONE-HALF TO ONE) TABLET BY MOUTH ONCE DAILY AS NEEDED FOR ANXIETY 15 tablet 0   cyclobenzaprine (FLEXERIL) 5 MG tablet 1 tab po  prn neck pain at night  for next 3 nights 30 tablet 0   doxazosin (CARDURA) 4 MG tablet Take 4 mg by mouth at bedtime.     finasteride (PROSCAR) 5 MG tablet Take 1 tablet (5 mg total) by mouth at bedtime. 30 tablet 2   fluticasone (FLONASE) 50 MCG/ACT nasal spray Place 2 sprays into both nostrils daily. 16 g 1   fluticasone-salmeterol (ADVAIR) 250-50  MCG/ACT AEPB INHALE 1 DOSE BY MOUTH IN THE MORNING AND AT BEDTIME 180 each 0   gabapentin (NEURONTIN) 100 MG capsule Take 1 capsule (100 mg total) by mouth 3 (three) times daily. 90 capsule 0   metoprolol tartrate (LOPRESSOR) 25 MG tablet Take 1 tablet by mouth twice daily 180 tablet 1   nystatin-triamcinolone (MYCOLOG II) cream Apply 1 application topically 2 (two) times daily. 30 g 0   No current facility-administered medications on file prior to visit.    BP 110/70   Pulse 100   Temp 98 F (36.7 C)   Resp 18   Ht 5\' 6"  (1.676 m)   Wt 185 lb 3.2 oz (84 kg)   SpO2 97%   BMI 29.89 kg/m        Objective:   Physical Exam  General Mental Status- Alert. General Appearance- Not in acute distress.   Skin General: Color- Normal Color. Moisture- Normal Moisture.  Neck Carotid Arteries- Normal color. Moisture- Normal Moisture. No carotid bruits. No JVD.  Chest and Lung Exam Auscultation: Breath Sounds:-Normal.  Cardiovascular Auscultation:Rythm- Regular. Murmurs & Other Heart Sounds:Auscultation of the heart reveals- No Murmurs.  Abdomen Inspection:-Inspeection Normal. Palpation/Percussion:Note:No mass. Palpation and Percussion of the abdomen reveal- Non Tender, Non Distended + BS, no rebound or guarding.   Neurologic Cranial Nerve exam:- CN III-XII intact(No nystagmus), symmetric  smile. Strength:- 5/5 equal and symmetric strength both upper and lower extremities.      Assessment & Plan:   Patient Instructions  Anxiety- controlled with xanax. Has up to date contract and uds. Will refill when do due with 30 tab rx per month.  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   Get cardiologist to fill out appropiate portions after potential ekg and echo done.   Glad to hear you continue not to smoke.  Degenerative changes to spine by xray and radicular pain improved with gabapentin 100 mg tid. Refill sent to pharmacy.  Follow up in 6 month or sooner if needed.    Mackie Pai, PA-C

## 2022-02-09 NOTE — Telephone Encounter (Addendum)
Pt was seen today.   Rx refill sent to pharmacy.

## 2022-02-22 ENCOUNTER — Encounter: Payer: Self-pay | Admitting: Cardiology

## 2022-02-22 ENCOUNTER — Ambulatory Visit: Payer: Medicare Other | Attending: Cardiology | Admitting: Cardiology

## 2022-02-22 VITALS — BP 98/70 | HR 74 | Ht 66.0 in | Wt 186.0 lb

## 2022-02-22 DIAGNOSIS — I484 Atypical atrial flutter: Secondary | ICD-10-CM | POA: Diagnosis not present

## 2022-02-22 DIAGNOSIS — I2510 Atherosclerotic heart disease of native coronary artery without angina pectoris: Secondary | ICD-10-CM

## 2022-02-22 DIAGNOSIS — Z7901 Long term (current) use of anticoagulants: Secondary | ICD-10-CM

## 2022-02-22 MED ORDER — ROSUVASTATIN CALCIUM 10 MG PO TABS: 10.0000 mg | ORAL_TABLET | Freq: Every day | ORAL | 3 refills | Status: DC

## 2022-02-22 NOTE — Progress Notes (Signed)
Cardiology Office Note:    Date:  02/22/2022   ID:  Patrick Morris, DOB 1951/04/20, MRN [id number]  PCP:  Mackie Pai, PA-C  Cardiologist:  Shirlee More, MD    Referring MD: Mackie Pai, PA-C    ASSESSMENT:    1. Atypical atrial flutter ([place])   2. Chronic anticoagulation   3. Coronary artery calcification seen on CAT scan    PLAN:    In order of problems listed above:  Patrick Morris is done well with rate control is an atypical atrial flutter relatively slow rate continue his beta-blocker presently is not anticoagulated I would not start it now with his ongoing hematuria and the need for prostate surgery.  With his coronary calcification I started him on a statin 2 months we will check lipids and ALT prompting ask if we can place him back on anticoagulant.  If we cannot we should consider a Watchman device.   Next appointment: 6 months   Medication Adjustments/Labs and Tests Ordered: Current medicines are reviewed at length with the patient today.  Concerns regarding medicines are outlined above.  Orders Placed This Encounter  Procedures   Comprehensive metabolic panel   Lipid panel   EKG 12-Lead   Meds ordered this encounter  Medications   rosuvastatin (CRESTOR) 10 MG tablet    Sig: Take 1 tablet (10 mg total) by mouth daily.    Dispense:  90 tablet    Refill:  3    Chief Complaint  Patient presents with   Annual Exam  With atrial flutter  History of Present Illness:    Patrick Morris is a 71 y.o. male with a hx of atypical atrial flutter with chronic anticoagulation coronary artery calcification on CT scan without evidence of ischemia on the myocardial perfusion image non-small cell lung cancer found on screening CT scan emphysema cholelithiasis and aortic atherosclerosis last seen 10/08/2020.  He was treated with radiation therapy for his lung cancer and is seeing urology for kidney lesion.   Compliance with diet, lifestyle and medications: Yes  He is pending  prostate surgery and is still having gross hematuria.  When seen by me December 2021 anticoagulated him when he had bronchoscopy and lung biopsy April 2021 there is a notation that his anticoagulant was discontinued with rectal bleeding it was never restarted He has stopped smoking he feels much better and is not having trouble with palpitation shortness of breath chest pain or syncope. His EKG today shows atypical atrial flutter relatively slow rate variable conduction controlled ventricular response 74 bpm poor R wave progression Past Medical History:  Diagnosis Date   Atypical atrial flutter (HCC)    BPH (benign prostatic hyperplasia)    his specialist is recommending surgery.   Cataract    Chronic anticoagulation    Colitis    COPD (chronic obstructive pulmonary disease) (HCC)    COPD exacerbation (HCC) 01/22/2014   Coronary artery calcification    Ear infection    GERD (gastroesophageal reflux disease)    Hard of hearing    Salmonella    Sinus infection     Past Surgical History:  Procedure Laterality Date   BRONCHIAL BIOPSY  08/10/2020   Procedure: BRONCHIAL BIOPSIES;  Surgeon: Garner Nash, DO;  Location: [facility];  Service: Pulmonary;;   BRONCHIAL BRUSHINGS  08/10/2020   Procedure: BRONCHIAL BRUSHINGS;  Surgeon: Garner Nash, DO;  Location: [place] [facility];  Service: Pulmonary;;   BRONCHIAL NEEDLE ASPIRATION BIOPSY  08/10/2020   Procedure: BRONCHIAL NEEDLE  ASPIRATION BIOPSIES;  Surgeon: Garner Nash, DO;  Location: [facility] [facility];  Service: Pulmonary;;   BRONCHIAL WASHINGS  08/10/2020   Procedure: BRONCHIAL WASHINGS;  Surgeon: Garner Nash, DO;  Location: [place];  Service: Pulmonary;;   COLONOSCOPY     ENDOBRONCHIAL ULTRASOUND  08/10/2020   Procedure: ENDOBRONCHIAL ULTRASOUND;  Surgeon: Garner Nash, DO;  Location: [facility];  Service: Pulmonary;;   EYE SURGERY Right    FIDUCIAL MARKER PLACEMENT  08/10/2020   Procedure: FIDUCIAL MARKER PLACEMENT;   Surgeon: Garner Nash, DO;  Location: [place];  Service: Pulmonary;;   IR RADIOLOGIST EVAL & MGMT  05/07/2019   IR RADIOLOGIST EVAL & MGMT  05/27/2019   IR RADIOLOGIST EVAL & MGMT  10/23/2019   IR RADIOLOGIST EVAL & MGMT  01/15/2020   VIDEO BRONCHOSCOPY WITH ENDOBRONCHIAL NAVIGATION Right 08/10/2020   Procedure: VIDEO BRONCHOSCOPY WITH ENDOBRONCHIAL NAVIGATION;  Surgeon: Garner Nash, DO;  Location: [place];  Service: Pulmonary;  Laterality: Right;    Current Medications: Current Meds  Medication Sig   acetaminophen (TYLENOL) 500 MG tablet Take 1,000 mg by mouth every 6 (six) hours as needed for mild pain or moderate pain. For pain   clonazePAM (KLONOPIN) 0.5 MG tablet 1 tab po q day prn anxiety   cyclobenzaprine (FLEXERIL) 5 MG tablet 1 tab po  prn neck pain at night  for next 3 nights   doxazosin (CARDURA) 4 MG tablet Take 4 mg by mouth at bedtime.   finasteride (PROSCAR) 5 MG tablet Take 1 tablet (5 mg total) by mouth at bedtime.   fluticasone (FLONASE) 50 MCG/ACT nasal spray Place 2 sprays into both nostrils daily.   fluticasone-salmeterol (ADVAIR) 250-50 MCG/ACT AEPB INHALE 1 DOSE BY MOUTH IN THE MORNING AND AT BEDTIME   gabapentin (NEURONTIN) 100 MG capsule Take 1 capsule (100 mg total) by mouth 3 (three) times daily.   metoprolol tartrate (LOPRESSOR) 25 MG tablet Take 1 tablet by mouth twice daily   nystatin-triamcinolone (MYCOLOG II) cream Apply 1 application topically 2 (two) times daily.   rosuvastatin (CRESTOR) 10 MG tablet Take 1 tablet (10 mg total) by mouth daily.     Allergies:   Aspirin   Social History   Socioeconomic History   Marital status: Married    Spouse name: Not on file   Number of children: Not on file   Years of education: Not on file   Highest education level: Not on file  Occupational History   Not on file  Tobacco Use   Smoking status: Former    Packs/day: 1.00    Years: 55.00    Total pack years: 55.00    Types: Cigarettes    Smokeless tobacco: Never   Tobacco comments:    down to 0.5ppd  Vaping Use   Vaping Use: Never used  Substance and Sexual Activity   Alcohol use: Never   Drug use: No   Sexual activity: Not Currently    Birth control/protection: None  Other Topics Concern   Not on file  Social History Narrative   Not on file   Social Determinants of Health   Financial Resource Strain: Low Risk  (04/11/2021)   Overall Financial Resource Strain (CARDIA)    Difficulty of Paying Living Expenses: Not hard at all  Food Insecurity: No Food Insecurity (04/11/2021)   Hunger Vital Sign    Worried About Running Out of Food in the Last Year: Never true    [place] in the  Last Year: Never true  Transportation Needs: No Transportation Needs (04/11/2021)   PRAPARE - Hydrologist (Medical): No    Lack of Transportation (Non-Medical): No  Physical Activity: Inactive (04/11/2021)   Exercise Vital Sign    Days of Exercise per Week: 0 days    Minutes of Exercise per Session: 0 min  Stress: No Stress Concern Present (04/11/2021)   [place]    Feeling of Stress : Not at all  Social Connections: Moderately Isolated (04/11/2021)   Social Connection and Isolation Panel [NHANES]    Frequency of Communication with Friends and Family: More than three times a week    Frequency of Social Gatherings with Friends and Family: More than three times a week    Attends Religious Services: Never    Marine scientist or Organizations: No    Attends Music therapist: Never    Marital Status: Married     Family History: The patient's family history includes Alcohol abuse in his father; Hyperlipidemia in his father; Lymphoma in his brother; Stroke in his father; Throat cancer in his brother. ROS:   Please see the history of present illness.    All other systems reviewed and are negative.  EKGs/Labs/Other  Studies Reviewed:    The following studies were reviewed today:   Recent Labs: 12/27/2021: ALT 8; BUN 13; Creatinine, Ser 0.76; Hemoglobin 12.6; Platelets 284.0; Potassium 4.1; Sodium 139; TSH 0.24  Recent Lipid Panel    Component Value Date/Time   CHOL 112 09/11/2017 0843   TRIG 81.0 09/11/2017 0843   HDL 26.80 (L) 09/11/2017 0843   CHOLHDL 4 09/11/2017 0843   VLDL 16.2 09/11/2017 0843   LDLCALC 69 09/11/2017 0843    Physical Exam:    VS:  BP 98/70 (BP Location: Right Arm, Patient Position: Sitting, Cuff Size: Normal)   Pulse 74   Ht 5\' 6"  (1.676 m)   Wt 186 lb (84.4 kg)   SpO2 97%   BMI 30.02 kg/m     Wt Readings from Last 3 Encounters:  02/22/22 186 lb (84.4 kg)  02/09/22 185 lb 3.2 oz (84 kg)  01/10/22 187 lb (84.8 kg)     GEN:  Well nourished, well developed in no acute distress HEENT: Normal NECK: No JVD; No carotid bruits LYMPHATICS: No lymphadenopathy CARDIAC: RRR, no murmurs, rubs, gallops RESPIRATORY:  Clear to auscultation without rales, wheezing or rhonchi  ABDOMEN: Soft, non-tender, non-distended MUSCULOSKELETAL:  No edema; No deformity  SKIN: Warm and dry NEUROLOGIC:  Alert and oriented x 3 PSYCHIATRIC:  Normal affect    Signed, Shirlee More, MD  02/22/2022 2:06 PM    [place]

## 2022-02-22 NOTE — Patient Instructions (Signed)
Medication Instructions:  Your physician has recommended you make the following change in your medication:   Start Crestor 10 mg daily.  *If you need a refill on your cardiac medications before your next appointment, please call your pharmacy*   Lab Work: Your physician recommends that you return for lab work in: 2 months You need to have labs done when you are fasting.  You can come Monday through Friday 8:30 am to 12:00 pm and 1:15 to 4:30. You do not need to make an appointment as the order has already been placed. The labs you are going to have done are CMETand Lipids.  If you have labs (blood work) drawn today and your tests are completely normal, you will receive your results only by: [place] (if you have MyChart) OR A paper copy in the mail If you have any lab test that is abnormal or we need to change your treatment, we will call you to review the results.   Testing/Procedures: None ordered   Follow-Up: At [facility], you and your health needs are our priority.  As part of our continuing mission to provide you with exceptional heart care, we have created designated Provider Care Teams.  These Care Teams include your primary Cardiologist (physician) and Advanced Practice Providers (APPs -  Physician Assistants and Nurse Practitioners) who all work together to provide you with the care you need, when you need it.  We recommend signing up for the patient portal called "MyChart".  Sign up information is provided on this After Visit Summary.  MyChart is used to connect with patients for Virtual Visits (Telemedicine).  Patients are able to view lab/test results, encounter notes, upcoming appointments, etc.  Non-urgent messages can be sent to your provider as well.   To learn more about what you can do with MyChart, go to [email or web address].    Your next appointment:   6 month(s)  The format for your next appointment:   In Person  Provider:   Shirlee More,  MD   Other Instructions NA

## 2022-03-03 ENCOUNTER — Ambulatory Visit (HOSPITAL_BASED_OUTPATIENT_CLINIC_OR_DEPARTMENT_OTHER)
Admission: RE | Admit: 2022-03-03 | Discharge: 2022-03-03 | Disposition: A | Discharge status: Home / Self Care | Payer: Medicare Other | Source: Ambulatory Visit | Attending: Family Medicine | Admitting: Family Medicine

## 2022-03-03 ENCOUNTER — Ambulatory Visit (INDEPENDENT_AMBULATORY_CARE_PROVIDER_SITE_OTHER): Payer: Medicare Other | Admitting: Family Medicine

## 2022-03-03 ENCOUNTER — Encounter: Payer: Self-pay | Admitting: Family Medicine

## 2022-03-03 VITALS — BP 110/60 | HR 69 | Temp 97.9°F | Resp 18 | Ht 66.0 in | Wt 191.0 lb

## 2022-03-03 DIAGNOSIS — J440 Chronic obstructive pulmonary disease with acute lower respiratory infection: Secondary | ICD-10-CM

## 2022-03-03 DIAGNOSIS — R051 Acute cough: Secondary | ICD-10-CM | POA: Diagnosis not present

## 2022-03-03 DIAGNOSIS — J209 Acute bronchitis, unspecified: Secondary | ICD-10-CM | POA: Insufficient documentation

## 2022-03-03 LAB — POC INFLUENZA A&B (BINAX/QUICKVUE)
Influenza A, POC: NEGATIVE
Influenza B, POC: NEGATIVE

## 2022-03-03 LAB — POC COVID19 BINAXNOW: SARS Coronavirus 2 Ag: NEGATIVE

## 2022-03-03 MED ORDER — AZITHROMYCIN 250 MG PO TABS: ORAL_TABLET | ORAL | 0 refills | Status: DC

## 2022-03-03 MED ORDER — PREDNISONE 10 MG PO TABS: ORAL_TABLET | ORAL | 0 refills | Status: DC

## 2022-03-03 MED ORDER — METHYLPREDNISOLONE ACETATE 80 MG/ML IJ SUSP
80.0000 mg | Freq: Once | INTRAMUSCULAR | Status: AC
  Administered 2022-03-03: 80 mg via INTRAMUSCULAR

## 2022-03-03 NOTE — Patient Instructions (Signed)

## 2022-03-03 NOTE — Progress Notes (Signed)
Subjective:   By signing my name below, I, Patrick Morris, attest that this documentation has been prepared under the direction and in the presence of Patrick Morris, 03/03/2022.     Patient ID: Patrick Morris, male    DOB: July 31, 1950, 71 y.o.   MRN: [id number]  Chief Complaint  Patient presents with   Cough    Sxs started 4-5 days ago, pt states productive cough, congestion, sore throat, no fever. No COVID test.     HPI Patient is in today for an office visit.  Cough Patient is complaining of a productive cough, congestion, body aches, sore throat, dry nose, and nausea starting 4-5 days ago. He did not test for Covid. He denies vomiting and trouble breathing. He manages symptoms with Flonase and Tylenol.    Health Maintenance Due  Topic Date Due   Hepatitis C Screening  Never done   Zoster Vaccines- Shingrix (1 of 2) Never done   COVID-19 Vaccine (3 - Moderna risk series) 01/28/2020   INFLUENZA VACCINE  11/29/2021   Medicare Annual Wellness (AWV)  04/11/2022    Past Medical History:  Diagnosis Date   Atypical atrial flutter (HCC)    BPH (benign prostatic hyperplasia)    his specialist is recommending surgery.   Cataract    Chronic anticoagulation    Colitis    COPD (chronic obstructive pulmonary disease) (HCC)    COPD exacerbation (HCC) 01/22/2014   Coronary artery calcification    Ear infection    GERD (gastroesophageal reflux disease)    Hard of hearing    Salmonella    Sinus infection     Past Surgical History:  Procedure Laterality Date   BRONCHIAL BIOPSY  08/10/2020   Procedure: BRONCHIAL BIOPSIES;  Surgeon: Garner Nash, DO;  Location: [facility];  Service: Pulmonary;;   BRONCHIAL BRUSHINGS  08/10/2020   Procedure: BRONCHIAL BRUSHINGS;  Surgeon: Garner Nash, DO;  Location: [facility];  Service: Pulmonary;;   BRONCHIAL NEEDLE ASPIRATION BIOPSY  08/10/2020   Procedure: BRONCHIAL NEEDLE ASPIRATION BIOPSIES;  Surgeon: Garner Nash, DO;   Location: [facility];  Service: Pulmonary;;   BRONCHIAL WASHINGS  08/10/2020   Procedure: BRONCHIAL WASHINGS;  Surgeon: Garner Nash, DO;  Location: [facility];  Service: Pulmonary;;   COLONOSCOPY     ENDOBRONCHIAL ULTRASOUND  08/10/2020   Procedure: ENDOBRONCHIAL ULTRASOUND;  Surgeon: Garner Nash, DO;  Location: [facility];  Service: Pulmonary;;   EYE SURGERY Right    FIDUCIAL MARKER PLACEMENT  08/10/2020   Procedure: FIDUCIAL MARKER PLACEMENT;  Surgeon: Garner Nash, DO;  Location: [place] [facility];  Service: Pulmonary;;   IR RADIOLOGIST EVAL & MGMT  05/07/2019   IR RADIOLOGIST EVAL & MGMT  05/27/2019   IR RADIOLOGIST EVAL & MGMT  10/23/2019   IR RADIOLOGIST EVAL & MGMT  01/15/2020   VIDEO BRONCHOSCOPY WITH ENDOBRONCHIAL NAVIGATION Right 08/10/2020   Procedure: VIDEO BRONCHOSCOPY WITH ENDOBRONCHIAL NAVIGATION;  Surgeon: Garner Nash, DO;  Location: [facility];  Service: Pulmonary;  Laterality: Right;    Family History  Problem Relation Age of Onset   Hyperlipidemia Father    Stroke Father    Alcohol abuse Father    Throat cancer Brother    Lymphoma Brother     Social History   Socioeconomic History   Marital status: Married    Spouse name: Not on file   Number of children: Not on file   Years of education: Not on file   Highest  education level: Not on file  Occupational History   Not on file  Tobacco Use   Smoking status: Former    Packs/day: 1.00    Years: 55.00    Total pack years: 55.00    Types: Cigarettes   Smokeless tobacco: Never   Tobacco comments:    down to 0.5ppd  Vaping Use   Vaping Use: Never used  Substance and Sexual Activity   Alcohol use: Never   Drug use: No   Sexual activity: Not Currently    Birth control/protection: None  Other Topics Concern   Not on file  Social History Narrative   Not on file   Social Determinants of Health   Financial Resource Strain: Low Risk  (04/11/2021)   Overall Financial Resource Strain (CARDIA)     Difficulty of Paying Living Expenses: Not hard at all  Food Insecurity: No Food Insecurity (04/11/2021)   Hunger Vital Sign    Worried About Running Out of Food in the Last Year: Never true    Ran Out of Food in the Last Year: Never true  Transportation Needs: No Transportation Needs (04/11/2021)   PRAPARE - Hydrologist (Medical): No    Lack of Transportation (Non-Medical): No  Physical Activity: Inactive (04/11/2021)   Exercise Vital Sign    Days of Exercise per Week: 0 days    Minutes of Exercise per Session: 0 min  Stress: No Stress Concern Present (04/11/2021)   [place]    Feeling of Stress : Not at all  Social Connections: Moderately Isolated (04/11/2021)   Social Connection and Isolation Panel [NHANES]    Frequency of Communication with Friends and Family: More than three times a week    Frequency of Social Gatherings with Friends and Family: More than three times a week    Attends Religious Services: Never    Marine scientist or Organizations: No    Attends Archivist Meetings: Never    Marital Status: Married  Human resources officer Violence: Not on file    Outpatient Medications Prior to Visit  Medication Sig Dispense Refill   acetaminophen (TYLENOL) 500 MG tablet Take 1,000 mg by mouth every 6 (six) hours as needed for mild pain or moderate pain. For pain     clonazePAM (KLONOPIN) 0.5 MG tablet 1 tab po q day prn anxiety 30 tablet 1   cyclobenzaprine (FLEXERIL) 5 MG tablet 1 tab po  prn neck pain at night  for next 3 nights 30 tablet 0   doxazosin (CARDURA) 4 MG tablet Take 4 mg by mouth at bedtime.     finasteride (PROSCAR) 5 MG tablet Take 1 tablet (5 mg total) by mouth at bedtime. 30 tablet 2   fluticasone (FLONASE) 50 MCG/ACT nasal spray Place 2 sprays into both nostrils daily. 16 g 1   fluticasone-salmeterol (ADVAIR) 250-50 MCG/ACT AEPB INHALE 1 DOSE BY  MOUTH IN THE MORNING AND AT BEDTIME 180 each 0   gabapentin (NEURONTIN) 100 MG capsule Take 1 capsule (100 mg total) by mouth 3 (three) times daily. 90 capsule 5   metoprolol tartrate (LOPRESSOR) 25 MG tablet Take 1 tablet by mouth twice daily 180 tablet 1   nystatin-triamcinolone (MYCOLOG II) cream Apply 1 application topically 2 (two) times daily. 30 g 0   rosuvastatin (CRESTOR) 10 MG tablet Take 1 tablet (10 mg total) by mouth daily. 90 tablet 3   No facility-administered medications prior to  visit.    Allergies  Allergen Reactions   Aspirin Other (See Comments)    Acid reflux and drooling    Review of Systems  Constitutional:  Negative for chills, fever and malaise/fatigue.       (+) body aches  HENT:  Positive for congestion and sore throat. Negative for hearing loss.        (+) dry nose  Eyes:  Negative for discharge.  Respiratory:  Positive for cough (productive). Negative for sputum production and shortness of breath.   Cardiovascular:  Negative for chest pain, palpitations and leg swelling.  Gastrointestinal:  Positive for nausea. Negative for abdominal pain, blood in stool, constipation, diarrhea, heartburn and vomiting.  Genitourinary:  Negative for dysuria, frequency, hematuria and urgency.  Musculoskeletal:  Negative for back pain, falls and myalgias.  Skin:  Negative for rash.  Neurological:  Negative for dizziness, sensory change, loss of consciousness, weakness and headaches.  Endo/Heme/Allergies:  Negative for environmental allergies. Does not bruise/bleed easily.  Psychiatric/Behavioral:  Negative for depression and suicidal ideas. The patient is not nervous/anxious and does not have insomnia.        Objective:    Physical Exam Vitals and nursing note reviewed.  Constitutional:      General: He is not in acute distress.    Appearance: Normal appearance. He is well-developed. He is not ill-appearing.  HENT:     Head: Normocephalic and atraumatic.     Right  Ear: External ear normal.     Left Ear: External ear normal.  Eyes:     Extraocular Movements: Extraocular movements intact.     Pupils: Pupils are equal, round, and reactive to light.  Neck:     Thyroid: No thyromegaly.  Cardiovascular:     Rate and Rhythm: Normal rate and regular rhythm.     Heart sounds: Normal heart sounds. No murmur heard.    No gallop.  Pulmonary:     Effort: Pulmonary effort is normal. No respiratory distress.     Breath sounds: Examination of the right-upper field reveals wheezing. Examination of the left-upper field reveals wheezing. Decreased breath sounds and wheezing present. No rales.  Chest:     Chest wall: No tenderness.  Musculoskeletal:     Cervical back: Normal range of motion and neck supple.     Right hip: Tenderness present. Normal range of motion. Normal strength.     Left hip: Tenderness present. Normal range of motion. Normal strength.     Right foot: Bony tenderness present. No swelling.     Left foot: Bony tenderness present. No swelling.  Lymphadenopathy:     Cervical: Cervical adenopathy present.  Skin:    General: Skin is warm and dry.  Neurological:     Mental Status: He is alert and oriented to person, place, and time.  Psychiatric:        Behavior: Behavior normal.        Thought Content: Thought content normal.        Judgment: Judgment normal.     BP 110/60 (BP Location: Left Arm, Patient Position: Sitting, Cuff Size: Normal)   Pulse 69   Temp 97.9 F (36.6 C) (Oral)   Resp 18   Ht 5\' 6"  (1.676 m)   Wt 191 lb (86.6 kg)   SpO2 97%   BMI 30.83 kg/m  Wt Readings from Last 3 Encounters:  03/03/22 191 lb (86.6 kg)  02/22/22 186 lb (84.4 kg)  02/09/22 185 lb 3.2 oz (84 kg)  Assessment & Plan:   Problem List Items Addressed This Visit       Unprioritized   Cough   Relevant Orders   POC COVID-19 (Completed)   POC Influenza A&B (Binax test) (Completed)   Other Visit Diagnoses     Acute bronchitis with COPD  ([place])    -  Primary   Relevant Medications   predniSONE (DELTASONE) 10 MG tablet   azithromycin (ZITHROMAX Z-PAK) 250 MG tablet   methylPREDNISolone acetate (DEPO-MEDROL) injection 80 mg (Completed)   Other Relevant Orders   DG Chest 2 View (Completed)      Meds ordered this encounter  Medications   predniSONE (DELTASONE) 10 MG tablet    Sig: TAKE 3 TABLETS PO QD FOR 3 DAYS THEN TAKE 2 TABLETS PO QD FOR 3 DAYS THEN TAKE 1 TABLET PO QD FOR 3 DAYS THEN TAKE 1/2 TAB PO QD FOR 3 DAYS    Dispense:  20 tablet    Refill:  0   azithromycin (ZITHROMAX Z-PAK) 250 MG tablet    Sig: As directed    Dispense:  6 each    Refill:  0   methylPREDNISolone acetate (DEPO-MEDROL) injection 80 mg    I, Patrick Morris, personally preformed the services described in this documentation.  All medical record entries made by the scribe were at my direction and in my presence.  I have reviewed the chart and discharge instructions (if applicable) and agree that the record reflects my personal performance and is accurate and complete. 03/03/2022.   I,Verona Buck,acting as a Education administrator for [employer], DO.,have documented all relevant documentation on the behalf of Ann Held, DO,as directed by  Ann Held, DO while in the presence of Ann Held, DO.    Ann Held, DO

## 2022-03-10 ENCOUNTER — Telehealth: Payer: Self-pay | Admitting: Cardiology

## 2022-03-10 NOTE — Telephone Encounter (Signed)
   Pre-operative Risk Assessment    Patient Name: Patrick Morris  DOB: Oct 06, 1950 MRN: [id number]     Request for Surgical Clearance    Procedure:  Dental Extraction - Amount of Teeth to be Pulled:  10  Date of Surgery:  Clearance TBD                                 Surgeon:  NOT LISTED Surgeon's Group or Practice Name:  [facility] Phone number:  [phone] Fax number:  [phone]   Type of Clearance Requested:   - Medical  - Pharmacy:  [facility]?  FOR HOW LONG?   Type of Anesthesia:   ANESTHESIA WITH EPINEPHRINE   Additional requests/questions:  Does this patient need antibiotics? Please advise surgeon/provider what medications should be held.   Valentino Nose   03/10/2022, 3:15 PM

## 2022-03-13 NOTE — Telephone Encounter (Signed)
   Patient Name: Patrick Morris  DOB: 13-Jun-1950 MRN: [id number]  Primary Cardiologist: None  Chart reviewed as part of pre-operative protocol coverage. Pre-op clearance already addressed by colleagues in earlier phone notes. To summarize recommendations:  - Low risk procedure Proceed he is optimized Not on anticoagulant with hematuria  -Dr. Bettina Gavia  Will route this bundled recommendation to requesting provider via Epic fax function and remove from pre-op pool. Please call with questions.  Elgie Collard, PA-C 03/13/2022, 11:46 AM

## 2022-03-15 ENCOUNTER — Other Ambulatory Visit: Payer: Self-pay | Admitting: Medical

## 2022-03-17 ENCOUNTER — Ambulatory Visit (INDEPENDENT_AMBULATORY_CARE_PROVIDER_SITE_OTHER): Payer: Medicare Other | Admitting: Medical

## 2022-03-17 ENCOUNTER — Encounter: Payer: Self-pay | Admitting: Medical

## 2022-03-17 VITALS — BP 100/80 | HR 80 | Temp 98.0°F | Resp 18 | Ht 66.0 in | Wt 188.8 lb

## 2022-03-17 DIAGNOSIS — Z01818 Encounter for other preprocedural examination: Secondary | ICD-10-CM

## 2022-03-17 DIAGNOSIS — K047 Periapical abscess without sinus: Secondary | ICD-10-CM | POA: Diagnosis not present

## 2022-03-17 MED ORDER — AMOXICILLIN-POT CLAVULANATE 875-125 MG PO TABS: 1.0000 | ORAL_TABLET | Freq: Two times a day (BID) | ORAL | 0 refills | Status: DC

## 2022-03-17 NOTE — Patient Instructions (Signed)
Upcoming dental extraction with 1 to describes recently breaking off and soreness in that area.  Prescribing Augmentin 875 1 tablet twice a day.  I am clearing you for dental extraction but did make note for the oral surgeon to review the recent tooth broke and uncomfortable.  I am asking them to schedule according to their protocol in light of the current situation.  I did fill out your preop clearance form and made note for then to consider staged procedure if needed as they remove all teeth in 1 procedure.  Cardiologist recently cleared.  EKG was done.  Recheck of patient's O2 saturation and pulse both are normal.  Follow-up as regular scheduled or sooner if needed.

## 2022-03-17 NOTE — Progress Notes (Signed)
Subjective:    Patient ID: Patrick Morris, male    DOB: 16-Dec-1950, 71 y.o.   MRN: [id number]  HPI Pt in for preop evaluation for dental procedure to remove 10 teeth. Rt lower tooth recently broke off this morning. He is getting sore with aching.    "Cardiologist office recently saw pt and stated below. Low risk procedure  Proceed he is optimized  Not on anticoagulant with hematuria "  PAC note  "Chart reviewed as part of pre-operative protocol coverage. Pre-op clearance already addressed by colleagues in earlier phone notes. To summarize recommendations:   - Low risk procedure Proceed he is optimized Not on anticoagulant with hematuria  -Dr. Bettina Gavia"    Wife states urologist also ok'd procedure.  Pt tells me he was offered general anesthesia but declined. Surgery will be extraction of teeth use of local anesthesia with epinehprhine.      Review of Systems  Constitutional:  Negative for chills, fatigue and fever.  Respiratory:  Negative for cough, chest tightness, shortness of breath and wheezing.   Cardiovascular:  Negative for chest pain and palpitations.  Gastrointestinal:  Negative for abdominal pain.  Genitourinary:  Negative for dysuria.  Musculoskeletal:  Negative for back pain and neck pain.  Skin:  Negative for pallor and rash.  Neurological:  Negative for dizziness and light-headedness.  Hematological:  Negative for adenopathy. Does not bruise/bleed easily.  Psychiatric/Behavioral:  Negative for behavioral problems.     Past Medical History:  Diagnosis Date   Atypical atrial flutter (HCC)    BPH (benign prostatic hyperplasia)    his specialist is recommending surgery.   Cataract    Chronic anticoagulation    Colitis    COPD (chronic obstructive pulmonary disease) (HCC)    COPD exacerbation (HCC) 01/22/2014   Coronary artery calcification    Ear infection    GERD (gastroesophageal reflux disease)    Hard of hearing    Salmonella    Sinus infection       Social History   Socioeconomic History   Marital status: Married    Spouse name: Not on file   Number of children: Not on file   Years of education: Not on file   Highest education level: Not on file  Occupational History   Not on file  Tobacco Use   Smoking status: Former    Packs/day: 1.00    Years: 55.00    Total pack years: 55.00    Types: Cigarettes   Smokeless tobacco: Never   Tobacco comments:    down to 0.5ppd  Vaping Use   Vaping Use: Never used  Substance and Sexual Activity   Alcohol use: Never   Drug use: No   Sexual activity: Not Currently    Birth control/protection: None  Other Topics Concern   Not on file  Social History Narrative   Not on file   Social Determinants of Health   Financial Resource Strain: Low Risk  (04/11/2021)   Overall Financial Resource Strain (CARDIA)    Difficulty of Paying Living Expenses: Not hard at all  Food Insecurity: No Food Insecurity (04/11/2021)   Hunger Vital Sign    Worried About Running Out of Food in the Last Year: Never true    Ran Out of Food in the Last Year: Never true  Transportation Needs: No Transportation Needs (04/11/2021)   PRAPARE - Hydrologist (Medical): No    Lack of Transportation (Non-Medical): No  Physical Activity: Inactive (  04/11/2021)   Exercise Vital Sign    Days of Exercise per Week: 0 days    Minutes of Exercise per Session: 0 min  Stress: No Stress Concern Present (04/11/2021)   [place]    Feeling of Stress : Not at all  Social Connections: Moderately Isolated (04/11/2021)   Social Connection and Isolation Panel [NHANES]    Frequency of Communication with Friends and Family: More than three times a week    Frequency of Social Gatherings with Friends and Family: More than three times a week    Attends Religious Services: Never    Marine scientist or Organizations: No    Attends  Archivist Meetings: Never    Marital Status: Married  Human resources officer Violence: Not on file    Past Surgical History:  Procedure Laterality Date   BRONCHIAL BIOPSY  08/10/2020   Procedure: BRONCHIAL BIOPSIES;  Surgeon: Garner Nash, DO;  Location: [facility];  Service: Pulmonary;;   BRONCHIAL BRUSHINGS  08/10/2020   Procedure: BRONCHIAL BRUSHINGS;  Surgeon: Garner Nash, DO;  Location: [place];  Service: Pulmonary;;   BRONCHIAL NEEDLE ASPIRATION BIOPSY  08/10/2020   Procedure: BRONCHIAL NEEDLE ASPIRATION BIOPSIES;  Surgeon: Garner Nash, DO;  Location: [place];  Service: Pulmonary;;   BRONCHIAL WASHINGS  08/10/2020   Procedure: BRONCHIAL WASHINGS;  Surgeon: Garner Nash, DO;  Location: [facility];  Service: Pulmonary;;   COLONOSCOPY     ENDOBRONCHIAL ULTRASOUND  08/10/2020   Procedure: ENDOBRONCHIAL ULTRASOUND;  Surgeon: Garner Nash, DO;  Location: [place];  Service: Pulmonary;;   EYE SURGERY Right    FIDUCIAL MARKER PLACEMENT  08/10/2020   Procedure: FIDUCIAL MARKER PLACEMENT;  Surgeon: Garner Nash, DO;  Location: [facility];  Service: Pulmonary;;   IR RADIOLOGIST EVAL & MGMT  05/07/2019   IR RADIOLOGIST EVAL & MGMT  05/27/2019   IR RADIOLOGIST EVAL & MGMT  10/23/2019   IR RADIOLOGIST EVAL & MGMT  01/15/2020   VIDEO BRONCHOSCOPY WITH ENDOBRONCHIAL NAVIGATION Right 08/10/2020   Procedure: VIDEO BRONCHOSCOPY WITH ENDOBRONCHIAL NAVIGATION;  Surgeon: Garner Nash, DO;  Location: [place];  Service: Pulmonary;  Laterality: Right;    Family History  Problem Relation Age of Onset   Hyperlipidemia Father    Stroke Father    Alcohol abuse Father    Throat cancer Brother    Lymphoma Brother     Allergies  Allergen Reactions   Aspirin Other (See Comments)    Acid reflux and drooling    Current Outpatient Medications on File Prior to Visit  Medication Sig Dispense Refill   acetaminophen (TYLENOL) 500 MG tablet Take 1,000 mg by  mouth every 6 (six) hours as needed for mild pain or moderate pain. For pain     clonazePAM (KLONOPIN) 0.5 MG tablet 1 tab po q day prn anxiety 30 tablet 1   cyclobenzaprine (FLEXERIL) 5 MG tablet 1 tab po  prn neck pain at night  for next 3 nights 30 tablet 0   doxazosin (CARDURA) 4 MG tablet Take 4 mg by mouth at bedtime.     finasteride (PROSCAR) 5 MG tablet Take 1 tablet (5 mg total) by mouth at bedtime. 30 tablet 2   fluticasone (FLONASE) 50 MCG/ACT nasal spray Use 2 spray(s) in each nostril once daily 16 g 0   fluticasone-salmeterol (ADVAIR) 250-50 MCG/ACT AEPB INHALE 1 DOSE BY MOUTH IN THE MORNING AND AT BEDTIME 180 each  0   gabapentin (NEURONTIN) 100 MG capsule Take 1 capsule (100 mg total) by mouth 3 (three) times daily. 90 capsule 5   metoprolol tartrate (LOPRESSOR) 25 MG tablet Take 1 tablet by mouth twice daily 180 tablet 1   nystatin-triamcinolone (MYCOLOG II) cream Apply 1 application topically 2 (two) times daily. 30 g 0   rosuvastatin (CRESTOR) 10 MG tablet Take 1 tablet (10 mg total) by mouth daily. 90 tablet 3   azithromycin (ZITHROMAX Z-PAK) 250 MG tablet As directed (Patient not taking: Reported on 03/17/2022) 6 each 0   predniSONE (DELTASONE) 10 MG tablet TAKE 3 TABLETS PO QD FOR 3 DAYS THEN TAKE 2 TABLETS PO QD FOR 3 DAYS THEN TAKE 1 TABLET PO QD FOR 3 DAYS THEN TAKE 1/2 TAB PO QD FOR 3 DAYS (Patient not taking: Reported on 03/17/2022) 20 tablet 0   No current facility-administered medications on file prior to visit.    BP 100/80 (BP Location: Right Arm, Patient Position: Sitting, Cuff Size: Normal)   Pulse 80   Temp 98 F (36.7 C) (Oral)   Resp 18   Ht 5\' 6"  (1.676 m)   Wt 188 lb 12.8 oz (85.6 kg)   SpO2 98%   BMI 30.47 kg/m        Objective:   Physical Exam   General Mental Status- Alert. General Appearance- Not in acute distress.   Skin General: Color- Normal Color. Moisture- Normal Moisture.  Neck Carotid Arteries- Normal color. Moisture- Normal  Moisture. No carotid bruits. No JVD.  Chest and Lung Exam Auscultation: Breath Sounds:-Normal.  Cardiovascular Auscultation:Rythm- Regular. Murmurs & Other Heart Sounds:Auscultation of the heart reveals- No Murmurs.  Abdomen Inspection:-Inspeection Normal. Palpation/Percussion:Note:No mass. Palpation and Percussion of the abdomen reveal- Non Tender, Non Distended + BS, no rebound or guarding.   Neurologic Cranial Nerve exam:- CN III-XII intact(No nystagmus), symmetric smile. Strength:- 5/5 equal and symmetric strength both upper and lower extremities.    Mouth- on lower teeth none seen(recent rt lower toot broke off).  The gum in this area is slightly swollen and tender.  Other teeth broke off in past and gums grew over.    Assessment & Plan:   Patient Instructions  Upcoming dental extraction with 1 to describes recently breaking off and soreness in that area.  Prescribing Augmentin 875 1 tablet twice a day.  I am clearing you for dental extraction but did make note for the oral surgeon to review the recent tooth broke and uncomfortable.  I am asking them to schedule according to their protocol in light of the current situation.  I did fill out your preop clearance form and made note for then to consider staged procedure if needed as they remove all teeth in 1 procedure.  Cardiologist recently cleared.  EKG was done.  Recheck of patient's O2 saturation and pulse both are normal.  Follow-up as regular scheduled or sooner if needed.   Mackie Pai, PA-C

## 2022-03-24 ENCOUNTER — Other Ambulatory Visit: Payer: Self-pay | Admitting: Cardiology

## 2022-03-27 ENCOUNTER — Telehealth: Payer: Self-pay | Admitting: Cardiology

## 2022-03-27 MED ORDER — METOPROLOL TARTRATE 25 MG PO TABS: 25.0000 mg | ORAL_TABLET | Freq: Two times a day (BID) | ORAL | 2 refills | Status: DC

## 2022-03-27 NOTE — Telephone Encounter (Signed)
Patient states he needs refill on Metoprolol  Best number [phone]  Preferred [facility] on [address] in [place]

## 2022-03-27 NOTE — Telephone Encounter (Signed)
Refill of Metoprolol Tartrate 50 mg sent to [facility].

## 2022-03-27 NOTE — Telephone Encounter (Signed)
Metoprolol Tartrate 25 mg sent to [facility]. Error on previous encounter

## 2022-04-05 ENCOUNTER — Other Ambulatory Visit: Payer: Self-pay | Admitting: Medical

## 2022-04-05 ENCOUNTER — Other Ambulatory Visit: Payer: Self-pay | Admitting: Cardiology

## 2022-04-05 NOTE — Telephone Encounter (Signed)
Requesting: clonazepam 0.5mg  Contract: 04/29/21 UDS:12/27/21 Last Visit: 03/17/22 Next Visit: 08/11/22 Last Refill: 02/09/22 #30 and 1rf  Please Advise

## 2022-04-07 ENCOUNTER — Other Ambulatory Visit: Payer: Self-pay | Admitting: Medical

## 2022-04-07 NOTE — Telephone Encounter (Signed)
Requesting: klonopin  Contract:04/29/2021 UDS:12/27/2021 Last Visit:03/17/22 Next Visit:08/11/22 Last Refill:02/09/22  Please Advise

## 2022-04-07 NOTE — Telephone Encounter (Signed)
He got 30 tab rx of xanax on 03-14-2022. What does his contract state. I think it is 30 tab and if so he is early. I will send in 6 tab rx but he needs to make appointment next week to discuss frequency of use and anxiety level. My goal is to give him give him certain amount to last 30 days. So on appointment will discuss.

## 2022-04-19 ENCOUNTER — Telehealth: Payer: Self-pay

## 2022-04-19 ENCOUNTER — Other Ambulatory Visit: Payer: Self-pay | Admitting: Medical

## 2022-04-19 MED ORDER — CLONAZEPAM 0.5 MG PO TABS: ORAL_TABLET | ORAL | 2 refills | Status: DC

## 2022-04-19 NOTE — Addendum Note (Signed)
Addended by: Anabel Halon on: 04/19/2022 03:43 PM   Modules accepted: Orders

## 2022-04-19 NOTE — Telephone Encounter (Signed)
Pt came into office today , stating they only received 6 tablets instead of their normal 30 tablets for the clonazepam , please adivse

## 2022-04-19 NOTE — Telephone Encounter (Signed)
Rx refill sent.

## 2022-04-25 ENCOUNTER — Other Ambulatory Visit: Payer: Self-pay | Admitting: Medical

## 2022-04-25 NOTE — Telephone Encounter (Signed)
Patient's wife called stating her husband still has an infection in his teeth and needs more antibiotics to treat it. Please advise.

## 2022-05-03 ENCOUNTER — Other Ambulatory Visit: Payer: Self-pay | Admitting: Medical

## 2022-05-19 ENCOUNTER — Telehealth: Payer: Self-pay | Admitting: Medical

## 2022-05-19 NOTE — Telephone Encounter (Signed)
Copied from CRM [id number]. Topic: Medicare AWV >> May 19, 2022 10:31 AM Payton Doughty wrote: Reason for CRM: Left message for patient to schedule Annual Wellness Visit.  Please schedule with Health Nurse Advisor at [facility]. Call [place] at [phone]

## 2022-05-25 DIAGNOSIS — C672 Malignant neoplasm of lateral wall of bladder: Secondary | ICD-10-CM | POA: Diagnosis not present

## 2022-05-25 DIAGNOSIS — N138 Other obstructive and reflux uropathy: Secondary | ICD-10-CM | POA: Diagnosis not present

## 2022-05-25 DIAGNOSIS — D49519 Neoplasm of unspecified behavior of unspecified kidney: Secondary | ICD-10-CM | POA: Diagnosis not present

## 2022-05-25 DIAGNOSIS — C342 Malignant neoplasm of middle lobe, bronchus or lung: Secondary | ICD-10-CM | POA: Diagnosis not present

## 2022-06-09 ENCOUNTER — Encounter: Payer: Self-pay | Admitting: Medical

## 2022-06-09 ENCOUNTER — Ambulatory Visit (INDEPENDENT_AMBULATORY_CARE_PROVIDER_SITE_OTHER): Payer: 59 | Admitting: Medical

## 2022-06-09 VITALS — BP 120/84 | HR 98 | Temp 98.0°F | Resp 18 | Ht 66.0 in | Wt 195.0 lb

## 2022-06-09 DIAGNOSIS — R6883 Chills (without fever): Secondary | ICD-10-CM

## 2022-06-09 DIAGNOSIS — J3489 Other specified disorders of nose and nasal sinuses: Secondary | ICD-10-CM

## 2022-06-09 DIAGNOSIS — R051 Acute cough: Secondary | ICD-10-CM

## 2022-06-09 DIAGNOSIS — U071 COVID-19: Secondary | ICD-10-CM | POA: Diagnosis not present

## 2022-06-09 DIAGNOSIS — R059 Cough, unspecified: Secondary | ICD-10-CM

## 2022-06-09 DIAGNOSIS — J320 Chronic maxillary sinusitis: Secondary | ICD-10-CM

## 2022-06-09 LAB — POCT INFLUENZA A/B
Influenza A, POC: NEGATIVE
Influenza B, POC: NEGATIVE

## 2022-06-09 LAB — POC COVID19 BINAXNOW: SARS Coronavirus 2 Ag: POSITIVE — AB

## 2022-06-09 MED ORDER — FLUTICASONE PROPIONATE 50 MCG/ACT NA SUSP: 2.0000 | Freq: Every day | NASAL | 1 refills | Status: DC

## 2022-06-09 MED ORDER — NIRMATRELVIR/RITONAVIR (PAXLOVID)TABLET: 3.0000 | ORAL_TABLET | Freq: Two times a day (BID) | ORAL | 0 refills | Status: AC

## 2022-06-09 MED ORDER — AZITHROMYCIN 250 MG PO TABS: ORAL_TABLET | ORAL | 0 refills | Status: AC

## 2022-06-09 MED ORDER — BENZONATATE 100 MG PO CAPS: 100.0000 mg | ORAL_CAPSULE | Freq: Three times a day (TID) | ORAL | 0 refills | Status: DC | PRN

## 2022-06-09 MED ORDER — FLUTICASONE PROPIONATE 50 MCG/ACT NA SUSP: NASAL | 0 refills | Status: DC

## 2022-06-09 NOTE — Patient Instructions (Addendum)
Covid infection on day 3 of symptoms.  Moderate symptoms with history of COPD and lung cancer.  Prescribing Paxlovid antiviral.  Benefits versus risk discussed.  Can continue Flonase nasal spray and prescribing benzonatate for cough.  In light of current or productive cough and risk factors decided to go ahead and prescribe azithromycin since you already have some maxillary sinus pressure.   Start Paxlovid tomorrow morning and hold Crestor for 24 hours after last dose of Paxlovid.  Also stop doxazosin.  Resume doxazosin 24 hours after Paxlovid finished.  Continue your Advair twice daily prn.  Check oxygen saturations and if O2 saturation less than 94% let us know.  Any severe shortness of breath be seen in the emergency department.  Follow-up in 7 days or sooner if needed.  Can use Tylenol for body aches or fever.

## 2022-06-09 NOTE — Progress Notes (Deleted)
Pt here for wellness exam  Colonoscopy-  Lipid panel(at least q 5 years)-  Abdominal aortic aneurysm screening(74 yo-74 yo and ever smoked)-  CT chest yearly- at age 40 yo. If smoker or if quite smoking within past year.  If diabetic, a1c, ldl urine micro/cr ration, foot exam, eye exam and bp less than 140/90.  Exercise- Diet- Smoking- Alcohol- Seat belts- Smoke alarms- Fall risk- Dementia risk- Depression risk- End of life care-  Influenza- Pneumonia- Zostavax- Tdap or td-

## 2022-06-09 NOTE — Progress Notes (Signed)
Subjective:    Patient ID: Patrick Morris, male    DOB: Feb 03, 1951, 72 y.o.   MRN: [id number]  HPI  Pt in with cough, congestion, runny nose and mild ha. Some sinus pressure as well. Pt states symptoms for 3-4 days. Started after he finished course augmentin which he took after 10 teeth removed. Teeth extracted about 14 days ago. All teeth from bottom.  He is coughing up some mucus. Pt has copd. He stopped smoking 2 years ago. Has advair to use twice daily  Pt does have lung cancer rt middle lobe. Followed by [facility]. 02 sat % 96%.      Review of Systems  Constitutional:  Positive for chills. Negative for fatigue and fever.  HENT:  Positive for congestion and sinus pressure.   Respiratory:  Positive for cough. Negative for chest tightness, shortness of breath and wheezing.   Cardiovascular:  Negative for chest pain and palpitations.  Gastrointestinal:  Negative for abdominal pain, blood in stool and nausea.  Genitourinary:  Negative for dysuria, flank pain and frequency.  Musculoskeletal:  Negative for back pain.  Skin:  Negative for rash.  Neurological:  Negative for dizziness, numbness and headaches.  Hematological:  Negative for adenopathy. Does not bruise/bleed easily.  Psychiatric/Behavioral:  Negative for behavioral problems and decreased concentration.     Past Medical History:  Diagnosis Date   Atypical atrial flutter (HCC)    BPH (benign prostatic hyperplasia)    his specialist is recommending surgery.   Cataract    Chronic anticoagulation    Colitis    COPD (chronic obstructive pulmonary disease) (HCC)    COPD exacerbation (HCC) 01/22/2014   Coronary artery calcification    Ear infection    GERD (gastroesophageal reflux disease)    Hard of hearing    Salmonella    Sinus infection      Social History   Socioeconomic History   Marital status: Married    Spouse name: Not on file   Number of children: Not on file   Years of education: Not on file   Highest  education level: Not on file  Occupational History   Not on file  Tobacco Use   Smoking status: Former    Packs/day: 1.00    Years: 55.00    Total pack years: 55.00    Types: Cigarettes   Smokeless tobacco: Never   Tobacco comments:    down to 0.5ppd  Vaping Use   Vaping Use: Never used  Substance and Sexual Activity   Alcohol use: Never   Drug use: No   Sexual activity: Not Currently    Birth control/protection: None  Other Topics Concern   Not on file  Social History Narrative   Not on file   Social Determinants of Health   Financial Resource Strain: Low Risk  (04/11/2021)   Overall Financial Resource Strain (CARDIA)    Difficulty of Paying Living Expenses: Not hard at all  Food Insecurity: No Food Insecurity (04/11/2021)   Hunger Vital Sign    Worried About Running Out of Food in the Last Year: Never true    Ran Out of Food in the Last Year: Never true  Transportation Needs: No Transportation Needs (04/11/2021)   PRAPARE - Hydrologist (Medical): No    Lack of Transportation (Non-Medical): No  Physical Activity: Inactive (04/11/2021)   Exercise Vital Sign    Days of Exercise per Week: 0 days    Minutes of Exercise  per Session: 0 min  Stress: No Stress Concern Present (04/11/2021)   [place]    Feeling of Stress : Not at all  Social Connections: Moderately Isolated (04/11/2021)   Social Connection and Isolation Panel [NHANES]    Frequency of Communication with Friends and Family: More than three times a week    Frequency of Social Gatherings with Friends and Family: More than three times a week    Attends Religious Services: Never    Marine scientist or Organizations: No    Attends Archivist Meetings: Never    Marital Status: Married  Human resources officer Violence: Not on file    Past Surgical History:  Procedure Laterality Date   BRONCHIAL BIOPSY   08/10/2020   Procedure: BRONCHIAL BIOPSIES;  Surgeon: Garner Nash, DO;  Location: [facility];  Service: Pulmonary;;   BRONCHIAL BRUSHINGS  08/10/2020   Procedure: BRONCHIAL BRUSHINGS;  Surgeon: Garner Nash, DO;  Location: [facility];  Service: Pulmonary;;   BRONCHIAL NEEDLE ASPIRATION BIOPSY  08/10/2020   Procedure: BRONCHIAL NEEDLE ASPIRATION BIOPSIES;  Surgeon: Garner Nash, DO;  Location: [facility];  Service: Pulmonary;;   BRONCHIAL WASHINGS  08/10/2020   Procedure: BRONCHIAL WASHINGS;  Surgeon: Garner Nash, DO;  Location: [facility];  Service: Pulmonary;;   COLONOSCOPY     ENDOBRONCHIAL ULTRASOUND  08/10/2020   Procedure: ENDOBRONCHIAL ULTRASOUND;  Surgeon: Garner Nash, DO;  Location: [facility];  Service: Pulmonary;;   EYE SURGERY Right    FIDUCIAL MARKER PLACEMENT  08/10/2020   Procedure: FIDUCIAL MARKER PLACEMENT;  Surgeon: Garner Nash, DO;  Location: [facility];  Service: Pulmonary;;   IR RADIOLOGIST EVAL & MGMT  05/07/2019   IR RADIOLOGIST EVAL & MGMT  05/27/2019   IR RADIOLOGIST EVAL & MGMT  10/23/2019   IR RADIOLOGIST EVAL & MGMT  01/15/2020   VIDEO BRONCHOSCOPY WITH ENDOBRONCHIAL NAVIGATION Right 08/10/2020   Procedure: VIDEO BRONCHOSCOPY WITH ENDOBRONCHIAL NAVIGATION;  Surgeon: Garner Nash, DO;  Location: [facility];  Service: Pulmonary;  Laterality: Right;    Family History  Problem Relation Age of Onset   Hyperlipidemia Father    Stroke Father    Alcohol abuse Father    Throat cancer Brother    Lymphoma Brother     Allergies  Allergen Reactions   Aspirin Other (See Comments)    Acid reflux and drooling    Current Outpatient Medications on File Prior to Visit  Medication Sig Dispense Refill   acetaminophen (TYLENOL) 500 MG tablet Take 1,000 mg by mouth every 6 (six) hours as needed for mild pain or moderate pain. For pain     amoxicillin-clavulanate (AUGMENTIN) 875-125 MG tablet Take 1 tablet by mouth twice daily 20 tablet 0    clonazePAM (KLONOPIN) 0.5 MG tablet TAKE 1 TABLET BY MOUTH ONCE DAILY AS NEEDED FOR ANXIETY 30 tablet 2   cyclobenzaprine (FLEXERIL) 5 MG tablet 1 tab po  prn neck pain at night  for next 3 nights 30 tablet 0   doxazosin (CARDURA) 4 MG tablet Take 4 mg by mouth at bedtime.     finasteride (PROSCAR) 5 MG tablet Take 1 tablet (5 mg total) by mouth at bedtime. 30 tablet 2   fluticasone (FLONASE) 50 MCG/ACT nasal spray Use 2 spray(s) in each nostril once daily 16 g 0   fluticasone-salmeterol (ADVAIR) 250-50 MCG/ACT AEPB INHALE 1 DOSE BY MOUTH IN THE MORNING AND AT BEDTIME 180 each 0  gabapentin (NEURONTIN) 100 MG capsule Take 1 capsule (100 mg total) by mouth 3 (three) times daily. 90 capsule 5   metoprolol tartrate (LOPRESSOR) 25 MG tablet Take 1 tablet (25 mg total) by mouth 2 (two) times daily. 180 tablet 2   nystatin-triamcinolone (MYCOLOG II) cream Apply 1 application topically 2 (two) times daily. 30 g 0   predniSONE (DELTASONE) 10 MG tablet TAKE 3 TABLETS PO QD FOR 3 DAYS THEN TAKE 2 TABLETS PO QD FOR 3 DAYS THEN TAKE 1 TABLET PO QD FOR 3 DAYS THEN TAKE 1/2 TAB PO QD FOR 3 DAYS 20 tablet 0   rosuvastatin (CRESTOR) 10 MG tablet Take 1 tablet (10 mg total) by mouth daily. 90 tablet 3   No current facility-administered medications on file prior to visit.    BP 120/84   Pulse 98   Temp 98 F (36.7 C)   Resp 18   Ht 5\' 6"  (1.676 m)   Wt 195 lb (88.5 kg)   SpO2 96%   BMI 31.47 kg/m        Objective:   Physical Exam  General Mental Status- Alert. General Appearance- Not in acute distress.   Skin General: Color- Normal Color. Moisture- Normal Moisture.  Neck Carotid Arteries- Normal color. Moisture- Normal Moisture. No carotid bruits. No JVD.  Chest and Lung Exam Auscultation: Breath Sounds:-Normal.  Cardiovascular Auscultation:Rythm- Regular. Murmurs & Other Heart Sounds:Auscultation of the heart reveals- No Murmurs.  Abdomen Inspection:-Inspeection  Normal. Palpation/Percussion:Note:No mass. Palpation and Percussion of the abdomen reveal- Non Tender, Non Distended + BS, no rebound or guarding.   Neurologic Cranial Nerve exam:- CN III-XII intact(No nystagmus), symmetric smile. Strength:- 5/5 equal and symmetric strength both upper and lower extremities.       Assessment & Plan:   Patient Instructions  Covid infection on day 3 of symptoms.  Moderate symptoms with history of COPD and lung cancer.  Prescribing Paxlovid antiviral.  Benefits versus risk discussed.  Can continue Flonase nasal spray and prescribing benzonatate for cough.  In light of current or productive cough and risk factors decided to go ahead and prescribe azithromycin since you already have some maxillary sinus pressure.   Start Paxlovid tomorrow morning and hold Crestor for 24 hours after last dose of Paxlovid.  Also stop doxazosin.  Resume doxazosin 24 hours after Paxlovid finished.  Continue your Advair twice daily.  Check oxygen saturations and if O2 saturation less than 94% let us know.  Any severe shortness of breath be seen in the emergency department.  Follow-up in 7 days or sooner if needed.  Can use Tylenol for body aches or fever.     Mackie Pai, PA-C

## 2022-06-09 NOTE — Addendum Note (Signed)
Addended by: Anabel Halon on: 06/09/2022 03:29 PM   Modules accepted: Orders

## 2022-06-15 ENCOUNTER — Telehealth: Payer: Self-pay | Admitting: Medical

## 2022-06-15 NOTE — Telephone Encounter (Signed)
Copied from [place]. Topic: Medicare AWV >> Jun 15, 2022  9:55 AM Devoria Glassing wrote:  Reason for CRM: Left message for patient to schedule Annual Wellness Visit(AWV).  Please schedule with Health Nurse Advisor at [facility]. Please call [phone] ask for [facility].

## 2022-07-03 ENCOUNTER — Other Ambulatory Visit: Payer: Self-pay | Admitting: Medical

## 2022-07-05 DIAGNOSIS — U071 COVID-19: Secondary | ICD-10-CM | POA: Diagnosis not present

## 2022-07-05 DIAGNOSIS — R059 Cough, unspecified: Secondary | ICD-10-CM | POA: Diagnosis not present

## 2022-07-05 DIAGNOSIS — R079 Chest pain, unspecified: Secondary | ICD-10-CM | POA: Diagnosis not present

## 2022-07-05 DIAGNOSIS — I10 Essential (primary) hypertension: Secondary | ICD-10-CM | POA: Diagnosis not present

## 2022-07-05 DIAGNOSIS — Z8616 Personal history of COVID-19: Secondary | ICD-10-CM | POA: Diagnosis not present

## 2022-07-05 DIAGNOSIS — R051 Acute cough: Secondary | ICD-10-CM | POA: Diagnosis not present

## 2022-07-12 ENCOUNTER — Other Ambulatory Visit: Payer: Self-pay | Admitting: Medical

## 2022-07-12 NOTE — Telephone Encounter (Signed)
Requesting: klonopin  Contract: 04/19/22 UDS:12/27/21 Last Visit:06/09/22 Next Visit:08/11/22 Last Refill:04/19/22  Please Advise

## 2022-07-13 NOTE — Telephone Encounter (Signed)
Rx refill send to pharmacy.

## 2022-07-26 DIAGNOSIS — I2510 Atherosclerotic heart disease of native coronary artery without angina pectoris: Secondary | ICD-10-CM | POA: Diagnosis not present

## 2022-07-27 ENCOUNTER — Other Ambulatory Visit: Payer: Self-pay | Admitting: Medical

## 2022-07-27 LAB — COMPREHENSIVE METABOLIC PANEL
ALT: 14 IU/L (ref 0–44)
AST: 13 IU/L (ref 0–40)
Albumin/Globulin Ratio: 1.5 (ref 1.2–2.2)
Albumin: 4 g/dL (ref 3.8–4.8)
Alkaline Phosphatase: 94 IU/L (ref 44–121)
BUN/Creatinine Ratio: 17 (ref 10–24)
BUN: 11 mg/dL (ref 8–27)
Bilirubin Total: 0.6 mg/dL (ref 0.0–1.2)
CO2: 24 mmol/L (ref 20–29)
Calcium: 9 mg/dL (ref 8.6–10.2)
Chloride: 106 mmol/L (ref 96–106)
Creatinine, Ser: 0.64 mg/dL — ABNORMAL LOW (ref 0.76–1.27)
Globulin, Total: 2.6 g/dL (ref 1.5–4.5)
Glucose: 91 mg/dL (ref 70–99)
Potassium: 4 mmol/L (ref 3.5–5.2)
Sodium: 142 mmol/L (ref 134–144)
Total Protein: 6.6 g/dL (ref 6.0–8.5)
eGFR: 100 mL/min/{1.73_m2} (ref >59)

## 2022-07-27 LAB — LIPID PANEL
Chol/HDL Ratio: 3.8 ratio (ref 0.0–5.0)
Cholesterol, Total: 124 mg/dL (ref 100–199)
HDL: 33 mg/dL — ABNORMAL LOW (ref >39)
LDL Chol Calc (NIH): 74 mg/dL (ref 0–99)
Triglycerides: 86 mg/dL (ref 0–149)
VLDL Cholesterol Cal: 17 mg/dL (ref 5–40)

## 2022-08-04 ENCOUNTER — Ambulatory Visit (INDEPENDENT_AMBULATORY_CARE_PROVIDER_SITE_OTHER): Payer: 59 | Admitting: Family Medicine

## 2022-08-04 ENCOUNTER — Encounter: Payer: Self-pay | Admitting: Family Medicine

## 2022-08-04 VITALS — BP 122/69 | HR 59 | Temp 97.9°F | Wt 202.0 lb

## 2022-08-04 DIAGNOSIS — H00012 Hordeolum externum right lower eyelid: Secondary | ICD-10-CM

## 2022-08-04 MED ORDER — ERYTHROMYCIN 5 MG/GM OP OINT: 1.0000 | TOPICAL_OINTMENT | Freq: Every day | OPHTHALMIC | 1 refills | Status: AC

## 2022-08-04 NOTE — Patient Instructions (Signed)
Warm compresses twice a day.  J&J baby shampoo  (no tears) and wash eye daily.   Erythromycin ointment in eye as instructed nightly before bed for 1 week.    Stye A stye, also known as a hordeolum, is a bump that forms on an eyelid. It may look like a pimple next to the eyelash. A stye can form inside the eyelid (internal stye) or outside the eyelid (external stye). A stye can cause redness, swelling, and pain on the eyelid. Styes are very common. Anyone can get them at any age. They usually occur in just one eye at a time, but you may have more than one in either eye. What are the causes? A stye is caused by an infection. The infection is almost always caused by bacteria called Staphylococcus aureus. This is a common type of bacteria that lives on the skin. An internal stye may result from an infected oil-producing gland inside the eyelid. An external stye may be caused by an infection at the base of the eyelash (hair follicle). What increases the risk? You are more likely to develop a stye if: You have had a stye before. You have any of these conditions: Red, itchy, inflamed eyelids (blepharitis). A skin condition such as seborrheic dermatitis or rosacea. High fat levels in your blood (lipids). Dry eyes. What are the signs or symptoms? The most common symptom of a stye is eyelid pain. Internal styes are more painful than external styes. Other symptoms may include: Painful swelling of your eyelid. A scratchy feeling in your eye. Tearing and redness of your eye. A pimple-like bump on the edge of the eyelid. Pus draining from the stye. How is this diagnosed? Your health care provider may be able to diagnose a stye just by examining your eye. The health care provider may also check to make sure: You do not have a fever or other signs of a more serious infection. The infection has not spread to other parts of your eye or areas around your eye. How is this treated? Most styes will clear  up in a few days without treatment or with warm compresses applied to the area. You may need to use antibiotic drops or ointment to treat an infection. Sometimes, steroid drops or ointment are used in addition to antibiotics. In some cases, your health care provider may give you a small steroid injection in the eyelid. If your stye does not heal with routine treatment, your health care provider may drain pus from the stye using a thin blade or needle. This may be done if the stye is large, causing a lot of pain, or affecting your vision. Follow these instructions at home: Take over-the-counter and prescription medicines only as told by your health care provider. This includes eye drops or ointments. If you were prescribed an antibiotic medicine, steroid medicine, or both, apply or use them as told by your health care provider. Do not stop using the medicine even if your condition improves. Apply a warm, wet cloth (warm compress) to your eye for 5-10 minutes, 4 to 6 times a day. Clean the affected eyelid as directed by your health care provider. Do not wear contact lenses or eye makeup until your stye has healed and your health care provider says that it is safe. Do not try to pop or drain the stye. Do not rub your eye. Contact a health care provider if: You have chills or a fever. Your stye does not go away after several days.  Your stye affects your vision. Your eyeball becomes swollen, red, or painful. Get help right away if: You have pain when moving your eye around. Summary A stye is a bump that forms on an eyelid. It may look like a pimple next to the eyelash. A stye can form inside the eyelid (internal stye) or outside the eyelid (external stye). A stye can cause redness, swelling, and pain on the eyelid. Your health care provider may be able to diagnose a stye just by examining your eye. Apply a warm, wet cloth (warm compress) to your eye for 5-10 minutes, 4 to 6 times a day. This  information is not intended to replace advice given to you by your health care provider. Make sure you discuss any questions you have with your health care provider. Document Revised: 06/23/2020 Document Reviewed: 06/23/2020 Elsevier Patient Education  2023 [employer]

## 2022-08-04 NOTE — Progress Notes (Signed)
Patrick Morris , [birth year], 72 y.o., male MRN: [id number] Patient Care Team    Relationship Specialty Notifications Start End  Saguier, Kateri Mc PCP - General Physician Assistant  01/22/14     Chief Complaint  Patient presents with   Eye Drainage    Swelling, right eye; 1 week      Subjective: Patrick Morris is a 72 y.o. Pt presents for an OV with complaints of right eye swelling of 1 week duration.  Associated symptoms include drainage. Patient denies any visual changes.  He does endorse trying to squeeze a pimple around his eye prior to onset.  He denies any visual changes.     06/09/2022    2:52 PM 04/11/2021    1:51 PM 12/22/2020    1:57 PM 10/09/2019    3:16 PM 08/07/2017   11:18 AM  Depression screen PHQ 2/9  Decreased Interest 0 0 0 0 0  Down, Depressed, Hopeless 0 0 0 0 0  PHQ - 2 Score 0 0 0 0 0    Allergies  Allergen Reactions   Aspirin Other (See Comments)    Acid reflux and drooling   Social History   Social History Narrative   Not on file   Past Medical History:  Diagnosis Date   Atypical atrial flutter    BPH (benign prostatic hyperplasia)    his specialist is recommending surgery.   Cataract    Chronic anticoagulation    Colitis    COPD (chronic obstructive pulmonary disease)    COPD exacerbation 01/22/2014   Coronary artery calcification    Ear infection    GERD (gastroesophageal reflux disease)    Hard of hearing    Salmonella    Sinus infection    Past Surgical History:  Procedure Laterality Date   BRONCHIAL BIOPSY  08/10/2020   Procedure: BRONCHIAL BIOPSIES;  Surgeon: Josephine Igo, DO;  Location: [facility];  Service: Pulmonary;;   BRONCHIAL BRUSHINGS  08/10/2020   Procedure: BRONCHIAL BRUSHINGS;  Surgeon: Josephine Igo, DO;  Location: [facility];  Service: Pulmonary;;   BRONCHIAL NEEDLE ASPIRATION BIOPSY  08/10/2020   Procedure: BRONCHIAL NEEDLE ASPIRATION BIOPSIES;  Surgeon: Josephine Igo, DO;  Location: [facility];   Service: Pulmonary;;   BRONCHIAL WASHINGS  08/10/2020   Procedure: BRONCHIAL WASHINGS;  Surgeon: Josephine Igo, DO;  Location: [facility];  Service: Pulmonary;;   COLONOSCOPY     ENDOBRONCHIAL ULTRASOUND  08/10/2020   Procedure: ENDOBRONCHIAL ULTRASOUND;  Surgeon: Josephine Igo, DO;  Location: [facility];  Service: Pulmonary;;   EYE SURGERY Right    FIDUCIAL MARKER PLACEMENT  08/10/2020   Procedure: FIDUCIAL MARKER PLACEMENT;  Surgeon: Josephine Igo, DO;  Location: [facility];  Service: Pulmonary;;   IR RADIOLOGIST EVAL & MGMT  05/07/2019   IR RADIOLOGIST EVAL & MGMT  05/27/2019   IR RADIOLOGIST EVAL & MGMT  10/23/2019   IR RADIOLOGIST EVAL & MGMT  01/15/2020   VIDEO BRONCHOSCOPY WITH ENDOBRONCHIAL NAVIGATION Right 08/10/2020   Procedure: VIDEO BRONCHOSCOPY WITH ENDOBRONCHIAL NAVIGATION;  Surgeon: Josephine Igo, DO;  Location: [facility];  Service: Pulmonary;  Laterality: Right;   Family History  Problem Relation Age of Onset   Hyperlipidemia Father    Stroke Father    Alcohol abuse Father    Throat cancer Brother    Lymphoma Brother    Allergies as of 08/04/2022       Reactions   Aspirin Other (See Comments)  Acid reflux and drooling        Medication List        Accurate as of August 04, 2022 12:08 PM. If you have any questions, ask your nurse or doctor.          STOP taking these medications    amoxicillin-clavulanate 875-125 MG tablet Commonly known as: AUGMENTIN Stopped by: Felix Pacinienee Jerricka Carvey, DO   benzonatate 100 MG capsule Commonly known as: TESSALON Stopped by: Felix Pacinienee Teyona Nichelson, DO   cyclobenzaprine 5 MG tablet Commonly known as: FLEXERIL Stopped by: Felix Pacinienee Berley Gambrell, DO   nystatin-triamcinolone cream Commonly known as: MYCOLOG II Stopped by: Felix Pacinienee Donevan Biller, DO   predniSONE 10 MG tablet Commonly known as: DELTASONE Stopped by: Felix Pacinienee Edlyn Rosenburg, DO       TAKE these medications    acetaminophen 500 MG tablet Commonly known as: TYLENOL Take 1,000 mg  by mouth every 6 (six) hours as needed for mild pain or moderate pain. For pain   clonazePAM 0.5 MG tablet Commonly known as: KLONOPIN TAKE 1 TABLET BY MOUTH ONCE DAILY AS NEEDED FOR ANXIETY   doxazosin 4 MG tablet Commonly known as: CARDURA Take 4 mg by mouth at bedtime.   erythromycin ophthalmic ointment Place 1 Application into the right eye at bedtime for 7 days. Started by: Felix Pacinienee Zlaty Alexa, DO   finasteride 5 MG tablet Commonly known as: PROSCAR Take 1 tablet (5 mg total) by mouth at bedtime.   fluticasone 50 MCG/ACT nasal spray Commonly known as: FLONASE Place 2 sprays into both nostrils daily.   fluticasone 50 MCG/ACT nasal spray Commonly known as: FLONASE Use 2 spray(s) in each nostril once daily   fluticasone-salmeterol 250-50 MCG/ACT Aepb Commonly known as: ADVAIR INHALE 1 DOSE BY MOUTH IN THE MORNING AND 1 AT BEDTIME   gabapentin 100 MG capsule Commonly known as: NEURONTIN TAKE 1 CAPSULE BY MOUTH THREE TIMES DAILY   metoprolol tartrate 25 MG tablet Commonly known as: LOPRESSOR Take 1 tablet (25 mg total) by mouth 2 (two) times daily.   rosuvastatin 10 MG tablet Commonly known as: CRESTOR Take 1 tablet (10 mg total) by mouth daily.        All past medical history, surgical history, allergies, family history, immunizations andmedications were updated in the EMR today and reviewed under the history and medication portions of their EMR.     ROS Negative, with the exception of above mentioned in HPI   Objective:  BP 122/69   Pulse (!) 59   Temp 97.9 F (36.6 C)   Wt 202 lb (91.6 kg)   SpO2 96%   BMI 32.60 kg/m  Body mass index is 32.6 kg/m. Physical Exam Vitals and nursing note reviewed.  Constitutional:      General: He is not in acute distress.    Appearance: Normal appearance. He is not ill-appearing, toxic-appearing or diaphoretic.  HENT:     Head: Normocephalic and atraumatic.  Eyes:     General: No scleral icterus.       Right eye:  Hordeolum present. No discharge.        Left eye: No discharge.     Extraocular Movements: Extraocular movements intact.     Conjunctiva/sclera: Conjunctivae normal.     Pupils: Pupils are equal, round, and reactive to light.   Skin:    General: Skin is warm and dry.     Coloration: Skin is not jaundiced or pale.     Findings: No rash.  Neurological:     Mental Status:  He is alert and oriented to person, place, and time. Mental status is at baseline.  Psychiatric:        Mood and Affect: Mood normal.        Behavior: Behavior normal.        Thought Content: Thought content normal.        Judgment: Judgment normal.      No results found. No results found. No results found for this or any previous visit (from the past 24 hour(s)).  Assessment/Plan: Patrick Morris is a 72 y.o. male present for OV for  Hordeolum externum of right lower eyelid J&J (no tears) shampoo cleanse daily.  Warm compresses BID Erythromycin OP ont qhs x7 d F/u 1 week with pcp if not improving, sooner if worsening.  Reviewed expectations re: course of current medical issues. Discussed self-management of symptoms. Outlined signs and symptoms indicating need for more acute intervention. Patient verbalized understanding and all questions were answered. Patient received an After-Visit Summary.    No orders of the defined types were placed in this encounter.  Meds ordered this encounter  Medications   erythromycin ophthalmic ointment    Sig: Place 1 Application into the right eye at bedtime for 7 days.    Dispense:  3.5 g    Refill:  1   Referral Orders  No referral(s) requested today     Note is dictated utilizing voice recognition software. Although note has been proof read prior to signing, occasional typographical errors still can be missed. If any questions arise, please do not hesitate to call for verification.   electronically signed by:  Felix Pacini, DO  [facility] [place]

## 2022-08-07 DIAGNOSIS — L820 Inflamed seborrheic keratosis: Secondary | ICD-10-CM | POA: Diagnosis not present

## 2022-08-07 DIAGNOSIS — L570 Actinic keratosis: Secondary | ICD-10-CM | POA: Diagnosis not present

## 2022-08-11 ENCOUNTER — Ambulatory Visit: Payer: 59 | Admitting: Medical

## 2022-08-11 ENCOUNTER — Telehealth: Payer: Self-pay

## 2022-08-11 NOTE — Telephone Encounter (Signed)
No show letter

## 2022-08-15 ENCOUNTER — Ambulatory Visit (INDEPENDENT_AMBULATORY_CARE_PROVIDER_SITE_OTHER): Payer: 59 | Admitting: Medical

## 2022-08-15 ENCOUNTER — Ambulatory Visit (HOSPITAL_BASED_OUTPATIENT_CLINIC_OR_DEPARTMENT_OTHER)
Admission: RE | Admit: 2022-08-15 | Discharge: 2022-08-15 | Disposition: A | Discharge status: Home / Self Care | Payer: 59 | Source: Ambulatory Visit | Attending: Medical | Admitting: Medical

## 2022-08-15 VITALS — BP 108/66 | HR 106 | Resp 18 | Ht 66.0 in | Wt 195.0 lb

## 2022-08-15 DIAGNOSIS — M8580 Other specified disorders of bone density and structure, unspecified site: Secondary | ICD-10-CM | POA: Diagnosis not present

## 2022-08-15 DIAGNOSIS — G8929 Other chronic pain: Secondary | ICD-10-CM | POA: Diagnosis not present

## 2022-08-15 DIAGNOSIS — M5440 Lumbago with sciatica, unspecified side: Secondary | ICD-10-CM

## 2022-08-15 DIAGNOSIS — F419 Anxiety disorder, unspecified: Secondary | ICD-10-CM

## 2022-08-15 DIAGNOSIS — M545 Low back pain, unspecified: Secondary | ICD-10-CM | POA: Diagnosis not present

## 2022-08-15 DIAGNOSIS — M8588 Other specified disorders of bone density and structure, other site: Secondary | ICD-10-CM | POA: Diagnosis not present

## 2022-08-15 MED ORDER — ALENDRONATE SODIUM 5 MG PO TABS: 5.0000 mg | ORAL_TABLET | Freq: Every day | ORAL | 11 refills | Status: DC

## 2022-08-15 MED ORDER — CLONAZEPAM 0.5 MG PO TABS: 0.5000 mg | ORAL_TABLET | Freq: Two times a day (BID) | ORAL | 3 refills | Status: DC | PRN

## 2022-08-15 NOTE — Progress Notes (Signed)
Subjective:    Patient ID: Patrick Morris, male    DOB: 11-13-1950, 72 y.o.   MRN: [id number]  HPI Pt in for follow up on anxiety. Pt had been on clonazepam in past just once daily in morning. Later in the day feeling more anxious late in the day. Pt is worried about various health problems. He is recently worried about bph with urinary difficulty. He is scheduled to have surgery next month Aug 30, 2022.  Worse back pain recenty lumbar area. Sometimes pain runs toward rt leg. No leg weakness. No incotinence. Mild pain since 2021. Last year pain is worse.   Review of Systems  Constitutional:  Negative for diaphoresis and fatigue.  Respiratory:  Negative for cough, chest tightness and wheezing.   Cardiovascular:  Negative for chest pain and palpitations.  Gastrointestinal:  Negative for abdominal pain.  Genitourinary:  Negative for dysuria and flank pain.  Musculoskeletal:  Positive for back pain.  Neurological:  Negative for dizziness, seizures, syncope, weakness and headaches.  Hematological:  Negative for adenopathy. Does not bruise/bleed easily.  Psychiatric/Behavioral:  Negative for behavioral problems, sleep disturbance and suicidal ideas. The patient is nervous/anxious.    Past Medical History:  Diagnosis Date   Atypical atrial flutter    BPH (benign prostatic hyperplasia)    his specialist is recommending surgery.   Cataract    Chronic anticoagulation    Colitis    COPD (chronic obstructive pulmonary disease)    COPD exacerbation 01/22/2014   Coronary artery calcification    Ear infection    GERD (gastroesophageal reflux disease)    Hard of hearing    Salmonella    Sinus infection      Social History   Socioeconomic History   Marital status: Married    Spouse name: Not on file   Number of children: Not on file   Years of education: Not on file   Highest education level: Not on file  Occupational History   Not on file  Tobacco Use   Smoking status: Former     Packs/day: 1.00    Years: 55.00    Additional pack years: 0.00    Total pack years: 55.00    Types: Cigarettes   Smokeless tobacco: Never   Tobacco comments:    down to 0.5ppd  Vaping Use   Vaping Use: Never used  Substance and Sexual Activity   Alcohol use: Never   Drug use: No   Sexual activity: Not Currently    Birth control/protection: None  Other Topics Concern   Not on file  Social History Narrative   Not on file   Social Determinants of Health   Financial Resource Strain: Low Risk  (04/11/2021)   Overall Financial Resource Strain (CARDIA)    Difficulty of Paying Living Expenses: Not hard at all  Food Insecurity: No Food Insecurity (04/11/2021)   Hunger Vital Sign    Worried About Running Out of Food in the Last Year: Never true    Ran Out of Food in the Last Year: Never true  Transportation Needs: No Transportation Needs (04/11/2021)   PRAPARE - Administrator, Civil Service (Medical): No    Lack of Transportation (Non-Medical): No  Physical Activity: Inactive (04/11/2021)   Exercise Vital Sign    Days of Exercise per Week: 0 days    Minutes of Exercise per Session: 0 min  Stress: No Stress Concern Present (04/11/2021)   [employer] of Occupational Health - Occupational Stress  Questionnaire    Feeling of Stress : Not at all  Social Connections: Moderately Isolated (04/11/2021)   Social Connection and Isolation Panel [NHANES]    Frequency of Communication with Friends and Family: More than three times a week    Frequency of Social Gatherings with Friends and Family: More than three times a week    Attends Religious Services: Never    Database administrator or Organizations: No    Attends Banker Meetings: Never    Marital Status: Married  Catering manager Violence: Not on file    Past Surgical History:  Procedure Laterality Date   BRONCHIAL BIOPSY  08/10/2020   Procedure: BRONCHIAL BIOPSIES;  Surgeon: Josephine Igo, DO;   Location: [facility];  Service: Pulmonary;;   BRONCHIAL BRUSHINGS  08/10/2020   Procedure: BRONCHIAL BRUSHINGS;  Surgeon: Josephine Igo, DO;  Location: [facility];  Service: Pulmonary;;   BRONCHIAL NEEDLE ASPIRATION BIOPSY  08/10/2020   Procedure: BRONCHIAL NEEDLE ASPIRATION BIOPSIES;  Surgeon: Josephine Igo, DO;  Location: [facility];  Service: Pulmonary;;   BRONCHIAL WASHINGS  08/10/2020   Procedure: BRONCHIAL WASHINGS;  Surgeon: Josephine Igo, DO;  Location: [facility];  Service: Pulmonary;;   COLONOSCOPY     ENDOBRONCHIAL ULTRASOUND  08/10/2020   Procedure: ENDOBRONCHIAL ULTRASOUND;  Surgeon: Josephine Igo, DO;  Location: [facility];  Service: Pulmonary;;   EYE SURGERY Right    FIDUCIAL MARKER PLACEMENT  08/10/2020   Procedure: FIDUCIAL MARKER PLACEMENT;  Surgeon: Josephine Igo, DO;  Location: [facility];  Service: Pulmonary;;   IR RADIOLOGIST EVAL & MGMT  05/07/2019   IR RADIOLOGIST EVAL & MGMT  05/27/2019   IR RADIOLOGIST EVAL & MGMT  10/23/2019   IR RADIOLOGIST EVAL & MGMT  01/15/2020   VIDEO BRONCHOSCOPY WITH ENDOBRONCHIAL NAVIGATION Right 08/10/2020   Procedure: VIDEO BRONCHOSCOPY WITH ENDOBRONCHIAL NAVIGATION;  Surgeon: Josephine Igo, DO;  Location: [facility];  Service: Pulmonary;  Laterality: Right;    Family History  Problem Relation Age of Onset   Hyperlipidemia Father    Stroke Father    Alcohol abuse Father    Throat cancer Brother    Lymphoma Brother     Allergies  Allergen Reactions   Aspirin Other (See Comments)    Acid reflux and drooling    Current Outpatient Medications on File Prior to Visit  Medication Sig Dispense Refill   acetaminophen (TYLENOL) 500 MG tablet Take 1,000 mg by mouth every 6 (six) hours as needed for mild pain or moderate pain. For pain     clonazePAM (KLONOPIN) 0.5 MG tablet TAKE 1 TABLET BY MOUTH ONCE DAILY AS NEEDED FOR ANXIETY 30 tablet 0   doxazosin (CARDURA) 4 MG tablet Take 4 mg by mouth at bedtime.      finasteride (PROSCAR) 5 MG tablet Take 1 tablet (5 mg total) by mouth at bedtime. 30 tablet 2   fluticasone (FLONASE) 50 MCG/ACT nasal spray Place 2 sprays into both nostrils daily. 16 g 1   fluticasone (FLONASE) 50 MCG/ACT nasal spray Use 2 spray(s) in each nostril once daily 16 g 0   fluticasone-salmeterol (ADVAIR) 250-50 MCG/ACT AEPB INHALE 1 DOSE BY MOUTH IN THE MORNING AND 1 AT BEDTIME 180 each 1   gabapentin (NEURONTIN) 100 MG capsule TAKE 1 CAPSULE BY MOUTH THREE TIMES DAILY 90 capsule 0   metoprolol tartrate (LOPRESSOR) 25 MG tablet Take 1 tablet (25 mg total) by mouth 2 (two) times daily. 180 tablet 2  rosuvastatin (CRESTOR) 10 MG tablet Take 1 tablet (10 mg total) by mouth daily. 90 tablet 3   No current facility-administered medications on file prior to visit.    BP 108/66   Pulse (!) 106   Resp 18   Ht  (1.676 m)   Wt 195 lb (88.5 kg)   SpO2 95%   BMI 31.47 kg/m        Objective:   Physical Exam  General Appearance- Not in acute distress.   Chest and Lung Exam Auscultation: Breath sounds:-Normal. Clear even and unlabored. Adventitious sounds:- No Adventitious sounds.  Cardiovascular Auscultation:Rythm - Regular, rate and rythm. Heart Sounds -Normal heart sounds.  Abdomen Inspection:-Inspection Normal.  Palpation/Perucssion: Palpation and Percussion of the abdomen reveal- Non Tender, No Rebound tenderness, No rigidity(Guarding) and No Palpable abdominal masses.  Liver:-Normal.  Spleen:- Normal.   Back Mid lumbar spine tenderness to palpation. Pain on straight leg lift. Pain on lateral movements and flexion/extension of the spine.  Lower ext neurologic  L5-S1 sensation intact bilaterally. Normal patellar reflexes bilaterally. No foot drop bilaterally.       Assessment & Plan:   Patient Instructions  For worsening anxiety particularly with upcoming prostate surgery. Will increase clonazepam to twice daily prn anxiety. Update your  contract.  For low back pain getting updated xray to compare to prior lumbar xray in 2021. For back pain can use low dose tylenol in combination with low dose ibuprofen pending xray review.  Follow up August or sooner if needed.   Esperanza Richters, PA-C

## 2022-08-15 NOTE — Patient Instructions (Addendum)
For worsening anxiety particularly with upcoming prostate surgery. Will increase clonazepam to twice daily prn anxiety. Update your contract.  For low back pain getting updated xray to compare to prior lumbar xray in 2021. For back pain can use low dose tylenol in combination with low dose ibuprofen pending xray review.  Follow up August or sooner if needed.

## 2022-08-22 ENCOUNTER — Telehealth: Payer: Self-pay | Admitting: Medical

## 2022-08-22 MED ORDER — ALENDRONATE SODIUM 35 MG PO TABS: 35.0000 mg | ORAL_TABLET | ORAL | 11 refills | Status: DC

## 2022-08-22 NOTE — Addendum Note (Signed)
Addended by: Gwenevere Abbot on: 08/22/2022 12:40 PM   Modules accepted: Orders

## 2022-08-22 NOTE — Telephone Encounter (Signed)
Melinda with [facility] said that they cannot get Fosmax 5 mg so provider needs to send in 35 mg or 70 mg instead.

## 2022-08-23 DIAGNOSIS — Z87891 Personal history of nicotine dependence: Secondary | ICD-10-CM | POA: Diagnosis not present

## 2022-08-23 DIAGNOSIS — I2510 Atherosclerotic heart disease of native coronary artery without angina pectoris: Secondary | ICD-10-CM | POA: Diagnosis not present

## 2022-08-23 DIAGNOSIS — N138 Other obstructive and reflux uropathy: Secondary | ICD-10-CM | POA: Diagnosis not present

## 2022-08-23 DIAGNOSIS — Z01812 Encounter for preprocedural laboratory examination: Secondary | ICD-10-CM | POA: Diagnosis not present

## 2022-08-23 DIAGNOSIS — I10 Essential (primary) hypertension: Secondary | ICD-10-CM | POA: Diagnosis not present

## 2022-08-23 DIAGNOSIS — K219 Gastro-esophageal reflux disease without esophagitis: Secondary | ICD-10-CM | POA: Diagnosis not present

## 2022-08-25 ENCOUNTER — Other Ambulatory Visit: Payer: Self-pay | Admitting: Medical

## 2022-08-30 ENCOUNTER — Telehealth: Payer: Self-pay | Admitting: Medical

## 2022-08-30 DIAGNOSIS — R339 Retention of urine, unspecified: Secondary | ICD-10-CM | POA: Diagnosis not present

## 2022-08-30 DIAGNOSIS — I2510 Atherosclerotic heart disease of native coronary artery without angina pectoris: Secondary | ICD-10-CM | POA: Diagnosis not present

## 2022-08-30 DIAGNOSIS — J449 Chronic obstructive pulmonary disease, unspecified: Secondary | ICD-10-CM | POA: Diagnosis not present

## 2022-08-30 DIAGNOSIS — Z8616 Personal history of COVID-19: Secondary | ICD-10-CM | POA: Diagnosis not present

## 2022-08-30 DIAGNOSIS — Z85528 Personal history of other malignant neoplasm of kidney: Secondary | ICD-10-CM | POA: Diagnosis not present

## 2022-08-30 DIAGNOSIS — N138 Other obstructive and reflux uropathy: Secondary | ICD-10-CM | POA: Diagnosis not present

## 2022-08-30 DIAGNOSIS — R338 Other retention of urine: Secondary | ICD-10-CM | POA: Diagnosis not present

## 2022-08-30 DIAGNOSIS — Z79899 Other long term (current) drug therapy: Secondary | ICD-10-CM | POA: Diagnosis not present

## 2022-08-30 DIAGNOSIS — I484 Atypical atrial flutter: Secondary | ICD-10-CM | POA: Diagnosis not present

## 2022-08-30 DIAGNOSIS — Z888 Allergy status to other drugs, medicaments and biological substances status: Secondary | ICD-10-CM | POA: Diagnosis not present

## 2022-08-30 DIAGNOSIS — I10 Essential (primary) hypertension: Secondary | ICD-10-CM | POA: Diagnosis not present

## 2022-08-30 DIAGNOSIS — K219 Gastro-esophageal reflux disease without esophagitis: Secondary | ICD-10-CM | POA: Diagnosis not present

## 2022-08-30 DIAGNOSIS — N323 Diverticulum of bladder: Secondary | ICD-10-CM | POA: Diagnosis not present

## 2022-08-30 NOTE — Telephone Encounter (Signed)
Copied from CRM [id number]. Topic: Medicare AWV >> Aug 30, 2022  9:30 AM Payton Doughty wrote: Reason for CRM: Called patient to schedule Medicare Annual Wellness Visit (AWV). Left message for patient to call back and schedule Medicare Annual Wellness Visit (AWV).  Last date of AWV: 06/21/2021  Please schedule an appointment at any time with Donne Anon, CMA  .  If any questions, please contact me.  Thank you ,  hospitalKathy Harris-Coley; Care Guide Ambulatory Clinical Support [place] [facility] Direct Dial: [phone]

## 2022-08-31 DIAGNOSIS — N138 Other obstructive and reflux uropathy: Secondary | ICD-10-CM | POA: Diagnosis not present

## 2022-09-04 ENCOUNTER — Telehealth (HOSPITAL_BASED_OUTPATIENT_CLINIC_OR_DEPARTMENT_OTHER): Payer: Self-pay

## 2022-09-14 DIAGNOSIS — N323 Diverticulum of bladder: Secondary | ICD-10-CM | POA: Diagnosis not present

## 2022-09-14 DIAGNOSIS — N138 Other obstructive and reflux uropathy: Secondary | ICD-10-CM | POA: Diagnosis not present

## 2022-09-14 DIAGNOSIS — D49519 Neoplasm of unspecified behavior of unspecified kidney: Secondary | ICD-10-CM | POA: Diagnosis not present

## 2022-09-15 DIAGNOSIS — M47816 Spondylosis without myelopathy or radiculopathy, lumbar region: Secondary | ICD-10-CM | POA: Diagnosis not present

## 2022-10-02 ENCOUNTER — Other Ambulatory Visit: Payer: Self-pay | Admitting: Medical

## 2022-10-02 NOTE — Telephone Encounter (Signed)
Pt stated that pharmacy is out of Clonazepam 0.5mg  and is requesting the pt to get a different prescription so that they can fill it for them. Please advise as soon as possible what prescription can be taken instead pt is without meds. Please send to pharmacy at [address], [place] [address]. Please call pt [phone] when the prescription is ready.

## 2022-10-03 MED ORDER — CLONAZEPAM 0.5 MG PO TABS: 0.5000 mg | ORAL_TABLET | Freq: Two times a day (BID) | ORAL | 0 refills | Status: DC | PRN

## 2022-10-03 NOTE — Telephone Encounter (Signed)
Pharmacy loaded and pt hasn't had a script of clonazepam since April 2024 , no current one at pharmacy

## 2022-10-03 NOTE — Telephone Encounter (Signed)
Rx xanax sent to pharmacy

## 2022-10-03 NOTE — Addendum Note (Signed)
Addended by: Gwenevere Abbot on: 10/03/2022 05:37 PM   Modules accepted: Orders

## 2022-10-13 ENCOUNTER — Ambulatory Visit (INDEPENDENT_AMBULATORY_CARE_PROVIDER_SITE_OTHER): Payer: 59 | Admitting: Family Medicine

## 2022-10-13 ENCOUNTER — Encounter: Payer: Self-pay | Admitting: Family Medicine

## 2022-10-13 VITALS — BP 120/80 | HR 67 | Temp 98.3°F | Ht 67.0 in | Wt 194.0 lb

## 2022-10-13 DIAGNOSIS — N4889 Other specified disorders of penis: Secondary | ICD-10-CM

## 2022-10-13 DIAGNOSIS — L308 Other specified dermatitis: Secondary | ICD-10-CM

## 2022-10-13 MED ORDER — PREDNISONE 20 MG PO TABS: 40.0000 mg | ORAL_TABLET | Freq: Every day | ORAL | 0 refills | Status: AC

## 2022-10-13 NOTE — Progress Notes (Signed)
Chief Complaint  Patient presents with   Rash    Penis swelling Rash on arm and hand    Patrick Morris is a 72 y.o. male here for a skin complaint.  Here with his spouse.  Duration: 1 day Location: hands, forearms, penis Pruritic?  Yes Painful? Yes Drainage? No New soaps/lotions/topicals/detergents? No Sick contacts? No Other associated symptoms: felt like something bit him Therapies tried thus far: none  Past Medical History:  Diagnosis Date   Atypical atrial flutter (HCC)    BPH (benign prostatic hyperplasia)    his specialist is recommending surgery.   Cataract    Chronic anticoagulation    Colitis    COPD (chronic obstructive pulmonary disease) (HCC)    COPD exacerbation (HCC) 01/22/2014   Coronary artery calcification    Ear infection    GERD (gastroesophageal reflux disease)    Hard of hearing    Salmonella    Sinus infection     BP 120/80 (BP Location: Left Arm, Patient Position: Sitting, Cuff Size: Normal)   Pulse 67   Temp 98.3 F (36.8 C) (Oral)   Ht 5\' 7"  (1.702 m)   Wt 194 lb (88 kg)   SpO2 96%   BMI 30.38 kg/m  Gen: awake, alert, appearing stated age Lungs: No accessory muscle use Skin: The left distal shaft of his penis has soft tissue swelling without a tourniquet effect.  He is circumcised. No drainage, erythema, TTP, fluctuance, excoriation.  Over the palms of his hands, there are patches of hyperemic tissue without scaling, fluctuance, TTP, excoriation, or drainage.  A small patch of this on his anterior distal forearm as well. Psych: Normal affect and mood  Pruritic dermatitis - Plan: predniSONE (DELTASONE) 20 MG tablet  Penile swelling - Plan: predniSONE (DELTASONE) 20 MG tablet  Considering contact dermatitis given that it was on his hands and a specific portion of his penis which he may have touched to urinate. He will reach out to his urologist as a contingency plan.  5-day prednisone burst 40 mg daily.  ER if things get worse around his  penis. F/u prn. The patient and his spouse voiced understanding and agreement to the plan.  Jilda Roche [place], DO 10/13/22 11:54 AM

## 2022-10-13 NOTE — Patient Instructions (Signed)
Try not to scratch as this can make things worse. Avoid scented products while dealing with this. You may resume when the itchiness resolves. Cold/cool compresses can help.   Claritin (loratadine), Allegra (fexofenadine), Zyrtec (cetirizine) which is also equivalent to Xyzal (levocetirizine); these are listed in order from weakest to strongest. Generic, and therefore cheaper, options are in the parentheses.   There are available OTC, and the generic versions, which may be cheaper, are in parentheses. Show this to a pharmacist if you have trouble finding any of these items.  Please schedule an appointment with your urologist and keep it if this does not get better.   If things get worse with the penis, go to the ER.   Let us know if you need anything.

## 2022-11-06 NOTE — Progress Notes (Unsigned)
Cardiology Office Note:  .   Date:  11/07/2022  ID:  Patrick Morris, DOB Sep 16, 1950, MRN [id number] PCP: Marisue Brooklyn  [facility] Providers Cardiologist:  None    History of Present Illness: .   Patrick Morris is a 72 y.o. male with a past medical history of atypical atrial flutter, COPD, coronary artery calcification, GERD, BPH, history of GI bleed.  03/29/2020 long-term monitor-average heart rate 76 bpm, no pauses greater than 3 seconds, episodes of slow atrial flutter, atrial tachycardia, triggered events 03/22/2020 Lexiscan without evidence of ischemia  Most recently evaluated by Dr. Dulce Sellar on 02/22/2022, was doing well from a cardiac perspective, although he was in atrial flutter.  He did have ongoing hematuria and was in the process of an upcoming prostate surgery so he is not anticoagulated.  He presents today accompanied by his wife for follow-up of his atrial flutter.  He has undergone surgery on his prostate, no longer having hematuria. He denies chest pain, palpitations, dyspnea, pnd, orthopnea, n, v, dizziness, syncope, edema, weight gain, or early satiety.   ROS: negative  Studies Reviewed: Marland Kitchen   EKG Interpretation Date/Time:  Tuesday November 07 2022 11:24:44 EDT Ventricular Rate:  108 PR Interval:    QRS Duration:  94 QT Interval:  384 QTC Calculation: 514 R Axis:   22  Text Interpretation: Accelerated Junctional rhythm Cannot rule out Anterior infarct , age undetermined Abnormal ECG When compared with ECG of 14-Nov-2020 14:15, PREVIOUS ECG IS PRESENT Confirmed by Wallis Bamberg [phone]) on 11/07/2022 4:35:01 PM    Cardiac Studies & Procedures     STRESS TESTS  MYOCARDIAL PERFUSION IMAGING 03/22/2020  Narrative  Nuclear stress EF: 58%.  There was no ST segment deviation noted during stress.  There is a small defect of mild severity present in the apex location. The defect is non-reversible. In the setting of normal LVF, this is consistent with  diaphragmatic attenuation artifact. No ischemia.  This is a low risk study.  The left ventricular ejection fraction is normal (55-65%).     MONITORS  LONG TERM MONITOR (3-14 DAYS) 03/29/2020  Narrative A ZIO monitor was performed for 7 days beginning 03/10/2020 to assess for rapid heart rhythm.  Baseline rhythm is sinus with minimum, average and maximum heart rates of 52, 76 and 124 bpm.  There are no pauses of 3 seconds or greater and no episodes of second or third-degree AV nodal block or sinus node exit block.  Overall supraventricular ectopy was rare. There were episodes of slow atrial flutter atrial tachycardia with a cycle length of 0.32 seconds with variable conduction.  There was persistent SVT at the same rate noted 31% of the time.  I suspect this represents one-to-one conduction. There are no episodes of atrial fibrillation  There were no triggered or diary events  Patient was initiated on beta-blocker with an appointment to see EP.           Risk Assessment/Calculations:    CHA2DS2-VASc Score = 2   This indicates a 2.2% annual risk of stroke. The patient's score is based upon: CHF History: 0 HTN History: 0 Diabetes History: 0 Stroke History: 0 Vascular Disease History: 1 Age Score: 1 Gender Score: 0            Physical Exam:   VS:  BP 122/82 (BP Location: Left Arm, Patient Position: Sitting, Cuff Size: Normal)   Pulse 94   Ht 5\' 6"  (1.676 m)   Wt 193 lb (87.5  kg)   SpO2 94%   BMI 31.15 kg/m    Wt Readings from Last 3 Encounters:  11/07/22 193 lb (87.5 kg)  10/13/22 194 lb (88 kg)  08/15/22 195 lb (88.5 kg)    GEN: Well nourished, well developed in no acute distress NECK: No JVD; No carotid bruits CARDIAC: RRR, no murmurs, rubs, gallops RESPIRATORY:  Clear to auscultation without rales, wheezing or rhonchi  ABDOMEN: Soft, non-tender, non-distended EXTREMITIES:  No edema; No deformity   ASSESSMENT AND PLAN: .   Atypical atrial  flutter/hypercoagulable state- today he is an accelerated junctional rhythm. Will arrange for a live monitor x 2 weeks.  CHA2DS2-VASc score of 2, had not been anticoagulated secondary to hematuria, however this is now resolved.  He endorses some bleeding related to hemorrhoids.  Will check CBC BMET today, if no abnormalities plan on starting DOAC, if there are any signs of anemia will defer starting DOAC until he is evaluated by GI.  We discussed the Watchman procedure however he is against this, would not want to pursue this.  Continue metoprolol 25 mg twice daily.  Coronary artery calcifications-noted on CT imaging, Stable with no anginal symptoms. No indication for ischemic evaluation.  Continue metoprolol 25 mg twice daily, continue Crestor 10 mg daily.    Hematuria-this was related to his prostate, he has had no further incidents of bleeding.  HLD -most recent LDL marginally elevated at 74, will repeat LFT and FLPs, plan to increase his Crestor.        Dispo: CBC, BMET, live monitor x 2 weeks,   Signed, Flossie Dibble, NP

## 2022-11-07 ENCOUNTER — Telehealth: Payer: Self-pay | Admitting: Cardiology

## 2022-11-07 ENCOUNTER — Encounter: Payer: Self-pay | Admitting: Cardiology

## 2022-11-07 ENCOUNTER — Ambulatory Visit: Payer: 59 | Attending: Cardiology | Admitting: Cardiology

## 2022-11-07 VITALS — BP 122/82 | HR 94 | Ht 66.0 in | Wt 193.0 lb

## 2022-11-07 DIAGNOSIS — E785 Hyperlipidemia, unspecified: Secondary | ICD-10-CM

## 2022-11-07 DIAGNOSIS — I484 Atypical atrial flutter: Secondary | ICD-10-CM

## 2022-11-07 DIAGNOSIS — D6859 Other primary thrombophilia: Secondary | ICD-10-CM | POA: Diagnosis not present

## 2022-11-07 DIAGNOSIS — R319 Hematuria, unspecified: Secondary | ICD-10-CM | POA: Diagnosis not present

## 2022-11-07 DIAGNOSIS — Z79899 Other long term (current) drug therapy: Secondary | ICD-10-CM | POA: Diagnosis not present

## 2022-11-07 DIAGNOSIS — I2510 Atherosclerotic heart disease of native coronary artery without angina pectoris: Secondary | ICD-10-CM

## 2022-11-07 NOTE — Patient Instructions (Signed)
Medication Instructions:  Your physician recommends that you continue on your current medications as directed. Please refer to the Current Medication list given to you today.  *If you need a refill on your cardiac medications before your next appointment, please call your pharmacy*   Lab Work: Your physician recommends that you return for lab work in: Today for CMP and CBC Come back in 8 weeks for Liver Function and Fasting Lipid Panel Lab opens at 8am. You DO NOT NEED an appointment. Best time to come is between 8am and 12noon and between 1:30 and 4:30. If you have been asked to fast for your blood work please have nothing to eat or drink after midnight. You may have water.   If you have labs (blood work) drawn today and your tests are completely normal, you will receive your results only by: MyChart Message (if you have MyChart) OR A paper copy in the mail If you have any lab test that is abnormal or we need to change your treatment, we will call you to review the results.   Testing/Procedures: NONE   Follow-Up: At [facility], you and your health needs are our priority.  As part of our continuing mission to provide you with exceptional heart care, we have created designated Provider Care Teams.  These Care Teams include your primary Cardiologist (physician) and Advanced Practice Providers (APPs -  Physician Assistants and Nurse Practitioners) who all work together to provide you with the care you need, when you need it.  We recommend signing up for the patient portal called "MyChart".  Sign up information is provided on this After Visit Summary.  MyChart is used to connect with patients for Virtual Visits (Telemedicine).  Patients are able to view lab/test results, encounter notes, upcoming appointments, etc.  Non-urgent messages can be sent to your provider as well.   To learn more about what you can do with MyChart, go to [email or web address].    Your next appointment:    6 month(s)  Provider:   Norman Herrlich, MD    Other Instructions

## 2022-11-07 NOTE — Telephone Encounter (Signed)
Spoke with pt and let him know that Dr. Tomie China feels he needs to wear a live heart monitor. Pt was agreeable to this and is scheduled for Wed. 7/10 @9 :30

## 2022-11-08 ENCOUNTER — Telehealth: Payer: Self-pay | Admitting: *Deleted

## 2022-11-08 ENCOUNTER — Telehealth: Payer: Self-pay

## 2022-11-08 ENCOUNTER — Other Ambulatory Visit: Payer: Self-pay

## 2022-11-08 ENCOUNTER — Ambulatory Visit: Payer: 59 | Attending: Cardiology

## 2022-11-08 DIAGNOSIS — I484 Atypical atrial flutter: Secondary | ICD-10-CM | POA: Diagnosis not present

## 2022-11-08 DIAGNOSIS — D6859 Other primary thrombophilia: Secondary | ICD-10-CM

## 2022-11-08 LAB — CBC WITH DIFFERENTIAL/PLATELET
Basophils Absolute: 0 x10E3/uL (ref 0.0–0.2)
Basos: 1 %
EOS (ABSOLUTE): 0.6 x10E3/uL — ABNORMAL HIGH (ref 0.0–0.4)
Eos: 10 %
Hematocrit: 42.3 % (ref 37.5–51.0)
Hemoglobin: 14.2 g/dL (ref 13.0–17.7)
Immature Grans (Abs): 0 x10E3/uL (ref 0.0–0.1)
Immature Granulocytes: 0 %
Lymphocytes Absolute: 1.6 x10E3/uL (ref 0.7–3.1)
Lymphs: 25 %
MCH: 29.6 pg (ref 26.6–33.0)
MCHC: 33.6 g/dL (ref 31.5–35.7)
MCV: 88 fL (ref 79–97)
Monocytes Absolute: 0.5 x10E3/uL (ref 0.1–0.9)
Monocytes: 8 %
Neutrophils Absolute: 3.7 x10E3/uL (ref 1.4–7.0)
Neutrophils: 56 %
Platelets: 290 x10E3/uL (ref 150–450)
RBC: 4.79 x10E6/uL (ref 4.14–5.80)
RDW: 12.8 % (ref 11.6–15.4)
WBC: 6.5 x10E3/uL (ref 3.4–10.8)

## 2022-11-08 LAB — COMPREHENSIVE METABOLIC PANEL WITH GFR
ALT: 12 IU/L (ref 0–44)
AST: 14 IU/L (ref 0–40)
Albumin: 4.1 g/dL (ref 3.8–4.8)
Alkaline Phosphatase: 116 IU/L (ref 44–121)
BUN/Creatinine Ratio: 15 (ref 10–24)
BUN: 10 mg/dL (ref 8–27)
Bilirubin Total: 0.7 mg/dL (ref 0.0–1.2)
CO2: 21 mmol/L (ref 20–29)
Calcium: 9.1 mg/dL (ref 8.6–10.2)
Chloride: 106 mmol/L (ref 96–106)
Creatinine, Ser: 0.68 mg/dL — ABNORMAL LOW (ref 0.76–1.27)
Globulin, Total: 2.5 g/dL (ref 1.5–4.5)
Glucose: 93 mg/dL (ref 70–99)
Potassium: 4 mmol/L (ref 3.5–5.2)
Sodium: 141 mmol/L (ref 134–144)
Total Protein: 6.6 g/dL (ref 6.0–8.5)
eGFR: 98 mL/min/1.73

## 2022-11-08 MED ORDER — APIXABAN 5 MG PO TABS: 5.0000 mg | ORAL_TABLET | Freq: Two times a day (BID) | ORAL | 3 refills | Status: DC

## 2022-11-08 NOTE — Telephone Encounter (Signed)
Pt came in for ZIO AT as ordered by Wallis Bamberg, NP.

## 2022-11-08 NOTE — Telephone Encounter (Signed)
Left message for pt to call back  °

## 2022-11-08 NOTE — Telephone Encounter (Signed)
-----   Message from Flossie Dibble, NP sent at 11/08/2022  8:00 AM EDT ----- Blood count shows no anemia, which is good. Chemistry is good as well. Per our plan, lets start Eliquis 5 mg twice daily. Return in 3 weeks for repeat CBC.

## 2022-11-09 DIAGNOSIS — I484 Atypical atrial flutter: Secondary | ICD-10-CM | POA: Diagnosis not present

## 2022-11-13 NOTE — Telephone Encounter (Signed)
Gave pt blood work results. He asked about monitor and did he just need to mail it back; stated he had to wear it for a while and then it would fall off due to sweating and then he would re apply it. Said he wore it as long as he could and will mail it back as instructed. Agreed to come in 3 weeks for CBC.

## 2022-11-14 DIAGNOSIS — N138 Other obstructive and reflux uropathy: Secondary | ICD-10-CM | POA: Diagnosis not present

## 2022-11-14 DIAGNOSIS — C674 Malignant neoplasm of posterior wall of bladder: Secondary | ICD-10-CM | POA: Diagnosis not present

## 2022-11-16 ENCOUNTER — Telehealth: Payer: Self-pay | Admitting: *Deleted

## 2022-11-16 NOTE — Telephone Encounter (Signed)
Spoke with pt about live [place] he has been wearing. [employer] sent email stating they are not getting transmissions from patient and unable to reach pt on phone. Pt stated monitor had fallen off and he was unable to get it back on. Instructed pt to call company and see if they can assist him on reapplying it, if not then send it back in the envelope along with the log book. Pt verbalized understanding and had no further questions.

## 2022-12-07 ENCOUNTER — Encounter: Payer: Self-pay | Admitting: Medical

## 2022-12-18 ENCOUNTER — Telehealth: Payer: Self-pay | Admitting: Medical

## 2022-12-18 ENCOUNTER — Telehealth: Payer: Self-pay | Admitting: *Deleted

## 2022-12-18 ENCOUNTER — Other Ambulatory Visit: Payer: Self-pay | Admitting: Medical

## 2022-12-18 NOTE — Telephone Encounter (Signed)
Requesting: clonazepam 0.5mg   Contract:08/29/22 UDS:12/27/21 Last Visit:  08/15/22 Next Visit: None Last Refill: 10/03/22 #60 and 0RF   Please Advise

## 2022-12-18 NOTE — Telephone Encounter (Signed)
Pt came in asking for new rx of CLONazepam 0.5 so that insurance will cover the medication.Pt asks to be called and advised on next steps/ what they need to do to get this.

## 2022-12-18 NOTE — Telephone Encounter (Signed)
Dr. Tomie China would like pt to wear another monitor since 1st one fell off after 3 days. Pt said he is agreeable to try another one. Will have one sent to his home. Instructed pt to call and make appt if he has any problems applying monitor.

## 2022-12-19 NOTE — Telephone Encounter (Signed)
Rx sent 

## 2022-12-26 ENCOUNTER — Telehealth: Payer: Self-pay | Admitting: Cardiology

## 2022-12-26 ENCOUNTER — Telehealth: Payer: Self-pay

## 2022-12-26 ENCOUNTER — Telehealth: Payer: Self-pay | Admitting: *Deleted

## 2022-12-26 NOTE — Telephone Encounter (Signed)
Kelli calling from [employer] calling to report 1st episode of A-flutter on Zio AT- Today at 10:57 AM 78 BPM lasting > 1 min. Sent to [employer] for review.

## 2022-12-26 NOTE — Telephone Encounter (Signed)
Pt received new monitor in the mail and came in to have it put on. I asked the pt to try and wear it at least 7 days. Pt will come back in 1 week to get the CBC that JCW ordered.

## 2022-12-26 NOTE — Telephone Encounter (Signed)
Message sent to Wallis Bamberg, NP

## 2022-12-26 NOTE — Telephone Encounter (Signed)
Patrick Morris with [employer] calling with critical monitor results

## 2023-01-02 DIAGNOSIS — D6859 Other primary thrombophilia: Secondary | ICD-10-CM | POA: Diagnosis not present

## 2023-01-03 LAB — LIPID PANEL
Chol/HDL Ratio: 4 ratio (ref 0.0–5.0)
Cholesterol, Total: 109 mg/dL (ref 100–199)
HDL: 27 mg/dL — ABNORMAL LOW
LDL Chol Calc (NIH): 64 mg/dL (ref 0–99)
Triglycerides: 90 mg/dL (ref 0–149)
VLDL Cholesterol Cal: 18 mg/dL (ref 5–40)

## 2023-01-03 LAB — HEPATIC FUNCTION PANEL
ALT: 14 IU/L (ref 0–44)
AST: 15 IU/L (ref 0–40)
Albumin: 4.2 g/dL (ref 3.8–4.8)
Alkaline Phosphatase: 99 IU/L (ref 44–121)
Bilirubin Total: 0.5 mg/dL (ref 0.0–1.2)
Bilirubin, Direct: 0.18 mg/dL (ref 0.00–0.40)
Total Protein: 7.1 g/dL (ref 6.0–8.5)

## 2023-01-03 LAB — CBC
Hematocrit: 43.5 % (ref 37.5–51.0)
Hemoglobin: 13.7 g/dL (ref 13.0–17.7)
MCH: 29 pg (ref 26.6–33.0)
MCHC: 31.5 g/dL (ref 31.5–35.7)
MCV: 92 fL (ref 79–97)
Platelets: 240 x10E3/uL (ref 150–450)
RBC: 4.72 x10E6/uL (ref 4.14–5.80)
RDW: 12.5 % (ref 11.6–15.4)
WBC: 5.6 x10E3/uL (ref 3.4–10.8)

## 2023-01-04 ENCOUNTER — Other Ambulatory Visit: Payer: Self-pay | Admitting: Medical

## 2023-01-11 ENCOUNTER — Encounter: Payer: Self-pay | Admitting: Cardiology

## 2023-01-11 ENCOUNTER — Ambulatory Visit: Payer: 59 | Attending: Cardiology | Admitting: Cardiology

## 2023-01-11 VITALS — BP 122/80 | HR 82 | Ht 67.0 in | Wt 199.0 lb

## 2023-01-11 DIAGNOSIS — I484 Atypical atrial flutter: Secondary | ICD-10-CM

## 2023-01-11 DIAGNOSIS — I2584 Coronary atherosclerosis due to calcified coronary lesion: Secondary | ICD-10-CM | POA: Diagnosis not present

## 2023-01-11 DIAGNOSIS — Z7901 Long term (current) use of anticoagulants: Secondary | ICD-10-CM | POA: Diagnosis not present

## 2023-01-11 DIAGNOSIS — J441 Chronic obstructive pulmonary disease with (acute) exacerbation: Secondary | ICD-10-CM | POA: Diagnosis not present

## 2023-01-11 DIAGNOSIS — E782 Mixed hyperlipidemia: Secondary | ICD-10-CM

## 2023-01-11 DIAGNOSIS — R0609 Other forms of dyspnea: Secondary | ICD-10-CM

## 2023-01-11 DIAGNOSIS — I2510 Atherosclerotic heart disease of native coronary artery without angina pectoris: Secondary | ICD-10-CM

## 2023-01-11 HISTORY — DX: Mixed hyperlipidemia: E78.2

## 2023-01-11 HISTORY — DX: Other forms of dyspnea: R06.09

## 2023-01-11 MED ORDER — RIVAROXABAN 20 MG PO TABS: 20.0000 mg | ORAL_TABLET | Freq: Every day | ORAL | 3 refills | Status: DC

## 2023-01-11 MED ORDER — RIVAROXABAN 20 MG PO TABS: 20.0000 mg | ORAL_TABLET | Freq: Every day | ORAL | Status: DC

## 2023-01-11 NOTE — Patient Instructions (Signed)
Medication Instructions:  Your physician has recommended you make the following change in your medication:   Stop Eliquis  Start Xarelto 20 mg daily with the largest meal of the day.  *If you need a refill on your cardiac medications before your next appointment, please call your pharmacy*   Lab Work: Your physician recommends that you have a BMP today in the office.   If you have labs (blood work) drawn today and your tests are completely normal, you will receive your results only by: MyChart Message (if you have MyChart) OR A paper copy in the mail If you have any lab test that is abnormal or we need to change your treatment, we will call you to review the results.   Testing/Procedures:   Your cardiac CT will be scheduled at one of the below locations:   [facility] [address] [place], [place] [address] [phone]  If scheduled at [facility], please arrive at the [facility] and Children's Entrance (Entrance C2) of [facility] 30 minutes prior to test start time. You can use the FREE valet parking offered at entrance C (encouraged to control the heart rate for the test)  Proceed to the [facility] Radiology Department (first floor) to check-in and test prep.  All radiology patients and guests should use entrance C2 at [facility], accessed from [facility], even though the hospital's physical address listed is [address].     Please follow these instructions carefully (unless otherwise directed):  Hold all erectile dysfunction medications at least 3 days (72 hrs) prior to test. (Ie viagra, cialis, sildenafil, tadalafil, etc) We will administer nitroglycerin during this exam.   On the Night Before the Test: Be sure to Drink plenty of water. Do not consume any caffeinated/decaffeinated beverages or chocolate 12 hours prior to your test. Do not take any antihistamines 12 hours prior to your test.  On the  Day of the Test: Drink plenty of water until 1 hour prior to the test. Do not eat any food 1 hour prior to test. You may take your regular medications prior to the test.  Take metoprolol (Toprol XL) two hours prior to test. Take 75 mg the day of your scan.  After the Test: Drink plenty of water. After receiving IV contrast, you may experience a mild flushed feeling. This is normal. On occasion, you may experience a mild rash up to 24 hours after the test. This is not dangerous. If this occurs, you can take Benadryl 25 mg and increase your fluid intake. If you experience trouble breathing, this can be serious. If it is severe call 911 IMMEDIATELY. If it is mild, please call our office. If you take any of these medications: Glipizide/Metformin, Avandament, Glucavance, please do not take 48 hours after completing test unless otherwise instructed.  We will call to schedule your test 2-4 weeks out understanding that some insurance companies will need an authorization prior to the service being performed.   For non-scheduling related questions, please contact the cardiac imaging nurse navigator should you have any questions/concerns: Rockwell Alexandria, Cardiac Imaging Nurse Navigator Larey Brick, Cardiac Imaging Nurse Navigator [facility] Direct Office Dial: [phone]   For scheduling needs, including cancellations and rescheduling, please call [place], [phone].   Your next appointment:   1 month(s)  The format for your next appointment:   In Person  Provider:   Belva Crome, MD   Other Instructions Cardiac  CT Angiogram A cardiac CT angiogram is a procedure to look at the heart and the area around the heart. It may be done to help find the cause of chest pains or other symptoms of heart disease. During this procedure, a substance called contrast dye is injected into the blood vessels in the area to be checked. A large X-ray machine, called a CT  scanner, then takes detailed pictures of the heart and the surrounding area. The procedure is also sometimes called a coronary CT angiogram, coronary artery scanning, or CTA. A cardiac CT angiogram allows the health care provider to see how well blood is flowing to and from the heart. The health care provider will be able to see if there are any problems, such as: Blockage or narrowing of the coronary arteries in the heart. Fluid around the heart. Signs of weakness or disease in the muscles, valves, and tissues of the heart. Tell a health care provider about: Any allergies you have. This is especially important if you have had a previous allergic reaction to contrast dye. All medicines you are taking, including vitamins, herbs, eye drops, creams, and over-the-counter medicines. Any blood disorders you have. Any surgeries you have had. Any medical conditions you have. Whether you are pregnant or may be pregnant. Any anxiety disorders, chronic pain, or other conditions you have that may increase your stress or prevent you from lying still. What are the risks? Generally, this is a safe procedure. However, problems may occur, including: Bleeding. Infection. Allergic reactions to medicines or dyes. Damage to other structures or organs. Kidney damage from the contrast dye that is used. Increased risk of cancer from radiation exposure. This risk is low. Talk with your health care provider about: The risks and benefits of testing. How you can receive the lowest dose of radiation. What happens before the procedure? Wear comfortable clothing and remove any jewelry, glasses, dentures, and hearing aids. Follow instructions from your health care provider about eating and drinking. This may include: For 12 hours before the procedure -- avoid caffeine. This includes tea, coffee, soda, energy drinks, and diet pills. Drink plenty of water or other fluids that do not have caffeine in them. Being well  hydrated can prevent complications. For 4-6 hours before the procedure -- stop eating and drinking. The contrast dye can cause nausea, but this is less likely if your stomach is empty. Ask your health care provider about changing or stopping your regular medicines. This is especially important if you are taking diabetes medicines, blood thinners, or medicines to treat problems with erections (erectile dysfunction). What happens during the procedure?  Hair on your chest may need to be removed so that small sticky patches called electrodes can be placed on your chest. These will transmit information that helps to monitor your heart during the procedure. An IV will be inserted into one of your veins. You might be given a medicine to control your heart rate during the procedure. This will help to ensure that good images are obtained. You will be asked to lie on an exam table. This table will slide in and out of the CT machine during the procedure. Contrast dye will be injected into the IV. You might feel warm, or you may get a metallic taste in your mouth. You will be given a medicine called nitroglycerin. This will relax or dilate the arteries in your heart. The table that you are lying on will move into the CT machine tunnel for the scan. The  person running the machine will give you instructions while the scans are being done. You may be asked to: Keep your arms above your head. Hold your breath. Stay very still, even if the table is moving. When the scanning is complete, you will be moved out of the machine. The IV will be removed. The procedure may vary among health care providers and hospitals. What can I expect after the procedure? After your procedure, it is common to have: A metallic taste in your mouth from the contrast dye. A feeling of warmth. A headache from the nitroglycerin. Follow these instructions at home: Take over-the-counter and prescription medicines only as told by your  health care provider. If you are told, drink enough fluid to keep your urine pale yellow. This will help to flush the contrast dye out of your body. Most people can return to their normal activities right after the procedure. Ask your health care provider what activities are safe for you. It is up to you to get the results of your procedure. Ask your health care provider, or the department that is doing the procedure, when your results will be ready. Keep all follow-up visits as told by your health care provider. This is important. Contact a health care provider if: You have any symptoms of allergy to the contrast dye. These include: Shortness of breath. Rash or hives. A racing heartbeat. Summary A cardiac CT angiogram is a procedure to look at the heart and the area around the heart. It may be done to help find the cause of chest pains or other symptoms of heart disease. During this procedure, a large X-ray machine, called a CT scanner, takes detailed pictures of the heart and the surrounding area after a contrast dye has been injected into blood vessels in the area. Ask your health care provider about changing or stopping your regular medicines before the procedure. This is especially important if you are taking diabetes medicines, blood thinners, or medicines to treat erectile dysfunction. If you are told, drink enough fluid to keep your urine pale yellow. This will help to flush the contrast dye out of your body. This information is not intended to replace advice given to you by your health care provider. Make sure you discuss any questions you have with your health care provider. Document Revised: 12/11/2018 Document Reviewed: 12/11/2018 Elsevier Patient Education  [employer].  Echocardiogram An echocardiogram is a test that uses sound waves to make images of your heart. This way of making images is often called ultrasound. The images from this test can help find out many things  about your heart, including: The size and shape of your heart. The strength of your heart muscle and how well it's working. The size, thickness, and movement of your heart's walls. How your heart valves are working. Problems such as: A tumor or a growth from an infection around the heart valves. Areas of heart muscle that aren't working well because of poor blood flow or injury from a heart attack. An aneurysm. This is a weak or damaged part of an artery wall. An artery is a blood vessel. Tell a health care provider about: Any allergies you have. All medicines you're taking, including vitamins, herbs, eye drops, creams, and over-the-counter medicines. Any bleeding problems you have. Any surgeries you've had. Any medical problems you have. Whether you're pregnant or may be pregnant. What are the risks? Your health care provider will talk with you about risks. These may include an allergic  reaction to IV dye that may be used during the test. What happens before the test? You don't need to do anything to get ready for this test. You may eat and drink normally. What happens during the test?  You'll take off your clothes from the waist up and put on a hospital gown. Sticky patches called electrodes may be placed on your chest. These will be connected to a machine that monitors your heart rate and rhythm. You'll lie down on a table for the exam. A wand covered in gel will be moved over your chest. Sound waves from the wand will go to your heart and bounce back--or "echo" back. The sound waves will go to a computer that uses them to make images of your heart. The images can be viewed on a monitor. The images will also be recorded on the computer so your provider can look at them later. You may be asked to change positions or hold your breath for a short time. This makes it easier to get different views or better views of your heart. In some cases, you may be given a dye through an IV. The IV is  put into one of your veins. This dye can make the areas of your heart easier to see. The procedure may vary among providers and hospitals. What can I expect after the test? You may return to your normal diet, activities, and medicines unless your provider tells you not to. If an IV was placed for the test, it will be removed. It's up to you to get the results of your test. Ask your provider, or the department that's doing the test, when your results will be ready. This information is not intended to replace advice given to you by your health care provider. Make sure you discuss any questions you have with your health care provider. Document Revised: 06/16/2022 Document Reviewed: 06/16/2022 Elsevier Patient Education  2024 [employer].

## 2023-01-11 NOTE — Progress Notes (Signed)
Cardiology Office Note:    Date:  01/11/2023   ID:  [name], DOB December 19, 1950, MRN [id number]  PCP:  Esperanza Richters, PA-C  Cardiologist:  Garwin Brothers, MD   Referring MD: Esperanza Richters, PA-C    ASSESSMENT:    1. Atypical atrial flutter (HCC)   2. Coronary artery calcification   3. Chronic anticoagulation   4. Mixed dyslipidemia   5. Dyspnea on exertion    PLAN:    In order of problems listed above:  Dyspnea on exertion: Coronary artery calcification: I discussed my findings with the patient at extensive length and discussed evaluation with CT coronary angiography with FFR he is agreeable.  This will also help me assess antiarrhythmic therapy for the discussion I have mentioned next. Significant paroxysms of supraventricular tachycardia and atrial flutter: I discussed this with him at length.  If his CT coronary angiography is unremarkable I will initiate medication such as flecainide.  I am not sure about him being a candidate for medication such as amiodarone in view of significant pulmonary pathology.  I will also refer him to pulmonologist since he has not seen one for a long time.  He has history of COPD and lung cancer. Paroxysmal atrial flutter:I discussed with the patient atrial flutter, disease process. Management and therapy including rate and rhythm control, anticoagulation benefits and potential risks were discussed extensively with the patient. Patient had multiple questions which were answered to patient's satisfaction. Mixed dyslipidemia: On statin therapy.  Lipids reviewed.  These are followed by primary care. Cardiac murmur: Echocardiogram will be done to assess murmur heard on auscultation. Patient will be seen in follow-up appointment in 4 weeks or earlier if the patient has any concerns.    Medication Adjustments/Labs and Tests Ordered: Current medicines are reviewed at length with the patient today.  Concerns regarding medicines are outlined above.  No  orders of the defined types were placed in this encounter.  No orders of the defined types were placed in this encounter.    No chief complaint on file.    History of Present Illness:    Patrick Morris is a 72 y.o. male.  Patient has past medical history of coronary artery calcification, essential hypertension, mixed dyslipidemia, significant SVT on recent monitoring and paroxysmal atrial flutter also seen on recent monitoring.  He has history of COPD and lung cancer and tells me that he has had remission.  He denies any chest pain orthopnea or PND but gives history of dyspnea on exertion.  At the time of my evaluation, the patient is alert awake oriented and in no distress.  Past Medical History:  Diagnosis Date   Atypical atrial flutter (HCC)    BPH (benign prostatic hyperplasia)    his specialist is recommending surgery.   Cataract    Cellulitis of foot 01/04/2021   Chronic anticoagulation    Colitis    COPD (chronic obstructive pulmonary disease) (HCC)    COPD exacerbation (HCC) 01/22/2014   Coronary artery calcification    Cough 01/22/2014   Ear infection    GERD (gastroesophageal reflux disease)    Hard of hearing    Laceration of left hand without foreign body 01/04/2021   Nodule of middle lobe of right lung 07/29/2020   Added automatically from request for surgery [id number]     Pain in left foot 09/20/2016   Pain in left leg 09/20/2016   Pneumothorax 08/10/2020   Pneumothorax, right 08/10/2020   Salmonella    Sinus  infection    Skin lesion 01/22/2014   Wellness examination 02/24/2014    Past Surgical History:  Procedure Laterality Date   BRONCHIAL BIOPSY  08/10/2020   Procedure: BRONCHIAL BIOPSIES;  Surgeon: Josephine Igo, DO;  Location: [facility];  Service: Pulmonary;;   BRONCHIAL BRUSHINGS  08/10/2020   Procedure: BRONCHIAL BRUSHINGS;  Surgeon: Josephine Igo, DO;  Location: [facility];  Service: Pulmonary;;   BRONCHIAL NEEDLE ASPIRATION BIOPSY  08/10/2020    Procedure: BRONCHIAL NEEDLE ASPIRATION BIOPSIES;  Surgeon: Josephine Igo, DO;  Location: [facility];  Service: Pulmonary;;   BRONCHIAL WASHINGS  08/10/2020   Procedure: BRONCHIAL WASHINGS;  Surgeon: Josephine Igo, DO;  Location: [facility];  Service: Pulmonary;;   COLONOSCOPY     ENDOBRONCHIAL ULTRASOUND  08/10/2020   Procedure: ENDOBRONCHIAL ULTRASOUND;  Surgeon: Josephine Igo, DO;  Location: [facility];  Service: Pulmonary;;   EYE SURGERY Right    FIDUCIAL MARKER PLACEMENT  08/10/2020   Procedure: FIDUCIAL MARKER PLACEMENT;  Surgeon: Josephine Igo, DO;  Location: [facility];  Service: Pulmonary;;   IR RADIOLOGIST EVAL & MGMT  05/07/2019   IR RADIOLOGIST EVAL & MGMT  05/27/2019   IR RADIOLOGIST EVAL & MGMT  10/23/2019   IR RADIOLOGIST EVAL & MGMT  01/15/2020   VIDEO BRONCHOSCOPY WITH ENDOBRONCHIAL NAVIGATION Right 08/10/2020   Procedure: VIDEO BRONCHOSCOPY WITH ENDOBRONCHIAL NAVIGATION;  Surgeon: Josephine Igo, DO;  Location: [facility];  Service: Pulmonary;  Laterality: Right;    Current Medications: Current Meds  Medication Sig   acetaminophen (TYLENOL) 500 MG tablet Take 1,000 mg by mouth every 6 (six) hours as needed for mild pain or moderate pain. For pain   alendronate (FOSAMAX) 35 MG tablet Take 1 tablet (35 mg total) by mouth every 7 (seven) days. Take with a full glass of water on an empty stomach.   apixaban (ELIQUIS) 5 MG TABS tablet Take 1 tablet (5 mg total) by mouth 2 (two) times daily. (Patient taking differently: Take 5 mg by mouth daily.)   clonazePAM (KLONOPIN) 0.5 MG tablet Take 1 tablet by mouth twice daily as needed for anxiety   fluticasone (FLONASE) 50 MCG/ACT nasal spray Use 2 spray(s) in each nostril once daily   fluticasone-salmeterol (ADVAIR) 250-50 MCG/ACT AEPB INHALE 1 DOSE BY MOUTH IN THE MORNING AND 1 DOSE AT BEDTIME   gabapentin (NEURONTIN) 100 MG capsule TAKE 1 CAPSULE BY MOUTH THREE TIMES DAILY   metoprolol tartrate (LOPRESSOR) 25 MG  tablet Take 1 tablet (25 mg total) by mouth 2 (two) times daily.   rosuvastatin (CRESTOR) 10 MG tablet Take 1 tablet (10 mg total) by mouth daily.     Allergies:   Aspirin   Social History   Socioeconomic History   Marital status: Married    Spouse name: Not on file   Number of children: Not on file   Years of education: Not on file   Highest education level: Not on file  Occupational History   Not on file  Tobacco Use   Smoking status: Former    Current packs/day: 1.00    Average packs/day: 1 pack/day for 55.0 years (55.0 ttl pk-yrs)    Types: Cigarettes   Smokeless tobacco: Never   Tobacco comments:    down to 0.5ppd  Vaping Use   Vaping status: Never Used  Substance and Sexual Activity   Alcohol use: Never   Drug use: No   Sexual activity: Not Currently    Birth control/protection: None  Other  Topics Concern   Not on file  Social History Narrative   Not on file   Social Determinants of Health   Financial Resource Strain: Low Risk  (04/11/2021)   Overall Financial Resource Strain (CARDIA)    Difficulty of Paying Living Expenses: Not hard at all  Food Insecurity: No Food Insecurity (11/22/2021)   Received from [facility], [facility]   Hunger Vital Sign    Worried About Running Out of Food in the Last Year: Never true    Ran Out of Food in the Last Year: Never true  Transportation Needs: No Transportation Needs (04/11/2021)   PRAPARE - Administrator, Civil Service (Medical): No    Lack of Transportation (Non-Medical): No  Physical Activity: Inactive (04/11/2021)   Exercise Vital Sign    Days of Exercise per Week: 0 days    Minutes of Exercise per Session: 0 min  Stress: No Stress Concern Present (02/20/2022)   Received from [facility], [facility] - Occupational Stress Questionnaire    Feeling of Stress : Not at all  Social Connections: Unknown (09/02/2021)   Received from [facility], [facility]   Social Network    Social Network: Not on file     Family History: The patient's family history includes Alcohol abuse in his father; Hyperlipidemia in his father; Lymphoma in his brother; Stroke in his father; Throat cancer in his brother.  ROS:   Please see the history of present illness.    All other systems reviewed and are negative.  EKGs/Labs/Other Studies Reviewed:    The following studies were reviewed today: I discussed findings of significant paroxysmal SVT and atrial flutter on the monitoring.   Recent Labs: 11/07/2022: BUN 10; Creatinine, Ser 0.68; Potassium 4.0; Sodium 141 01/02/2023: ALT 14; Hemoglobin 13.7; Platelets 240  Recent Lipid Panel    Component Value Date/Time   CHOL 109 01/02/2023 1115   TRIG 90 01/02/2023 1115   HDL 27 (L) 01/02/2023 1115   CHOLHDL 4.0 01/02/2023 1115   CHOLHDL 4 09/11/2017 0843   VLDL 16.2 09/11/2017 0843   LDLCALC 64 01/02/2023 1115    Physical Exam:    VS:  BP 122/80   Pulse 82   Ht 5\' 7"  (1.702 m)   Wt 199 lb 0.6 oz (90.3 kg)   SpO2 94%   BMI 31.17 kg/m     Wt Readings from Last 3 Encounters:  01/11/23 199 lb 0.6 oz (90.3 kg)  11/07/22 193 lb (87.5 kg)  10/13/22 194 lb (88 kg)     GEN: Patient is in no acute distress HEENT: Normal NECK: No JVD; No carotid bruits LYMPHATICS: No lymphadenopathy CARDIAC: Hear sounds regular, 2/6 systolic murmur at the apex. RESPIRATORY:  Clear to auscultation without rales, wheezing or rhonchi  ABDOMEN: Soft, non-tender, non-distended MUSCULOSKELETAL:  No edema; No deformity  SKIN: Warm and dry NEUROLOGIC:  Alert and oriented x 3 PSYCHIATRIC:  Normal affect   Signed, Garwin Brothers, MD  01/11/2023 1:36 PM    [facility]

## 2023-01-12 ENCOUNTER — Other Ambulatory Visit: Payer: Self-pay | Admitting: Medical

## 2023-01-12 LAB — BASIC METABOLIC PANEL
BUN/Creatinine Ratio: 18 (ref 10–24)
BUN: 13 mg/dL (ref 8–27)
CO2: 22 mmol/L (ref 20–29)
Calcium: 8.7 mg/dL (ref 8.6–10.2)
Chloride: 107 mmol/L — ABNORMAL HIGH (ref 96–106)
Creatinine, Ser: 0.71 mg/dL — ABNORMAL LOW (ref 0.76–1.27)
Glucose: 85 mg/dL (ref 70–99)
Potassium: 4 mmol/L (ref 3.5–5.2)
Sodium: 141 mmol/L (ref 134–144)
eGFR: 96 mL/min/{1.73_m2} (ref >59)

## 2023-01-15 ENCOUNTER — Other Ambulatory Visit: Payer: Self-pay | Admitting: Cardiology

## 2023-01-15 NOTE — Progress Notes (Unsigned)
Cardiology Office Note:    Date:  01/16/2023   ID:  [name], DOB 1950/05/23, MRN [id number]  PCP:  Esperanza Richters, PA-C  Cardiologist:  Norman Herrlich, MD    Referring MD: Esperanza Richters, PA-C    ASSESSMENT:    1. Atypical atrial flutter (HCC)   2. Chronic anticoagulation   3. Coronary artery calcification   4. Hyperlipidemia LDL goal <70    PLAN:    In order of problems listed above:  Ultimately I think he will need an antiarrhythmic drug, I asked him to purchase the mobile Kardia start to screen his heart rhythm every day and after further evaluation his cardiac CTA and echo we can make arrangements to start antiarrhythmic drug therapy with flecainide if he does not have severe obstructive CAD Continue Eliquis stop Xarelto Continue his lipid-lowering therapy He requested to see the pulmonary group at the med [facility] office.   Next appointment: 6 weeks and follow-up   Medication Adjustments/Labs and Tests Ordered: Current medicines are reviewed at length with the patient today.  Concerns regarding medicines are outlined above.  No orders of the defined types were placed in this encounter.  No orders of the defined types were placed in this encounter.    History of Present Illness:    Patrick Morris is a 72 y.o. male with a hx of atypical atrial flutter with chronic anticoagulation coronary artery calcification on CT scan without evidence of ischemia on myocardial perfusion imaging non-small cell lung cancer treated with XRT emphysema cholelithiasis and aortic atherosclerosis.  Last seen by me 02/22/2022.  Recent monitor showed atrial flutter average rate 83 bpm and frequent episodes of SVT.  Compliance with diet, lifestyle and medications: Yes  Multiple issues First he does not want to go to [facility] to have procedures or testing performed Second he prefers to take Eliquis over Xarelto Third he needs to do testing that was scheduled cardiac CTA at  [facility] and echocardiogram in this office. For today having little or no symptomatology not having episodes of significant palpitation chest pain syncope shortness of breath edema. Past Medical History:  Diagnosis Date   Atypical atrial flutter (HCC)    BPH (benign prostatic hyperplasia)    his specialist is recommending surgery.   Cataract    Cellulitis of foot 01/04/2021   Chronic anticoagulation    Colitis    COPD (chronic obstructive pulmonary disease) (HCC)    COPD exacerbation (HCC) 01/22/2014   Coronary artery calcification    Cough 01/22/2014   Ear infection    GERD (gastroesophageal reflux disease)    Hard of hearing    Laceration of left hand without foreign body 01/04/2021   Nodule of middle lobe of right lung 07/29/2020   Added automatically from request for surgery [id number]     Pain in left foot 09/20/2016   Pain in left leg 09/20/2016   Pneumothorax 08/10/2020   Pneumothorax, right 08/10/2020   Salmonella    Sinus infection    Skin lesion 01/22/2014   Wellness examination 02/24/2014    Current Medications: Current Meds  Medication Sig   acetaminophen (TYLENOL) 500 MG tablet Take 1,000 mg by mouth every 6 (six) hours as needed for mild pain or moderate pain. For pain   alendronate (FOSAMAX) 35 MG tablet Take 1 tablet (35 mg total) by mouth every 7 (seven) days. Take with a full glass of water on an empty stomach.   clonazePAM (KLONOPIN) 0.5 MG tablet Take  1 tablet by mouth twice daily as needed for anxiety   fluticasone (FLONASE) 50 MCG/ACT nasal spray Use 2 spray(s) in each nostril once daily   fluticasone-salmeterol (ADVAIR) 250-50 MCG/ACT AEPB INHALE 1 DOSE BY MOUTH IN THE MORNING AND 1 DOSE AT BEDTIME   gabapentin (NEURONTIN) 100 MG capsule TAKE 1 CAPSULE BY MOUTH THREE TIMES DAILY   metoprolol tartrate (LOPRESSOR) 25 MG tablet Take 1 tablet by mouth twice daily   rosuvastatin (CRESTOR) 10 MG tablet Take 1 tablet (10 mg total) by mouth daily.       EKGs/Labs/Other Studies Reviewed:    The following studies were reviewed today:  Cardiac Studies & Procedures     STRESS TESTS  MYOCARDIAL PERFUSION IMAGING 03/22/2020  Narrative  Nuclear stress EF: 58%.  There was no ST segment deviation noted during stress.  There is a small defect of mild severity present in the apex location. The defect is non-reversible. In the setting of normal LVF, this is consistent with diaphragmatic attenuation artifact. No ischemia.  This is a low risk study.  The left ventricular ejection fraction is normal (55-65%).     MONITORS  LONG TERM MONITOR-LIVE TELEMETRY (3-14 DAYS) 01/10/2023  Narrative Patch Wear Time:  10 days and 18 hours (2024-07-10T10:02:01-399 to 2024-09-03T11:15:39-0400)  Monitor 1 Patient had a min HR of 51 bpm, max HR of 167 bpm, and avg HR of 75 bpm. Predominant underlying rhythm was Sinus Rhythm. First Degree AV Block was present. 50 Supraventricular Tachycardia runs occurred, the run with the fastest interval lasting 15 beats with a max rate of 167 bpm, the longest lasting 2 hours 45 mins with an avg rate of 94 bpm. Some episodes of Supraventricular Tachycardia may be possible Atrial Tachycardia with variable block. Supraventricular Tachycardia was detected within +/- 45 seconds of symptomatic patient event(s). Isolated SVEs were rare (<1.0%), SVE Couplets were rare (<1.0%), and SVE Triplets were rare (<1.0%). Isolated VEs were rare (<1.0%), and no VE Couplets or VE Triplets were present. Inverted QRS complexes possibly due to inverted placement of device.  Monitor 2 Atrial Flutter occurred continuously (100% burden), ranging from 43-139 bpm (avg of 83 bpm). Isolated VEs were rare (<1.0%), VE Couplets were rare (<1.0%), and no VE Triplets were present. Previously notified: MD notification criteria for First Documentation of Atrial Flutter met - Notified DeDe H RN on 26 Dec 2022 at 2:17 PM ET (KT).               Recent  Labs: 01/02/2023: ALT 14; Hemoglobin 13.7; Platelets 240 01/11/2023: BUN 13; Creatinine, Ser 0.71; Potassium 4.0; Sodium 141  Recent Lipid Panel    Component Value Date/Time   CHOL 109 01/02/2023 1115   TRIG 90 01/02/2023 1115   HDL 27 (L) 01/02/2023 1115   CHOLHDL 4.0 01/02/2023 1115   CHOLHDL 4 09/11/2017 0843   VLDL 16.2 09/11/2017 0843   LDLCALC 64 01/02/2023 1115    Physical Exam:    VS:  BP 118/70 (BP Location: Right Arm, Patient Position: Sitting, Cuff Size: Normal)   Pulse 95   Ht 5\' 7"  (1.702 m)   Wt 201 lb 3.2 oz (91.3 kg)   SpO2 96%   BMI 31.51 kg/m     Wt Readings from Last 3 Encounters:  01/16/23 201 lb 3.2 oz (91.3 kg)  01/11/23 199 lb 0.6 oz (90.3 kg)  11/07/22 193 lb (87.5 kg)     GEN:  Well nourished, well developed in no acute distress HEENT: Normal NECK: No  JVD; No carotid bruits LYMPHATICS: No lymphadenopathy CARDIAC: RRR, no murmurs, rubs, gallops RESPIRATORY:  Clear to auscultation without rales, wheezing or rhonchi  ABDOMEN: Soft, non-tender, non-distended MUSCULOSKELETAL:  No edema; No deformity  SKIN: Warm and dry NEUROLOGIC:  Alert and oriented x 3 PSYCHIATRIC:  Normal affect    Signed, Norman Herrlich, MD  01/16/2023 8:54 AM    [facility]

## 2023-01-16 ENCOUNTER — Ambulatory Visit: Payer: 59 | Attending: Cardiology | Admitting: Cardiology

## 2023-01-16 ENCOUNTER — Encounter: Payer: Self-pay | Admitting: Cardiology

## 2023-01-16 ENCOUNTER — Other Ambulatory Visit: Payer: Self-pay

## 2023-01-16 VITALS — BP 118/70 | HR 95 | Ht 67.0 in | Wt 201.2 lb

## 2023-01-16 DIAGNOSIS — I2510 Atherosclerotic heart disease of native coronary artery without angina pectoris: Secondary | ICD-10-CM

## 2023-01-16 DIAGNOSIS — Z7901 Long term (current) use of anticoagulants: Secondary | ICD-10-CM | POA: Diagnosis not present

## 2023-01-16 DIAGNOSIS — E785 Hyperlipidemia, unspecified: Secondary | ICD-10-CM | POA: Diagnosis not present

## 2023-01-16 DIAGNOSIS — I2584 Coronary atherosclerosis due to calcified coronary lesion: Secondary | ICD-10-CM

## 2023-01-16 DIAGNOSIS — I484 Atypical atrial flutter: Secondary | ICD-10-CM | POA: Diagnosis not present

## 2023-01-16 MED ORDER — APIXABAN 5 MG PO TABS: 5.0000 mg | ORAL_TABLET | Freq: Two times a day (BID) | ORAL | 3 refills | Status: DC

## 2023-01-16 MED ORDER — METOPROLOL TARTRATE 100 MG PO TABS: 100.0000 mg | ORAL_TABLET | Freq: Once | ORAL | 0 refills | Status: DC

## 2023-01-16 MED ORDER — METOPROLOL TARTRATE 25 MG PO TABS: 25.0000 mg | ORAL_TABLET | Freq: Two times a day (BID) | ORAL | 3 refills | Status: DC

## 2023-01-16 NOTE — Patient Instructions (Signed)
Medication Instructions:  Your physician has recommended you make the following change in your medication:   STOP: Xarelto START: Eliquis 5 mg twice dail  *If you need a refill on your cardiac medications before your next appointment, please call your pharmacy*   Lab Work: Your physician recommends that you return for lab work in:   Labs today: BMP  If you have labs (blood work) drawn today and your tests are completely normal, you will receive your results only by: MyChart Message (if you have MyChart) OR A paper copy in the mail If you have any lab test that is abnormal or we need to change your treatment, we will call you to review the results.   Testing/Procedures: Your physician has requested that you have an echocardiogram. Echocardiography is a painless test that uses sound waves to create images of your heart. It provides your doctor with information about the size and shape of your heart and how well your heart's chambers and valves are working. This procedure takes approximately one hour. There are no restrictions for this procedure. Please do NOT wear cologne, perfume, aftershave, or lotions (deodorant is allowed). Please arrive 15 minutes prior to your appointment time.   Please follow these instructions carefully (unless otherwise directed):  An IV will be required for this test and Nitroglycerin will be given.  Hold all erectile dysfunction medications at least 3 days (72 hrs) prior to test. (Ie viagra, cialis, sildenafil, tadalafil, etc)   On the Night Before the Test: Be sure to Drink plenty of water. Do not consume any caffeinated/decaffeinated beverages or chocolate 12 hours prior to your test. Do not take any antihistamines 12 hours prior to your test.  On the Day of the Test: Drink plenty of water until 1 hour prior to the test. Do not eat any food 1 hour prior to test. You may take your regular medications prior to the test.  Take metoprolol (Lopressor)  two hours prior to test.      After the Test: Drink plenty of water. After receiving IV contrast, you may experience a mild flushed feeling. This is normal. On occasion, you may experience a mild rash up to 24 hours after the test. This is not dangerous. If this occurs, you can take Benadryl 25 mg and increase your fluid intake. If you experience trouble breathing, this can be serious. If it is severe call 911 IMMEDIATELY. If it is mild, please call our office.  We will call to schedule your test 2-4 weeks out understanding that some insurance companies will need an authorization prior to the service being performed.   Follow-Up: At [facility], you and your health needs are our priority.  As part of our continuing mission to provide you with exceptional heart care, we have created designated Provider Care Teams.  These Care Teams include your primary Cardiologist (physician) and Advanced Practice Providers (APPs -  Physician Assistants and Nurse Practitioners) who all work together to provide you with the care you need, when you need it.  We recommend signing up for the patient portal called "MyChart".  Sign up information is provided on this After Visit Summary.  MyChart is used to connect with patients for Virtual Visits (Telemedicine).  Patients are able to view lab/test results, encounter notes, upcoming appointments, etc.  Non-urgent messages can be sent to your provider as well.   To learn more about what you can do with MyChart, go to [email or web address].    Your next appointment:  6 week(s)  Provider:   Norman Herrlich, MD    Other Instructions None

## 2023-01-16 NOTE — Addendum Note (Signed)
Addended by: Roxanne Mins I on: 01/16/2023 04:50 PM   Modules accepted: Orders

## 2023-01-23 ENCOUNTER — Ambulatory Visit: Payer: 59 | Admitting: Cardiology

## 2023-01-25 DIAGNOSIS — I2510 Atherosclerotic heart disease of native coronary artery without angina pectoris: Secondary | ICD-10-CM | POA: Diagnosis not present

## 2023-01-26 ENCOUNTER — Encounter: Payer: Self-pay | Admitting: Cardiology

## 2023-02-06 ENCOUNTER — Ambulatory Visit (HOSPITAL_BASED_OUTPATIENT_CLINIC_OR_DEPARTMENT_OTHER)
Admission: RE | Admit: 2023-02-06 | Discharge: 2023-02-06 | Disposition: A | Discharge status: Home / Self Care | Payer: 59 | Source: Ambulatory Visit | Attending: Cardiology | Admitting: Cardiology

## 2023-02-06 ENCOUNTER — Telehealth: Payer: Self-pay

## 2023-02-06 DIAGNOSIS — I484 Atypical atrial flutter: Secondary | ICD-10-CM | POA: Insufficient documentation

## 2023-02-06 DIAGNOSIS — R0609 Other forms of dyspnea: Secondary | ICD-10-CM

## 2023-02-06 LAB — ECHOCARDIOGRAM COMPLETE
AR max vel: 2.35 cm2
AV Area VTI: 2.34 cm2
AV Area mean vel: 2.26 cm2
AV Mean grad: 5.5 mm[Hg]
AV Peak grad: 9.6 mm[Hg]
Ao pk vel: 1.55 m/s
Area-P 1/2: 5.2 cm2
Calc EF: 46.5 %
MV M vel: 4.73 m/s
MV Peak grad: 89.3 mm[Hg]
S' Lateral: 3.1 cm
Single Plane A2C EF: 48 %
Single Plane A4C EF: 46.5 %

## 2023-02-06 NOTE — Telephone Encounter (Signed)
Results reviewed with pt as per Dr. Hulen Shouts note.  Pt verbalized understanding and had no additional questions. Routed to PCP  Instructions reviewed with pt and his wife for [place].

## 2023-02-06 NOTE — Telephone Encounter (Signed)
-----   Message from Nurse Ladonna Snide F sent at 01/26/2023  3:13 PM EDT -----  ----- Message ----- From: Baldo Daub, MD Sent: 01/26/2023   2:48 PM EDT To: Mickie Bail Ash/Hp Triage  I phoned him study is not adequate he agrees to undergo a Lexiscan perfusion study Myoview in our office.

## 2023-02-07 NOTE — Telephone Encounter (Signed)
Pt's wife is requesting a callback at [phone] regarding Lexiscan instructions. She stated she never spoke with nurse and pt doesn't remember what was discussed. Please advise

## 2023-02-07 NOTE — Telephone Encounter (Signed)
Results reviewed with pt as per Dr. Revankar's note.  Pt verbalized understanding and had no additional questions. Routed to PCP.  

## 2023-02-08 ENCOUNTER — Telehealth (HOSPITAL_COMMUNITY): Payer: Self-pay | Admitting: *Deleted

## 2023-02-08 NOTE — Telephone Encounter (Signed)
Left detailed message on voice cell phone per DPR regarding his STRESS TEST scheduled on 02/15/23 at 11:00.

## 2023-02-15 ENCOUNTER — Ambulatory Visit: Payer: 59 | Attending: Cardiology

## 2023-02-15 DIAGNOSIS — I484 Atypical atrial flutter: Secondary | ICD-10-CM | POA: Diagnosis not present

## 2023-02-15 DIAGNOSIS — R0609 Other forms of dyspnea: Secondary | ICD-10-CM

## 2023-02-15 MED ORDER — TECHNETIUM TC 99M TETROFOSMIN IV KIT
29.5000 | PACK | Freq: Once | INTRAVENOUS | Status: AC | PRN
  Administered 2023-02-15: 29.5 via INTRAVENOUS

## 2023-02-15 MED ORDER — TECHNETIUM TC 99M TETROFOSMIN IV KIT
10.8000 | PACK | Freq: Once | INTRAVENOUS | Status: AC | PRN
  Administered 2023-02-15: 10.8 via INTRAVENOUS

## 2023-02-15 MED ORDER — REGADENOSON 0.4 MG/5ML IV SOLN
0.4000 mg | Freq: Once | INTRAVENOUS | Status: AC
Start: 2023-02-15 — End: 2023-02-15
  Administered 2023-02-15: 0.4 mg via INTRAVENOUS

## 2023-02-16 LAB — MYOCARDIAL PERFUSION IMAGING
LV dias vol: 118 mL (ref 62–150)
LV sys vol: 55 mL
Nuc Stress EF: 53 %
Peak HR: 100 {beats}/min
Rest HR: 75 {beats}/min
Rest Nuclear Isotope Dose: 10.8 mCi
SDS: 2
SRS: 1
SSS: 3
ST Depression (mm): 0 mm
Stress Nuclear Isotope Dose: 29.5 mCi
TID: 1

## 2023-02-19 ENCOUNTER — Ambulatory Visit: Payer: 59 | Admitting: Cardiology

## 2023-02-19 ENCOUNTER — Encounter: Payer: Self-pay | Admitting: Cardiology

## 2023-02-19 ENCOUNTER — Telehealth: Payer: Self-pay | Admitting: Cardiology

## 2023-02-19 NOTE — Telephone Encounter (Signed)
Please call this patient and schedule a nurse visit for an EKG this week. Thanks.   LVM for this patient to call and schedule nurse visit 02/19/23 KBL

## 2023-02-22 ENCOUNTER — Ambulatory Visit: Payer: 59 | Attending: Cardiology

## 2023-02-22 VITALS — BP 128/64 | HR 87 | Resp 18 | Ht 67.0 in | Wt 195.4 lb

## 2023-02-22 DIAGNOSIS — I484 Atypical atrial flutter: Secondary | ICD-10-CM

## 2023-02-22 MED ORDER — FLECAINIDE ACETATE 50 MG PO TABS: 50.0000 mg | ORAL_TABLET | Freq: Two times a day (BID) | ORAL | 3 refills | Status: DC

## 2023-02-22 NOTE — Progress Notes (Signed)
Nurse Visit   Date of Encounter: 02/22/2023 ID: Patrick Morris, DOB Oct 14, 1950, MRN [id number]  PCP:  Marisue Brooklyn   [facility] Providers Cardiologist:  [facility]   Visit Details   VS:  BP 128/64 (BP Location: Left Arm, Patient Position: Sitting, Cuff Size: Normal)   Pulse 87   Resp 18   Ht 5\' 7"  (1.702 m)   Wt 195 lb 6.4 oz (88.6 kg)   SpO2 94%   BMI 30.60 kg/m  , BMI Body mass index is 30.6 kg/m.  Wt Readings from Last 3 Encounters:  02/22/23 195 lb 6.4 oz (88.6 kg)  02/15/23 201 lb (91.2 kg)  01/16/23 201 lb 3.2 oz (91.3 kg)     Reason for visit: EKG Performed today: Vitals, EKG, Provider consulted:[place], and Education Changes (medications, testing, etc.) : Start Flecainide 50 mg BID Length of Visit: 15 minutes    Medications Adjustments/Labs and Tests Ordered: Orders Placed This Encounter  Procedures   EKG 12-Lead   Meds ordered this encounter  Medications   flecainide (TAMBOCOR) 50 MG tablet    Sig: Take 1 tablet (50 mg total) by mouth 2 (two) times daily.    Dispense:  180 tablet    Refill:  3     Signed, Eleonore Chiquito, RN  02/22/2023 10:27 AM

## 2023-02-22 NOTE — Patient Instructions (Signed)
Medication Instructions:  Your physician has recommended you make the following change in your medication:   Start Flecainide 50 mg twice daily. Return 1 week for repeat EKG.  *If you need a refill on your cardiac medications before your next appointment, please call your pharmacy*   Lab Work: None ordered If you have labs (blood work) drawn today and your tests are completely normal, you will receive your results only by: MyChart Message (if you have MyChart) OR A paper copy in the mail If you have any lab test that is abnormal or we need to change your treatment, we will call you to review the results.   Testing/Procedures: None ordered   Follow-Up: At [facility], you and your health needs are our priority.  As part of our continuing mission to provide you with exceptional heart care, we have created designated Provider Care Teams.  These Care Teams include your primary Cardiologist (physician) and Advanced Practice Providers (APPs -  Physician Assistants and Nurse Practitioners) who all work together to provide you with the care you need, when you need it.  We recommend signing up for the patient portal called "MyChart".  Sign up information is provided on this After Visit Summary.  MyChart is used to connect with patients for Virtual Visits (Telemedicine).  Patients are able to view lab/test results, encounter notes, upcoming appointments, etc.  Non-urgent messages can be sent to your provider as well.   To learn more about what you can do with MyChart, go to [email or web address].    Your next appointment:   As directed   The format for your next appointment:   In Person  Provider:   Norman Herrlich, MD    Other Instructions none  Important Information About Sugar

## 2023-02-27 NOTE — Progress Notes (Unsigned)
Cardiology Office Note:    Date:  02/28/2023   ID:  Patrick Morris, DOB 1951/02/18, MRN [id number]  PCP:  Esperanza Richters, PA-C  Cardiologist:  Norman Herrlich, MD    Referring MD: Esperanza Richters, PA-C    ASSESSMENT:    1. Atypical atrial flutter (HCC)   2. High risk medication use   3. Chronic anticoagulation    PLAN:    In order of problems listed above:  Continue low-dose flecainide no evidence of toxicity appears to be effective but I strongly encouraged him to get a smart watch pair with his phone self monitor and alert [place] if he is having a recurrence. In that situation I would apply a ZIO monitor for 1 week Continue his anticoagulant no recurrent GI bleeding Fortunately his myocardial perfusion study shows no evidence of ischemia and echocardiogram shows normal left ventricular function He has some mildly enlarged cervical lymph nodes left neck not firm not fixed and I asked him to make sure that he has this checked with his PCP next visit   Next appointment: 6 months   Medication Adjustments/Labs and Tests Ordered: Current medicines are reviewed at length with the patient today.  Concerns regarding medicines are outlined above.  Orders Placed This Encounter  Procedures   EKG 12-Lead   No orders of the defined types were placed in this encounter.    History of Present Illness:    Patrick Morris is a 72 y.o. male with a hx of atypical atrial flutter with chronic anticoagulation coronary artery calcium patient CT scan non-small cell lung cancer treated with XRT fand aortic atherosclerosis.  Last seen 01/16/2023.  He was initiated on flecainide as an outpatient 02/22/2023 an event monitor showed frequent episodes of SVT  Following that visit a cardiac CTA was attempted but he had rapid atrial flutter.  Myocardial perfusion study was performed showed EF of 53% normal perfusion normal function.  He had an outpatient troponin performed 02/19/2023 below the level of  detection.  He had an echocardiogram performed 02/06/2023 left ventricular ejection fraction low normal 50 to 55% mild LVH normal right ventricular size and function there is mild mitral regurgitation both the left and right atrium were normal in size.  Compliance with diet, lifestyle and medications: Yes  Placed him on flecainide and he does feel better although he is not particularly aware when he is in atrial flutter. He is not having chest pain edema shortness of breath palpitation or syncope His EKG shows no finding of 1C antiarrhythmic drug toxicity I strongly encouraged him to get a smart watch Fitbit to pair with his phone to screen for recurrences His prostate issues are resolved and he has had no hematuria with his anticoagulant Past Medical History:  Diagnosis Date   Atypical atrial flutter (HCC)    BPH (benign prostatic hyperplasia)    his specialist is recommending surgery.   Cataract    Cellulitis of foot 01/04/2021   Chronic anticoagulation    Colitis    COPD (chronic obstructive pulmonary disease) (HCC)    COPD exacerbation (HCC) 01/22/2014   Coronary artery calcification    Cough 01/22/2014   Ear infection    GERD (gastroesophageal reflux disease)    Hard of hearing    Laceration of left hand without foreign body 01/04/2021   Nodule of middle lobe of right lung 07/29/2020   Added automatically from request for surgery [id number]     Pain in left foot 09/20/2016   Pain in  left leg 09/20/2016   Pneumothorax 08/10/2020   Pneumothorax, right 08/10/2020   Salmonella    Sinus infection    Skin lesion 01/22/2014   Wellness examination 02/24/2014    Current Medications: Current Meds  Medication Sig   acetaminophen (TYLENOL) 500 MG tablet Take 1,000 mg by mouth every 6 (six) hours as needed for mild pain or moderate pain. For pain   alendronate (FOSAMAX) 35 MG tablet Take 1 tablet (35 mg total) by mouth every 7 (seven) days. Take with a full glass of water on an empty  stomach.   apixaban (ELIQUIS) 5 MG TABS tablet Take 1 tablet (5 mg total) by mouth 2 (two) times daily.   clonazePAM (KLONOPIN) 0.5 MG tablet Take 1 tablet by mouth twice daily as needed for anxiety   flecainide (TAMBOCOR) 50 MG tablet Take 1 tablet (50 mg total) by mouth 2 (two) times daily.   fluticasone (FLONASE) 50 MCG/ACT nasal spray Use 2 spray(s) in each nostril once daily   fluticasone-salmeterol (ADVAIR) 250-50 MCG/ACT AEPB INHALE 1 DOSE BY MOUTH IN THE MORNING AND 1 DOSE AT BEDTIME   gabapentin (NEURONTIN) 100 MG capsule TAKE 1 CAPSULE BY MOUTH THREE TIMES DAILY   metoprolol tartrate (LOPRESSOR) 25 MG tablet Take 1 tablet (25 mg total) by mouth 2 (two) times daily.   rosuvastatin (CRESTOR) 10 MG tablet Take 1 tablet (10 mg total) by mouth daily.      EKGs/Labs/Other Studies Reviewed:    The following studies were reviewed today:  Cardiac Studies & Procedures     STRESS TESTS  MYOCARDIAL PERFUSION IMAGING 02/15/2023  Interpretation Summary   The study is normal. The study is low risk.   No ST deviation was noted.   Left ventricular function is normal. Nuclear stress EF: 53%. The left ventricular ejection fraction is mildly decreased (45-54%). End diastolic cavity size is normal.   Prior study available for comparison from 03/22/2020.   ECHOCARDIOGRAM  ECHOCARDIOGRAM COMPLETE 02/06/2023  Narrative ECHOCARDIOGRAM REPORT    [name] Date of Exam: 02/06/2023 Medical Rec #:  [id number]     Height:       67.0 in Accession #:    [id number]    Weight:       201.2 lb Date of Birth:  Sep 23, 1950     BSA:          2.027 m Patient Age:    72 years      BP:           118/70 mmHg Patient Gender: M             HR:           72 bpm. Exam Location:  [place]  Procedure: 2D Echo, 3D Echo, Cardiac Doppler and Color Doppler  Indications:    Atypical atrial flutter (HCC) [I48.4 (ICD-10-CM)]; Dyspnea on exertion [R06.09 (ICD-10-CM)]  History:        Patient has  no prior history of Echocardiogram examinations. COPD and GERD, Arrythmias:Atrial Flutter, Signs/Symptoms:Dyspnea; Risk Factors:Dyslipidemia, Former Smoker and Hypertension.  Sonographer:    Jake Seats RDMS, RVT, RDCS Referring Phys: Rito Ehrlich [facility]  IMPRESSIONS   1. Rhythm strip during this exam demonstrates atrial flutter. 2. Left ventricular ejection fraction, by estimation, is 50 to 55% with beat to beat variability. The left ventricle has low normal function. The left ventricle has no regional wall motion abnormalities. There is mild concentric left ventricular hypertrophy. Left ventricular diastolic parameters  Cardiology Office Note:    Date:  02/28/2023   ID:  Patrick Morris, DOB 1951/02/18, MRN [id number]  PCP:  Esperanza Richters, PA-C  Cardiologist:  Norman Herrlich, MD    Referring MD: Esperanza Richters, PA-C    ASSESSMENT:    1. Atypical atrial flutter (HCC)   2. High risk medication use   3. Chronic anticoagulation    PLAN:    In order of problems listed above:  Continue low-dose flecainide no evidence of toxicity appears to be effective but I strongly encouraged him to get a smart watch pair with his phone self monitor and alert [place] if he is having a recurrence. In that situation I would apply a ZIO monitor for 1 week Continue his anticoagulant no recurrent GI bleeding Fortunately his myocardial perfusion study shows no evidence of ischemia and echocardiogram shows normal left ventricular function He has some mildly enlarged cervical lymph nodes left neck not firm not fixed and I asked him to make sure that he has this checked with his PCP next visit   Next appointment: 6 months   Medication Adjustments/Labs and Tests Ordered: Current medicines are reviewed at length with the patient today.  Concerns regarding medicines are outlined above.  Orders Placed This Encounter  Procedures   EKG 12-Lead   No orders of the defined types were placed in this encounter.    History of Present Illness:    Patrick Morris is a 72 y.o. male with a hx of atypical atrial flutter with chronic anticoagulation coronary artery calcium patient CT scan non-small cell lung cancer treated with XRT fand aortic atherosclerosis.  Last seen 01/16/2023.  He was initiated on flecainide as an outpatient 02/22/2023 an event monitor showed frequent episodes of SVT  Following that visit a cardiac CTA was attempted but he had rapid atrial flutter.  Myocardial perfusion study was performed showed EF of 53% normal perfusion normal function.  He had an outpatient troponin performed 02/19/2023 below the level of  detection.  He had an echocardiogram performed 02/06/2023 left ventricular ejection fraction low normal 50 to 55% mild LVH normal right ventricular size and function there is mild mitral regurgitation both the left and right atrium were normal in size.  Compliance with diet, lifestyle and medications: Yes  Placed him on flecainide and he does feel better although he is not particularly aware when he is in atrial flutter. He is not having chest pain edema shortness of breath palpitation or syncope His EKG shows no finding of 1C antiarrhythmic drug toxicity I strongly encouraged him to get a smart watch Fitbit to pair with his phone to screen for recurrences His prostate issues are resolved and he has had no hematuria with his anticoagulant Past Medical History:  Diagnosis Date   Atypical atrial flutter (HCC)    BPH (benign prostatic hyperplasia)    his specialist is recommending surgery.   Cataract    Cellulitis of foot 01/04/2021   Chronic anticoagulation    Colitis    COPD (chronic obstructive pulmonary disease) (HCC)    COPD exacerbation (HCC) 01/22/2014   Coronary artery calcification    Cough 01/22/2014   Ear infection    GERD (gastroesophageal reflux disease)    Hard of hearing    Laceration of left hand without foreign body 01/04/2021   Nodule of middle lobe of right lung 07/29/2020   Added automatically from request for surgery [id number]     Pain in left foot 09/20/2016   Pain in  Cardiology Office Note:    Date:  02/28/2023   ID:  Patrick Morris, DOB 1951/02/18, MRN [id number]  PCP:  Esperanza Richters, PA-C  Cardiologist:  Norman Herrlich, MD    Referring MD: Esperanza Richters, PA-C    ASSESSMENT:    1. Atypical atrial flutter (HCC)   2. High risk medication use   3. Chronic anticoagulation    PLAN:    In order of problems listed above:  Continue low-dose flecainide no evidence of toxicity appears to be effective but I strongly encouraged him to get a smart watch pair with his phone self monitor and alert [place] if he is having a recurrence. In that situation I would apply a ZIO monitor for 1 week Continue his anticoagulant no recurrent GI bleeding Fortunately his myocardial perfusion study shows no evidence of ischemia and echocardiogram shows normal left ventricular function He has some mildly enlarged cervical lymph nodes left neck not firm not fixed and I asked him to make sure that he has this checked with his PCP next visit   Next appointment: 6 months   Medication Adjustments/Labs and Tests Ordered: Current medicines are reviewed at length with the patient today.  Concerns regarding medicines are outlined above.  Orders Placed This Encounter  Procedures   EKG 12-Lead   No orders of the defined types were placed in this encounter.    History of Present Illness:    Patrick Morris is a 72 y.o. male with a hx of atypical atrial flutter with chronic anticoagulation coronary artery calcium patient CT scan non-small cell lung cancer treated with XRT fand aortic atherosclerosis.  Last seen 01/16/2023.  He was initiated on flecainide as an outpatient 02/22/2023 an event monitor showed frequent episodes of SVT  Following that visit a cardiac CTA was attempted but he had rapid atrial flutter.  Myocardial perfusion study was performed showed EF of 53% normal perfusion normal function.  He had an outpatient troponin performed 02/19/2023 below the level of  detection.  He had an echocardiogram performed 02/06/2023 left ventricular ejection fraction low normal 50 to 55% mild LVH normal right ventricular size and function there is mild mitral regurgitation both the left and right atrium were normal in size.  Compliance with diet, lifestyle and medications: Yes  Placed him on flecainide and he does feel better although he is not particularly aware when he is in atrial flutter. He is not having chest pain edema shortness of breath palpitation or syncope His EKG shows no finding of 1C antiarrhythmic drug toxicity I strongly encouraged him to get a smart watch Fitbit to pair with his phone to screen for recurrences His prostate issues are resolved and he has had no hematuria with his anticoagulant Past Medical History:  Diagnosis Date   Atypical atrial flutter (HCC)    BPH (benign prostatic hyperplasia)    his specialist is recommending surgery.   Cataract    Cellulitis of foot 01/04/2021   Chronic anticoagulation    Colitis    COPD (chronic obstructive pulmonary disease) (HCC)    COPD exacerbation (HCC) 01/22/2014   Coronary artery calcification    Cough 01/22/2014   Ear infection    GERD (gastroesophageal reflux disease)    Hard of hearing    Laceration of left hand without foreign body 01/04/2021   Nodule of middle lobe of right lung 07/29/2020   Added automatically from request for surgery [id number]     Pain in left foot 09/20/2016   Pain in  left leg 09/20/2016   Pneumothorax 08/10/2020   Pneumothorax, right 08/10/2020   Salmonella    Sinus infection    Skin lesion 01/22/2014   Wellness examination 02/24/2014    Current Medications: Current Meds  Medication Sig   acetaminophen (TYLENOL) 500 MG tablet Take 1,000 mg by mouth every 6 (six) hours as needed for mild pain or moderate pain. For pain   alendronate (FOSAMAX) 35 MG tablet Take 1 tablet (35 mg total) by mouth every 7 (seven) days. Take with a full glass of water on an empty  stomach.   apixaban (ELIQUIS) 5 MG TABS tablet Take 1 tablet (5 mg total) by mouth 2 (two) times daily.   clonazePAM (KLONOPIN) 0.5 MG tablet Take 1 tablet by mouth twice daily as needed for anxiety   flecainide (TAMBOCOR) 50 MG tablet Take 1 tablet (50 mg total) by mouth 2 (two) times daily.   fluticasone (FLONASE) 50 MCG/ACT nasal spray Use 2 spray(s) in each nostril once daily   fluticasone-salmeterol (ADVAIR) 250-50 MCG/ACT AEPB INHALE 1 DOSE BY MOUTH IN THE MORNING AND 1 DOSE AT BEDTIME   gabapentin (NEURONTIN) 100 MG capsule TAKE 1 CAPSULE BY MOUTH THREE TIMES DAILY   metoprolol tartrate (LOPRESSOR) 25 MG tablet Take 1 tablet (25 mg total) by mouth 2 (two) times daily.   rosuvastatin (CRESTOR) 10 MG tablet Take 1 tablet (10 mg total) by mouth daily.      EKGs/Labs/Other Studies Reviewed:    The following studies were reviewed today:  Cardiac Studies & Procedures     STRESS TESTS  MYOCARDIAL PERFUSION IMAGING 02/15/2023  Interpretation Summary   The study is normal. The study is low risk.   No ST deviation was noted.   Left ventricular function is normal. Nuclear stress EF: 53%. The left ventricular ejection fraction is mildly decreased (45-54%). End diastolic cavity size is normal.   Prior study available for comparison from 03/22/2020.   ECHOCARDIOGRAM  ECHOCARDIOGRAM COMPLETE 02/06/2023  Narrative ECHOCARDIOGRAM REPORT    [name] Date of Exam: 02/06/2023 Medical Rec #:  [id number]     Height:       67.0 in Accession #:    [id number]    Weight:       201.2 lb Date of Birth:  Sep 23, 1950     BSA:          2.027 m Patient Age:    72 years      BP:           118/70 mmHg Patient Gender: M             HR:           72 bpm. Exam Location:  [place]  Procedure: 2D Echo, 3D Echo, Cardiac Doppler and Color Doppler  Indications:    Atypical atrial flutter (HCC) [I48.4 (ICD-10-CM)]; Dyspnea on exertion [R06.09 (ICD-10-CM)]  History:        Patient has  no prior history of Echocardiogram examinations. COPD and GERD, Arrythmias:Atrial Flutter, Signs/Symptoms:Dyspnea; Risk Factors:Dyslipidemia, Former Smoker and Hypertension.  Sonographer:    Jake Seats RDMS, RVT, RDCS Referring Phys: Rito Ehrlich [facility]  IMPRESSIONS   1. Rhythm strip during this exam demonstrates atrial flutter. 2. Left ventricular ejection fraction, by estimation, is 50 to 55% with beat to beat variability. The left ventricle has low normal function. The left ventricle has no regional wall motion abnormalities. There is mild concentric left ventricular hypertrophy. Left ventricular diastolic parameters

## 2023-02-28 ENCOUNTER — Ambulatory Visit: Payer: 59 | Attending: Cardiology | Admitting: Cardiology

## 2023-02-28 ENCOUNTER — Encounter: Payer: Self-pay | Admitting: Cardiology

## 2023-02-28 VITALS — BP 124/82 | HR 90 | Resp 16 | Ht 67.0 in | Wt 196.6 lb

## 2023-02-28 DIAGNOSIS — I484 Atypical atrial flutter: Secondary | ICD-10-CM

## 2023-02-28 DIAGNOSIS — Z7901 Long term (current) use of anticoagulants: Secondary | ICD-10-CM

## 2023-02-28 DIAGNOSIS — Z79899 Other long term (current) drug therapy: Secondary | ICD-10-CM | POA: Diagnosis not present

## 2023-02-28 NOTE — Patient Instructions (Addendum)
Medication Instructions:  Your physician recommends that you continue on your current medications as directed. Please refer to the Current Medication list given to you today.  *If you need a refill on your cardiac medications before your next appointment, please call your pharmacy*   Lab Work: None If you have labs (blood work) drawn today and your tests are completely normal, you will receive your results only by: MyChart Message (if you have MyChart) OR A paper copy in the mail If you have any lab test that is abnormal or we need to change your treatment, we will call you to review the results.   Testing/Procedures: None   Follow-Up: At [facility], you and your health needs are our priority.  As part of our continuing mission to provide you with exceptional heart care, we have created designated Provider Care Teams.  These Care Teams include your primary Cardiologist (physician) and Advanced Practice Providers (APPs -  Physician Assistants and Nurse Practitioners) who all work together to provide you with the care you need, when you need it.  We recommend signing up for the patient portal called "MyChart".  Sign up information is provided on this After Visit Summary.  MyChart is used to connect with patients for Virtual Visits (Telemedicine).  Patients are able to view lab/test results, encounter notes, upcoming appointments, etc.  Non-urgent messages can be sent to your provider as well.   To learn more about what you can do with MyChart, go to [email or web address].    Your next appointment:   6 month(s)  Provider:   Norman Herrlich, MD    Other Instructions Get FitBit to monitor. Send Dr. Dulce Sellar a message if A-fib occurs.

## 2023-03-01 ENCOUNTER — Other Ambulatory Visit: Payer: Self-pay | Admitting: Cardiology

## 2023-03-01 ENCOUNTER — Other Ambulatory Visit: Payer: Self-pay | Admitting: Medical

## 2023-03-01 NOTE — Telephone Encounter (Signed)
Requesting: klonopin Contract:08/29/22 UDS:08/29/22 Last Visit:08/15/22 Next Visit:n/a Last Refill:12/18/22  Please Advise

## 2023-03-03 NOTE — Telephone Encounter (Signed)
Sent in 10 day rx. Pt needs to schedule controlled med visit. 6 months since last visit.

## 2023-03-19 ENCOUNTER — Encounter: Payer: Self-pay | Admitting: Medical

## 2023-03-19 ENCOUNTER — Ambulatory Visit (INDEPENDENT_AMBULATORY_CARE_PROVIDER_SITE_OTHER): Payer: 59 | Admitting: Medical

## 2023-03-19 VITALS — BP 122/70 | HR 62 | Temp 98.1°F | Resp 18 | Ht 67.0 in | Wt 197.0 lb

## 2023-03-19 DIAGNOSIS — M8580 Other specified disorders of bone density and structure, unspecified site: Secondary | ICD-10-CM | POA: Diagnosis not present

## 2023-03-19 DIAGNOSIS — Z79899 Other long term (current) drug therapy: Secondary | ICD-10-CM

## 2023-03-19 DIAGNOSIS — F419 Anxiety disorder, unspecified: Secondary | ICD-10-CM | POA: Diagnosis not present

## 2023-03-19 DIAGNOSIS — H43391 Other vitreous opacities, right eye: Secondary | ICD-10-CM | POA: Diagnosis not present

## 2023-03-19 MED ORDER — CLONAZEPAM 0.5 MG PO TABS: 0.5000 mg | ORAL_TABLET | Freq: Two times a day (BID) | ORAL | 0 refills | Status: DC | PRN

## 2023-03-19 MED ORDER — ALENDRONATE SODIUM 35 MG PO TABS: 35.0000 mg | ORAL_TABLET | ORAL | 11 refills | Status: AC

## 2023-03-19 NOTE — Patient Instructions (Signed)
New onset floaters in the right eye No associated visual acuity changes, light sensitivity, or flashing lights. Vision in the right eye is 20/70. Patient has a history of cataract surgery but does not recall which eye. No recent ophthalmology follow-up. -Refer to ophthalmology for further evaluation. -Advise patient can also try to seek optometry evaluation if unable to secure an ophthalmology appointment.  Anxiety Patient is on Xanax and has a history of a controlled substance contract. -Update controlled substance contract and perform urine drug screen. -Prescribe Xanax refill.  Osteopenia Noted on previous imaging. -Refill Fosamax to improve bone density.  Follow-up in 10 days or sooner if needed.

## 2023-03-19 NOTE — Progress Notes (Addendum)
Subjective:    Patient ID: Patrick Morris, male    DOB: 17-Sep-1950, 72 y.o.   MRN: [id number]  HPI  The patient presents with a new onset of a floater in his vision, which started approximately two weeks ago. He describes the floater as resembling a spider, which appears to increase in size with head movement. Despite the presence of the floater, the patient denies any changes in his vision, including blurred vision, light sensitivity, or flashing light sensations. He reports that his visual field appears normal, with no blockages or shading.  The patient has a history of cataract surgery, although he is uncertain which eye was operated on. The surgery was performed approximately 10-15 years ago, and he has not seen an ophthalmologist since. He does not currently wear glasses or contact lenses.  In addition to the visual symptoms, the patient also reports experiencing anxiety, for which he has been prescribed Xanax in the past. He also has a history of heart problems, including an irregular heart rhythm, for which he is taking medication. He has been diagnosed with diffuse osteopenia, visible on x-ray, and has been prescribed Fosamax for this condition.  RADIOLOGY X-ray: Diffuse osteopenia (11/2021)  DIAGNOSTIC Visual acuity: 20/70 in right eye (03/16/2023)  Review of Systems  Eyes:  Negative for photophobia, pain, discharge, redness, itching and visual disturbance.       Only large floater  Respiratory:  Negative for cough and choking.   Cardiovascular:  Negative for chest pain and palpitations.  Gastrointestinal:  Negative for abdominal pain.  Neurological:  Negative for dizziness, syncope, weakness and headaches.  Hematological:  Negative for adenopathy. Does not bruise/bleed easily.  Psychiatric/Behavioral:  The patient is nervous/anxious.    Past Medical History:  Diagnosis Date   Atypical atrial flutter (HCC)    BPH (benign prostatic hyperplasia)    his specialist is  recommending surgery.   Cataract    Cellulitis of foot 01/04/2021   Chronic anticoagulation    Colitis    COPD (chronic obstructive pulmonary disease) (HCC)    COPD exacerbation (HCC) 01/22/2014   Coronary artery calcification    Cough 01/22/2014   Ear infection    GERD (gastroesophageal reflux disease)    Hard of hearing    Laceration of left hand without foreign body 01/04/2021   Nodule of middle lobe of right lung 07/29/2020   Added automatically from request for surgery [id number]     Pain in left foot 09/20/2016   Pain in left leg 09/20/2016   Pneumothorax 08/10/2020   Pneumothorax, right 08/10/2020   Salmonella    Sinus infection    Skin lesion 01/22/2014   Wellness examination 02/24/2014     Social History   Socioeconomic History   Marital status: Married    Spouse name: Not on file   Number of children: Not on file   Years of education: Not on file   Highest education level: Not on file  Occupational History   Not on file  Tobacco Use   Smoking status: Former    Current packs/day: 1.00    Average packs/day: 1 pack/day for 55.0 years (55.0 ttl pk-yrs)    Types: Cigarettes   Smokeless tobacco: Never   Tobacco comments:    down to 0.5ppd  Vaping Use   Vaping status: Never Used  Substance and Sexual Activity   Alcohol use: Never   Drug use: No   Sexual activity: Not Currently    Birth control/protection: None  Other Topics  Concern   Not on file  Social History Narrative   Not on file   Social Determinants of Health   Financial Resource Strain: Low Risk  (04/11/2021)   Overall Financial Resource Strain (CARDIA)    Difficulty of Paying Living Expenses: Not hard at all  Food Insecurity: No Food Insecurity (11/22/2021)   Received from [facility], [facility]   Hunger Vital Sign    Worried About Running Out of Food in the Last Year: Never true    Ran Out of Food in the Last Year: Never true  Transportation Needs: No Transportation Needs (04/11/2021)    PRAPARE - Administrator, Civil Service (Medical): No    Lack of Transportation (Non-Medical): No  Physical Activity: Inactive (04/11/2021)   Exercise Vital Sign    Days of Exercise per Week: 0 days    Minutes of Exercise per Session: 0 min  Stress: No Stress Concern Present (02/20/2022)   Received from [facility], [facility] of Occupational Health - Occupational Stress Questionnaire    Feeling of Stress : Not at all  Social Connections: Unknown (09/02/2021)   Received from [facility], [facility]   Social Network    Social Network: Not on file  Intimate Partner Violence: Not At Risk (08/30/2022)   Received from [facility]   HITS    Over the last 12 months how often did your partner physically hurt you?: Never    Over the last 12 months how often did your partner insult you or talk down to you?: Never    Over the last 12 months how often did your partner threaten you with physical harm?: Never    Over the last 12 months how often did your partner scream or curse at you?: Never    Past Surgical History:  Procedure Laterality Date   BRONCHIAL BIOPSY  08/10/2020   Procedure: BRONCHIAL BIOPSIES;  Surgeon: Josephine Igo, DO;  Location: [facility];  Service: Pulmonary;;   BRONCHIAL BRUSHINGS  08/10/2020   Procedure: BRONCHIAL BRUSHINGS;  Surgeon: Josephine Igo, DO;  Location: [facility];  Service: Pulmonary;;   BRONCHIAL NEEDLE ASPIRATION BIOPSY  08/10/2020   Procedure: BRONCHIAL NEEDLE ASPIRATION BIOPSIES;  Surgeon: Josephine Igo, DO;  Location: [facility];  Service: Pulmonary;;   BRONCHIAL WASHINGS  08/10/2020   Procedure: BRONCHIAL WASHINGS;  Surgeon: Josephine Igo, DO;  Location: [facility];  Service: Pulmonary;;   COLONOSCOPY     ENDOBRONCHIAL ULTRASOUND  08/10/2020   Procedure: ENDOBRONCHIAL ULTRASOUND;  Surgeon: Josephine Igo, DO;  Location: [facility];  Service: Pulmonary;;   EYE SURGERY Right     FIDUCIAL MARKER PLACEMENT  08/10/2020   Procedure: FIDUCIAL MARKER PLACEMENT;  Surgeon: Josephine Igo, DO;  Location: [facility];  Service: Pulmonary;;   IR RADIOLOGIST EVAL & MGMT  05/07/2019   IR RADIOLOGIST EVAL & MGMT  05/27/2019   IR RADIOLOGIST EVAL & MGMT  10/23/2019   IR RADIOLOGIST EVAL & MGMT  01/15/2020   VIDEO BRONCHOSCOPY WITH ENDOBRONCHIAL NAVIGATION Right 08/10/2020   Procedure: VIDEO BRONCHOSCOPY WITH ENDOBRONCHIAL NAVIGATION;  Surgeon: Josephine Igo, DO;  Location: [facility];  Service: Pulmonary;  Laterality: Right;    Family History  Problem Relation Age of Onset   Hyperlipidemia Father    Stroke Father    Alcohol abuse Father    Throat cancer Brother    Lymphoma Brother    Heart attack Brother  Allergies  Allergen Reactions   Aspirin Other (See Comments)    Acid reflux and drooling    Current Outpatient Medications on File Prior to Visit  Medication Sig Dispense Refill   acetaminophen (TYLENOL) 500 MG tablet Take 1,000 mg by mouth every 6 (six) hours as needed for mild pain or moderate pain. For pain     apixaban (ELIQUIS) 5 MG TABS tablet Take 1 tablet (5 mg total) by mouth 2 (two) times daily. 180 tablet 3   flecainide (TAMBOCOR) 50 MG tablet Take 1 tablet (50 mg total) by mouth 2 (two) times daily. 180 tablet 3   fluticasone (FLONASE) 50 MCG/ACT nasal spray Use 2 spray(s) in each nostril once daily 16 g 0   fluticasone-salmeterol (ADVAIR) 250-50 MCG/ACT AEPB INHALE 1 DOSE BY MOUTH IN THE MORNING AND 1 DOSE AT BEDTIME 180 each 0   gabapentin (NEURONTIN) 100 MG capsule TAKE 1 CAPSULE BY MOUTH THREE TIMES DAILY 90 capsule 0   metoprolol tartrate (LOPRESSOR) 25 MG tablet Take 1 tablet (25 mg total) by mouth 2 (two) times daily. 180 tablet 3   rosuvastatin (CRESTOR) 10 MG tablet Take 1 tablet by mouth once daily 90 tablet 0   No current facility-administered medications on file prior to visit.    BP 122/70 (BP Location: Right Arm, Patient Position:  Sitting, Cuff Size: Large)   Pulse 62   Temp 98.1 F (36.7 C) (Oral)   Resp 18   Ht 5\' 7"  (1.702 m)   Wt 197 lb (89.4 kg)   SpO2 97%   BMI 30.85 kg/m        Objective:   Physical Exam General- No acute distress. Pleasant patient. Neck- Full range of motion, no jvd Lungs- Clear, even and unlabored. Heart- regular rate and rhythm. Neurologic- CNII- XII grossly intact.   Eyes-peerl. Rt eye conjunctiva clear. Rt eye VA 70. Left eye 20/40     Assessment & Plan:   Patient Instructions  New onset floaters in the right eye No associated visual acuity changes, light sensitivity, or flashing lights. Vision in the right eye is 20/70. Patient has a history of cataract surgery but does not recall which eye. No recent ophthalmology follow-up. -Refer to ophthalmology for further evaluation. -Advise patient can also try to seek optometry evaluation if unable to secure an ophthalmology appointment.  Anxiety Patient is on Xanax and has a history of a controlled substance contract. -Update controlled substance contract and perform urine drug screen. -Prescribe Xanax refill.  Osteopenia Noted on previous imaging. -Refill Fosamax to improve bone density.  Follow-up in 10 days or sooner if needed.

## 2023-03-19 NOTE — Addendum Note (Signed)
Addended by: Gwenevere Abbot on: 03/19/2023 04:01 PM   Modules accepted: Orders

## 2023-03-19 NOTE — Addendum Note (Signed)
Addended by: Roxanne Gates on: 03/19/2023 03:54 PM   Modules accepted: Orders

## 2023-03-20 DIAGNOSIS — D414 Neoplasm of uncertain behavior of bladder: Secondary | ICD-10-CM | POA: Insufficient documentation

## 2023-03-20 DIAGNOSIS — N323 Diverticulum of bladder: Secondary | ICD-10-CM | POA: Diagnosis not present

## 2023-03-20 DIAGNOSIS — N138 Other obstructive and reflux uropathy: Secondary | ICD-10-CM | POA: Diagnosis not present

## 2023-03-20 DIAGNOSIS — C674 Malignant neoplasm of posterior wall of bladder: Secondary | ICD-10-CM | POA: Diagnosis not present

## 2023-03-20 DIAGNOSIS — C641 Malignant neoplasm of right kidney, except renal pelvis: Secondary | ICD-10-CM | POA: Diagnosis not present

## 2023-03-20 HISTORY — DX: Neoplasm of uncertain behavior of bladder: D41.4

## 2023-03-21 LAB — DRUG MONITORING PANEL 376104, URINE
Alphahydroxyalprazolam: NEGATIVE ng/mL (ref <25)
Alphahydroxymidazolam: NEGATIVE ng/mL (ref <50)
Alphahydroxytriazolam: NEGATIVE ng/mL (ref <50)
Aminoclonazepam: 186 ng/mL — ABNORMAL HIGH (ref <25)
Amphetamines: NEGATIVE ng/mL (ref <500)
Barbiturates: NEGATIVE ng/mL (ref <300)
Benzodiazepines: POSITIVE ng/mL — AB (ref <100)
Cocaine Metabolite: NEGATIVE ng/mL (ref <150)
Desmethyltramadol: NEGATIVE ng/mL (ref <100)
Hydroxyethylflurazepam: NEGATIVE ng/mL (ref <50)
Lorazepam: NEGATIVE ng/mL (ref <50)
Nordiazepam: NEGATIVE ng/mL (ref <50)
Opiates: NEGATIVE ng/mL (ref <100)
Oxazepam: NEGATIVE ng/mL (ref <50)
Oxycodone: NEGATIVE ng/mL (ref <100)
Temazepam: NEGATIVE ng/mL (ref <50)
Tramadol: NEGATIVE ng/mL (ref <100)

## 2023-03-21 LAB — DM TEMPLATE

## 2023-03-23 DIAGNOSIS — J432 Centrilobular emphysema: Secondary | ICD-10-CM | POA: Diagnosis not present

## 2023-03-23 DIAGNOSIS — Z87891 Personal history of nicotine dependence: Secondary | ICD-10-CM | POA: Diagnosis not present

## 2023-03-23 DIAGNOSIS — C342 Malignant neoplasm of middle lobe, bronchus or lung: Secondary | ICD-10-CM | POA: Diagnosis not present

## 2023-03-27 DIAGNOSIS — K8020 Calculus of gallbladder without cholecystitis without obstruction: Secondary | ICD-10-CM | POA: Diagnosis not present

## 2023-03-27 DIAGNOSIS — N281 Cyst of kidney, acquired: Secondary | ICD-10-CM | POA: Diagnosis not present

## 2023-03-27 DIAGNOSIS — C641 Malignant neoplasm of right kidney, except renal pelvis: Secondary | ICD-10-CM | POA: Diagnosis not present

## 2023-03-28 ENCOUNTER — Telehealth: Payer: Self-pay

## 2023-03-28 NOTE — Telephone Encounter (Signed)
   Pre-operative Risk Assessment    Patient Name: Patrick Morris  DOB: 28-Jan-1951 MRN: [id number]      Request for Surgical Clearance    Procedure:   cystoscopy, transurethral resection bladder tomor, fulguration of bladder diverticular, instillation of bladder chemo  Date of Surgery:  Clearance 04/18/23                                 Surgeon:  Dr. Charlsie Merles Surgeon's Group or Practice Name:  [facility] Phone number:  [phone] Fax number:  [phone]   Type of Clearance Requested:   - Pharmacy:  Hold Apixaban (Eliquis) please advise   Type of Anesthesia:  Not Indicated   Additional requests/questions:    Merlene Laughter   03/28/2023, 4:31 PM

## 2023-03-30 ENCOUNTER — Telehealth: Payer: Self-pay | Admitting: Medical

## 2023-03-31 NOTE — Telephone Encounter (Signed)
Requesting: clonazepam 0.5mg  Contract: Yes UDS: 03/19/23 Last Visit: 03/19/2023 Next Visit: Visit date not found Last Refill: 03/19/23  Please Advise

## 2023-03-31 NOTE — Telephone Encounter (Signed)
Double check to see if rx went thru. May have had imprivata app malfunction.  But did not want to sent med in twice. If clonazepam did not go thru let me know.

## 2023-04-01 NOTE — Telephone Encounter (Signed)
Patient with diagnosis of A flutter on Eliquis for anticoagulation.    Procedure: cystoscopy, transurethral resection bladder tomor, fulguration of bladder diverticular, instillation of bladder chemo  Date of procedure: 04/18/23   CHA2DS2-VASc Score = 2  This indicates a 2.2% annual risk of stroke. The patient's score is based upon: CHF History: 0 HTN History: 0 Diabetes History: 0 Stroke History: 0 Vascular Disease History: 1 Age Score: 1 Gender Score: 0    CrCl 182ml/min Platelet count 240K  Per office protocol, patient can hold Eliquis for 3 days prior to procedure.    **This guidance is not considered finalized until pre-operative APP has relayed final recommendations.**

## 2023-04-02 NOTE — Telephone Encounter (Signed)
Pt's wife states pharmacy has not received rx.

## 2023-04-02 NOTE — Telephone Encounter (Signed)
Spoke with pharmacy stated script has been ready for 2 days , called wife to let her know it was ready

## 2023-04-04 DIAGNOSIS — Z7901 Long term (current) use of anticoagulants: Secondary | ICD-10-CM | POA: Diagnosis not present

## 2023-04-04 DIAGNOSIS — Z01810 Encounter for preprocedural cardiovascular examination: Secondary | ICD-10-CM | POA: Diagnosis not present

## 2023-04-04 DIAGNOSIS — Z87891 Personal history of nicotine dependence: Secondary | ICD-10-CM | POA: Diagnosis not present

## 2023-04-04 DIAGNOSIS — I471 Supraventricular tachycardia, unspecified: Secondary | ICD-10-CM | POA: Diagnosis not present

## 2023-04-04 DIAGNOSIS — C679 Malignant neoplasm of bladder, unspecified: Secondary | ICD-10-CM | POA: Diagnosis not present

## 2023-04-04 DIAGNOSIS — Z01812 Encounter for preprocedural laboratory examination: Secondary | ICD-10-CM | POA: Diagnosis not present

## 2023-04-04 DIAGNOSIS — R001 Bradycardia, unspecified: Secondary | ICD-10-CM | POA: Diagnosis not present

## 2023-04-04 DIAGNOSIS — N2889 Other specified disorders of kidney and ureter: Secondary | ICD-10-CM | POA: Diagnosis not present

## 2023-04-04 DIAGNOSIS — Z79899 Other long term (current) drug therapy: Secondary | ICD-10-CM | POA: Diagnosis not present

## 2023-04-04 DIAGNOSIS — C3490 Malignant neoplasm of unspecified part of unspecified bronchus or lung: Secondary | ICD-10-CM | POA: Diagnosis not present

## 2023-04-04 DIAGNOSIS — J449 Chronic obstructive pulmonary disease, unspecified: Secondary | ICD-10-CM | POA: Diagnosis not present

## 2023-04-04 DIAGNOSIS — Z01818 Encounter for other preprocedural examination: Secondary | ICD-10-CM | POA: Diagnosis not present

## 2023-04-04 DIAGNOSIS — I2510 Atherosclerotic heart disease of native coronary artery without angina pectoris: Secondary | ICD-10-CM | POA: Diagnosis not present

## 2023-04-04 DIAGNOSIS — I4892 Unspecified atrial flutter: Secondary | ICD-10-CM | POA: Diagnosis not present

## 2023-04-04 DIAGNOSIS — Z9889 Other specified postprocedural states: Secondary | ICD-10-CM | POA: Diagnosis not present

## 2023-04-04 DIAGNOSIS — K219 Gastro-esophageal reflux disease without esophagitis: Secondary | ICD-10-CM | POA: Diagnosis not present

## 2023-04-04 DIAGNOSIS — Z7983 Long term (current) use of bisphosphonates: Secondary | ICD-10-CM | POA: Diagnosis not present

## 2023-04-04 DIAGNOSIS — I440 Atrioventricular block, first degree: Secondary | ICD-10-CM | POA: Diagnosis not present

## 2023-04-04 NOTE — Telephone Encounter (Signed)
   Patient Name: Patrick Morris  DOB: 1950/08/13 MRN: [id number]  Primary Cardiologist: None  Chart reviewed as part of pre-operative protocol coverage. Patient has an upcoming Urologic procedure scheduled for 04/18/2023 and we were asked to give our recommendations for holding Eliquis. Per Pharmacy and office protocol, patient can hold Eliquis for 3 days prior procedure. Please restart as soon as safely possible afterwards.   I will route this recommendation to the requesting party via Epic fax function and remove from pre-op pool.  Please call with questions.  Corrin Parker, PA-C 04/04/2023, 10:44 AM

## 2023-04-10 DIAGNOSIS — C3490 Malignant neoplasm of unspecified part of unspecified bronchus or lung: Secondary | ICD-10-CM | POA: Diagnosis not present

## 2023-04-10 DIAGNOSIS — C342 Malignant neoplasm of middle lobe, bronchus or lung: Secondary | ICD-10-CM | POA: Diagnosis not present

## 2023-04-10 DIAGNOSIS — I700 Atherosclerosis of aorta: Secondary | ICD-10-CM | POA: Diagnosis not present

## 2023-04-10 DIAGNOSIS — J439 Emphysema, unspecified: Secondary | ICD-10-CM | POA: Diagnosis not present

## 2023-04-11 ENCOUNTER — Other Ambulatory Visit: Payer: Self-pay | Admitting: Medical

## 2023-04-12 ENCOUNTER — Ambulatory Visit: Payer: 59 | Admitting: Family Medicine

## 2023-04-12 VITALS — BP 130/70 | HR 68 | Temp 98.0°F | Resp 18 | Ht 67.0 in | Wt 197.8 lb

## 2023-04-12 DIAGNOSIS — R6883 Chills (without fever): Secondary | ICD-10-CM

## 2023-04-12 DIAGNOSIS — F419 Anxiety disorder, unspecified: Secondary | ICD-10-CM

## 2023-04-12 LAB — POC COVID19 BINAXNOW: SARS Coronavirus 2 Ag: NEGATIVE

## 2023-04-12 LAB — POCT INFLUENZA A/B
Influenza A, POC: NEGATIVE
Influenza B, POC: NEGATIVE

## 2023-04-12 MED ORDER — CLONAZEPAM 0.5 MG PO TABS: 0.5000 mg | ORAL_TABLET | Freq: Two times a day (BID) | ORAL | 0 refills | Status: DC | PRN

## 2023-04-12 NOTE — Progress Notes (Addendum)
[facility] [address] [place], [place] [address] [phone]  Date:  04/12/2023   Name:  Patrick Morris   DOB:  17-Nov-1950   MRN:  [id number]  PCP:  Esperanza Richters, PA-C    Chief Complaint: URI (ear clogged/ face aching/ body aches- started 3-2 days ago. /R jaw pain x "a while" )   History of Present Illness:  Patrick Morris is a 72 y.o. very pleasant male patient who presents with the following:  Patient seen today with concern of acute illness.  Primary patient of my partner Esperanza Richters, PA-C.  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  He wrote notes his right jaw hurts when he chews.  He has complete dentures send no tooth pain He notes he does not feel as hungry as usual He felt feverish but did not check his temp, some chills He does cough but this is not new -he has known history of COPD He did quit smoking a couple of years ago  He is having surgery next week to take out a bladder cancer tumor and admits he is feeling quite nervous He would like a refill of his clonazepam if possible  Eliquis Fosamax Clonazepam as needed Flecainide Advair Gabapentin 100 3 times daily Metoprolol 25 twice daily Crestor  They notes he had a CT scan "of my lungs" on Tuesday but we are waiting on these results -done at [facility]  Patient Active Problem List   Diagnosis Date Noted   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   Mixed dyslipidemia 01/11/2023   Dyspnea on exertion 01/11/2023   Laceration of left hand without foreign body  01/04/2021   Cellulitis of foot 01/04/2021   Hard of hearing    Coronary artery calcification    Chronic anticoagulation    Atypical atrial flutter (HCC)    Pneumothorax, right 08/10/2020   Pneumothorax 08/10/2020   Nodule of middle lobe of right lung 07/29/2020   Sinus infection    Salmonella    Ear infection    COPD (chronic obstructive pulmonary disease) (HCC)    Colitis    Cataract    Pain in left foot 09/20/2016   Pain in left leg 09/20/2016   Wellness examination 02/24/2014   GERD (gastroesophageal reflux disease) 01/22/2014   COPD exacerbation (HCC) 01/22/2014   Skin lesion 01/22/2014   BPH (benign prostatic hyperplasia) 01/22/2014   Cough 01/22/2014    Past Medical History:  Diagnosis Date   Atypical atrial flutter (HCC)    BPH (benign prostatic hyperplasia)    his specialist is recommending surgery.   Cataract    Cellulitis of foot 01/04/2021   Chronic anticoagulation    Colitis    COPD (chronic obstructive pulmonary disease) (HCC)    COPD exacerbation (HCC) 01/22/2014   Coronary artery calcification    Cough 01/22/2014   Ear infection    GERD (gastroesophageal reflux disease)    Hard of hearing    Laceration of left hand without foreign body 01/04/2021   Nodule of middle lobe  of right lung 07/29/2020   Added automatically from request for surgery [id number]     Pain in left foot 09/20/2016   Pain in left leg 09/20/2016   Pneumothorax 08/10/2020   Pneumothorax, right 08/10/2020   Salmonella    Sinus infection    Skin lesion 01/22/2014   Wellness examination 02/24/2014    Past Surgical History:  Procedure Laterality Date   BRONCHIAL BIOPSY  08/10/2020   Procedure: BRONCHIAL BIOPSIES;  Surgeon: Josephine Igo, DO;  Location: [facility];  Service: Pulmonary;;   BRONCHIAL BRUSHINGS  08/10/2020   Procedure: BRONCHIAL BRUSHINGS;  Surgeon: Josephine Igo, DO;  Location: [facility];  Service: Pulmonary;;   BRONCHIAL NEEDLE ASPIRATION BIOPSY  08/10/2020    Procedure: BRONCHIAL NEEDLE ASPIRATION BIOPSIES;  Surgeon: Josephine Igo, DO;  Location: [facility];  Service: Pulmonary;;   BRONCHIAL WASHINGS  08/10/2020   Procedure: BRONCHIAL WASHINGS;  Surgeon: Josephine Igo, DO;  [facility]: [facility];  Service: Pulmonary;;   COLONOSCOPY     ENDOBRONCHIAL ULTRASOUND  08/10/2020   Procedure: ENDOBRONCHIAL ULTRASOUND;  Surgeon: Josephine Igo, DO;  [facility]: [facility];  Service: Pulmonary;;   EYE SURGERY Right    FIDUCIAL MARKER PLACEMENT  08/10/2020   Procedure: FIDUCIAL MARKER PLACEMENT;  Surgeon: Josephine Igo, DO;  [facility]: [facility];  Service: Pulmonary;;   IR RADIOLOGIST EVAL & MGMT  05/07/2019   IR RADIOLOGIST EVAL & MGMT  05/27/2019   IR RADIOLOGIST EVAL & MGMT  10/23/2019   IR RADIOLOGIST EVAL & MGMT  01/15/2020   VIDEO BRONCHOSCOPY WITH ENDOBRONCHIAL NAVIGATION Right 08/10/2020   Procedure: VIDEO BRONCHOSCOPY WITH ENDOBRONCHIAL NAVIGATION;  Surgeon: Josephine Igo, DO;  Location: [facility];  Service: Pulmonary;  Laterality: Right;    Social History   Tobacco Use   Smoking status: Former    Current packs/day: 1.00    Average packs/day: 1 pack/day for 55.0 years (55.0 ttl pk-yrs)    Types: Cigarettes   Smokeless tobacco: Never   Tobacco comments:    down to 0.5ppd  Vaping Use   Vaping status: Never Used  Substance Use Topics   Alcohol use: Never   Drug use: No    Family History  Problem Relation Age of Onset   Hyperlipidemia Father    Stroke Father    Alcohol abuse Father    Throat cancer Brother    Lymphoma Brother    Heart attack Brother     Allergies  Allergen Reactions   Aspirin Other (See Comments)    Acid reflux and drooling    Medication list has been reviewed and updated.  Current Outpatient Medications on File Prior to Visit  Medication Sig Dispense Refill   acetaminophen (TYLENOL) 500 MG tablet Take 1,000 mg by mouth every 6 (six) hours as needed for mild pain or moderate pain. For pain      albuterol (VENTOLIN HFA) 108 (90 Base) MCG/ACT inhaler Inhale into the lungs.     alendronate (FOSAMAX) 35 MG tablet Take 1 tablet (35 mg total) by mouth every 7 (seven) days. Take with a full glass of water on an empty stomach. 4 tablet 11   clonazePAM (KLONOPIN) 0.5 MG tablet Take 1 tablet by mouth twice daily as needed for anxiety 20 tablet 0   flecainide (TAMBOCOR) 50 MG tablet Take 1 tablet (50 mg total) by mouth 2 (two) times daily. 180 tablet 3   fluticasone (FLONASE) 50 MCG/ACT nasal spray Use 2 spray(s) in each nostril once daily 16  g 0   gabapentin (NEURONTIN) 100 MG capsule TAKE 1 CAPSULE BY MOUTH THREE TIMES DAILY 90 capsule 0   metoprolol tartrate (LOPRESSOR) 25 MG tablet Take 1 tablet (25 mg total) by mouth 2 (two) times daily. 180 tablet 3   rosuvastatin (CRESTOR) 10 MG tablet Take 1 tablet by mouth once daily 90 tablet 0   Tiotropium Bromide-Olodaterol 2.5-2.5 MCG/ACT AERS Inhale into the lungs.     rivaroxaban (XARELTO) 20 MG TABS tablet Take by mouth.     No current facility-administered medications on file prior to visit.    Review of Systems:  As per HPI- otherwise negative.   Physical Examination: Vitals:   04/12/23 1431  BP: 130/70  Pulse: 68  Resp: 18  Temp: 98 F (36.7 C)  SpO2: 98%   Vitals:   04/12/23 1431  Weight: 197 lb 12.8 oz (89.7 kg)  Height: 5\' 7"  (1.702 m)   Body mass index is 30.98 kg/m. Ideal Body Weight: Weight in (lb) to have BMI = 25: 159.3  GEN: no acute distress.  Mildly obese, looks well HEENT: Atraumatic, Normocephalic. Bilateral TM wnl, oropharynx normal.  PEERL,EOMI. there is some tenderness over the right temporomandibular joint with opening and closing jaw Ears and Nose: No external deformity. CV: RRR, No M/G/R. No JVD. No thrill. No extra heart sounds. PULM: CTA B, no wheezes, crackles, rhonchi. No retractions. No resp. distress. No accessory muscle use. ABD: S, NT, ND. No rebound. No HSM. EXTR: No c/c/e PSYCH: Normally  interactive. Conversant.   Results for orders placed or performed in visit on 04/12/23  POC COVID-19 BinaxNow   Collection Time: 04/12/23  3:10 PM  Result Value Ref Range   SARS Coronavirus 2 Ag Negative Negative  POCT Influenza A/B   Collection Time: 04/12/23  3:10 PM  Result Value Ref Range   Influenza A, POC Negative Negative   Influenza B, POC Negative Negative     Assessment and Plan: Chills - Plan: POC COVID-19 BinaxNow, POCT Influenza A/B, CBC, Comprehensive metabolic panel  Anxiety - Plan: clonazePAM (KLONOPIN) 0.5 MG tablet Patient seen today with somewhat vague symptoms of malaise.  Both he and his wife note they are not sure if he is sick or just worried about being sick with a surgery coming up.  He would like me to test for COVID and flu which I am glad to do.  He is negative for both.  I offered to do a chest x-ray but he declines at this time We did go ahead with some basic blood work which is pending as above and I refilled his clonazepam I encouraged him to discuss his TMJ symptoms with his dentist  I asked him to please let me know if not feeling better in the next couple of days, sooner if getting worse  Signed Abbe Amsterdam, MD  Addendum 12/13, received labs as below.  Patient does not have MyChart-I called and relayed results  Results for orders placed or performed in visit on 04/12/23  POC COVID-19 BinaxNow   Collection Time: 04/12/23  3:10 PM  Result Value Ref Range   SARS Coronavirus 2 Ag Negative Negative  POCT Influenza A/B   Collection Time: 04/12/23  3:10 PM  Result Value Ref Range   Influenza A, POC Negative Negative   Influenza B, POC Negative Negative  CBC   Collection Time: 04/12/23  3:13 PM  Result Value Ref Range   WBC 6.6 4.0 - 10.5 K/uL   RBC 4.85 4.22 -  5.81 Mil/uL   Platelets 293.0 150.0 - 400.0 K/uL   Hemoglobin 14.5 13.0 - 17.0 g/dL   HCT 16.1 09.6 - 04.5 %   MCV 88.5 78.0 - 100.0 fl   MCHC 33.9 30.0 - 36.0 g/dL   RDW 40.9  81.1 - 91.4 %  Comprehensive metabolic panel   Collection Time: 04/12/23  3:13 PM  Result Value Ref Range   Sodium 143 135 - 145 mEq/L   Potassium 4.2 3.5 - 5.1 mEq/L   Chloride 106 96 - 112 mEq/L   CO2 30 19 - 32 mEq/L   Glucose, Bld 72 70 - 99 mg/dL   BUN 9 6 - 23 mg/dL   Creatinine, Ser 7.82 0.40 - 1.50 mg/dL   Total Bilirubin 0.7 0.2 - 1.2 mg/dL   Alkaline Phosphatase 79 39 - 117 U/L   AST 15 0 - 37 U/L   ALT 13 0 - 53 U/L   Total Protein 7.0 6.0 - 8.3 g/dL   Albumin 4.2 3.5 - 5.2 g/dL   GFR 95.62 >13.08 mL/min   Calcium 9.0 8.4 - 10.5 mg/dL

## 2023-04-13 LAB — CBC
HCT: 42.9 % (ref 39.0–52.0)
Hemoglobin: 14.5 g/dL (ref 13.0–17.0)
MCHC: 33.9 g/dL (ref 30.0–36.0)
MCV: 88.5 fL (ref 78.0–100.0)
Platelets: 293 10*3/uL (ref 150.0–400.0)
RBC: 4.85 Mil/uL (ref 4.22–5.81)
RDW: 13.4 % (ref 11.5–15.5)
WBC: 6.6 10*3/uL (ref 4.0–10.5)

## 2023-04-13 LAB — COMPREHENSIVE METABOLIC PANEL
ALT: 13 U/L (ref 0–53)
AST: 15 U/L (ref 0–37)
Albumin: 4.2 g/dL (ref 3.5–5.2)
Alkaline Phosphatase: 79 U/L (ref 39–117)
BUN: 9 mg/dL (ref 6–23)
CO2: 30 meq/L (ref 19–32)
Calcium: 9 mg/dL (ref 8.4–10.5)
Chloride: 106 meq/L (ref 96–112)
Creatinine, Ser: 0.72 mg/dL (ref 0.40–1.50)
GFR: 90.13 mL/min (ref >60.00)
Glucose, Bld: 72 mg/dL (ref 70–99)
Potassium: 4.2 meq/L (ref 3.5–5.1)
Sodium: 143 meq/L (ref 135–145)
Total Bilirubin: 0.7 mg/dL (ref 0.2–1.2)
Total Protein: 7 g/dL (ref 6.0–8.3)

## 2023-04-17 DIAGNOSIS — H43311 Vitreous membranes and strands, right eye: Secondary | ICD-10-CM | POA: Diagnosis not present

## 2023-04-18 DIAGNOSIS — C679 Malignant neoplasm of bladder, unspecified: Secondary | ICD-10-CM | POA: Diagnosis not present

## 2023-04-18 DIAGNOSIS — I10 Essential (primary) hypertension: Secondary | ICD-10-CM | POA: Diagnosis not present

## 2023-04-18 DIAGNOSIS — E785 Hyperlipidemia, unspecified: Secondary | ICD-10-CM | POA: Diagnosis not present

## 2023-04-18 DIAGNOSIS — I471 Supraventricular tachycardia, unspecified: Secondary | ICD-10-CM | POA: Diagnosis not present

## 2023-04-18 DIAGNOSIS — Z87891 Personal history of nicotine dependence: Secondary | ICD-10-CM | POA: Diagnosis not present

## 2023-04-18 DIAGNOSIS — C674 Malignant neoplasm of posterior wall of bladder: Secondary | ICD-10-CM | POA: Diagnosis not present

## 2023-04-18 DIAGNOSIS — I340 Nonrheumatic mitral (valve) insufficiency: Secondary | ICD-10-CM | POA: Diagnosis not present

## 2023-04-18 DIAGNOSIS — C641 Malignant neoplasm of right kidney, except renal pelvis: Secondary | ICD-10-CM | POA: Diagnosis not present

## 2023-04-18 DIAGNOSIS — R339 Retention of urine, unspecified: Secondary | ICD-10-CM | POA: Diagnosis not present

## 2023-04-18 DIAGNOSIS — I361 Nonrheumatic tricuspid (valve) insufficiency: Secondary | ICD-10-CM | POA: Diagnosis not present

## 2023-04-18 DIAGNOSIS — I4892 Unspecified atrial flutter: Secondary | ICD-10-CM | POA: Diagnosis not present

## 2023-04-18 DIAGNOSIS — N323 Diverticulum of bladder: Secondary | ICD-10-CM | POA: Diagnosis not present

## 2023-04-18 DIAGNOSIS — D414 Neoplasm of uncertain behavior of bladder: Secondary | ICD-10-CM | POA: Diagnosis not present

## 2023-04-18 DIAGNOSIS — Z7901 Long term (current) use of anticoagulants: Secondary | ICD-10-CM | POA: Diagnosis not present

## 2023-04-18 DIAGNOSIS — Z79899 Other long term (current) drug therapy: Secondary | ICD-10-CM | POA: Diagnosis not present

## 2023-04-18 DIAGNOSIS — K219 Gastro-esophageal reflux disease without esophagitis: Secondary | ICD-10-CM | POA: Diagnosis not present

## 2023-04-18 DIAGNOSIS — Z8616 Personal history of COVID-19: Secondary | ICD-10-CM | POA: Diagnosis not present

## 2023-04-18 DIAGNOSIS — I2510 Atherosclerotic heart disease of native coronary artery without angina pectoris: Secondary | ICD-10-CM | POA: Diagnosis not present

## 2023-04-18 DIAGNOSIS — J449 Chronic obstructive pulmonary disease, unspecified: Secondary | ICD-10-CM | POA: Diagnosis not present

## 2023-04-20 ENCOUNTER — Other Ambulatory Visit: Payer: Self-pay | Admitting: Family Medicine

## 2023-04-20 DIAGNOSIS — F419 Anxiety disorder, unspecified: Secondary | ICD-10-CM

## 2023-04-23 ENCOUNTER — Other Ambulatory Visit: Payer: Self-pay | Admitting: Medical

## 2023-04-23 ENCOUNTER — Ambulatory Visit: Payer: Self-pay | Admitting: Medical

## 2023-04-23 ENCOUNTER — Telehealth: Payer: Self-pay | Admitting: Medical

## 2023-04-23 DIAGNOSIS — F419 Anxiety disorder, unspecified: Secondary | ICD-10-CM

## 2023-04-23 MED ORDER — CLONAZEPAM 0.5 MG PO TABS: 0.5000 mg | ORAL_TABLET | Freq: Two times a day (BID) | ORAL | 0 refills | Status: DC | PRN

## 2023-04-23 NOTE — Telephone Encounter (Signed)
Refer to other telephone note.

## 2023-04-23 NOTE — Telephone Encounter (Signed)
Let pt know that based on frequency of his refills and quantity I will give him 40 tab prescription. Advise him that I want to see if that would be enough to last entire month. Ideally goal is to provide just one rx a month.  He is up to date on contract and uds. Have him follow up in one month to discuss quantity going forward.

## 2023-04-23 NOTE — Telephone Encounter (Signed)
Requesting:klonopin Contract:08/29/22 UDS:03/19/23 Last Visit:04/12/23 Next Visit:n/a Last Refill:04/12/23  Please Advise

## 2023-04-23 NOTE — Telephone Encounter (Signed)
Reason for Disposition  Caller requesting a CONTROLLED substance prescription refill (e.g., narcotics, ADHD medicines)  Answer Assessment - Initial Assessment Questions 1. DRUG NAME: "What medicine do you need to have refilled?"     Clonazepam 2. REFILLS REMAINING: "How many refills are remaining?" (Note: The label on the medicine or pill bottle will show how many refills are remaining. If there are no refills remaining, then a renewal may be needed.)     0 3. EXPIRATION DATE: "What is the expiration date?" (Note: The label states when the prescription will expire, and thus can no longer be refilled.)     Unsure 4. PRESCRIBING HCP: "Who prescribed it?" Reason: If prescribed by specialist, call should be referred to that group.     Dr. Ramon Dredge Saguier 5. SYMPTOMS: "Do you have any symptoms?"     Nervousness 6. PREGNANCY: "Is there any chance that you are pregnant?" "When was your last menstrual period?"     N/A  Protocols used: Medication Refill and Renewal Call-A-AH  Chief Complaint: Medication Refill Disposition: [] ED /[] Urgent Care (no appt availability in office) / [] Appointment(In office/virtual)/ []  [place] [facility]/ [] Home Care/ [] Refused Recommended Disposition /[]  Mobile Bus/ [x]  Follow-up with PCP Additional Notes: Call placed to patient, no answer, callback queued.    Spoke with Mrs. Whittinghill, Approved DPR, regarding a refill for Clonazepam. Mrs. Tygart states the patient denies having any active symptoms; however, is more nervous than usual. Spoke with [facility], chart will be reviewed for prescription prerequisites. Clinic will reach out to patient if needed for scheduling.

## 2023-04-23 NOTE — Telephone Encounter (Deleted)
  Chief Complaint: Medication Refill Symptoms: *** Pertinent Negatives: Patient denies *** Disposition: [] ED /[] Urgent Care (no appt availability in office) / [] Appointment(In office/virtual)/ []  [place] [facility]/ [] Home Care/ [] Refused Recommended Disposition /[] [place] [facility]/ [x]  Follow-up with PCP Additional Notes: Patrick Morris is a 72 year old male requesting a refill for

## 2023-04-23 NOTE — Telephone Encounter (Signed)
Copied from CRM [id number]. Topic: Clinical - Medication Refill >> Apr 23, 2023  9:54 AM Louie Casa B wrote: Most Recent Primary Care Visit:  Provider: Pearline Cables  Department: [facility]  Visit Type: OFFICE VISIT  Date: 04/12/2023  Medication: ***  Has the patient contacted their pharmacy?  (Agent: If no, request that the patient contact the pharmacy for the refill. If patient does not wish to contact the pharmacy document the reason why and proceed with request.) (Agent: If yes, when and what did the pharmacy advise?)  Is this the correct pharmacy for this prescription?  If no, delete pharmacy and type the correct one.  This is the patient's preferred pharmacy:  [facility] [address] [place], [place] - [address] [place] [address] Phone: [phone] Fax: [phone]   Has the prescription been filled recently?   Is the patient out of the medication?   Has the patient been seen for an appointment in the last year OR does the patient have an upcoming appointment?   Can we respond through MyChart?   Agent: Please be advised that Rx refills may take up to 3 business days. We ask that you follow-up with your pharmacy.

## 2023-04-23 NOTE — Telephone Encounter (Deleted)
  Chief Complaint: Medication Refill Disposition: [] ED /[] Urgent Care (no appt availability in office) / [] Appointment(In office/virtual)/ []  [place] [facility]/ [] Home Care/ [] Refused Recommended Disposition /[] [place] [facility]/ []  Follow-up with PCP Additional Notes: Call placed to patient, no answer, callback queued.   Spoke with Mrs. Langland, Approved DPR, regarding a refill for Clonazepam.

## 2023-05-04 ENCOUNTER — Other Ambulatory Visit: Payer: Self-pay | Admitting: Medical

## 2023-05-06 NOTE — Progress Notes (Signed)
 Cardiology Office Note:  .   Date:  05/08/2023  ID:  Beryl JONELLE Mariner, DOB May 16, 1950, MRN [id number] PCP: Dorina Dallas RIGGERS  [facility] Providers Cardiologist:  None    History of Present Illness: .   NAZIRE FRUTH is a 73 y.o. 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  02/15/2023 Lexiscan  normal, low risk study 02/06/2023 echo EF 50 to 55%, mild concentric LVH, mild MR, mild thickening of the aortic valve 03/29/2020 long-term monitor-average heart rate 76 bpm, no pauses greater than 3 seconds, episodes of slow atrial flutter, atrial tachycardia, triggered events 03/22/2020 Lexiscan  without evidence of ischemia  Most recently evaluated by Dr. Monetta on 02/22/2022, was doing well from a cardiac perspective, although he was in atrial flutter.  He did have ongoing hematuria and was in the process of an upcoming prostate surgery so he is not anticoagulated.   Evaluated by Dr. Monetta on 02/28/2023, he had previously been started on flecainide  and was feeling better.  He had no recurrent issues with hematuria on his anticoagulant, and it was advised to follow-up in 6 months.  He presents today for follow-up after he recently had a CT of his chest which revealed coronary artery calcification.  We discussed that this was a known finding based on coronary CTA in September 2024--FFR could not be determined so we will arrange for Lexiscan  which was abnormal, low risk study.  He did purchase a smart watch, on occasion checks his heart rate, he is not entirely sure how often he is supposed to do this, we did review how to check it in the office today.  He has no formal complaints from a cardiac perspective, stays active around his home.  He has occasional episodes of stable angina. He denies chest pain, palpitations, dyspnea, pnd, orthopnea, n, v, dizziness, syncope, edema, weight gain, or  early satiety.   MND:Mzcpzt of Systems  Cardiovascular:  Positive for chest pain.  Musculoskeletal:  Positive for back pain.  Endo/Heme/Allergies:  Bruises/bleeds easily.  All other systems reviewed and are negative.    Studies Reviewed: .        Cardiac Studies & Procedures     STRESS TESTS  MYOCARDIAL PERFUSION IMAGING 02/15/2023  Narrative   The study is normal. The study is low risk.   No ST deviation was noted.   Left ventricular function is normal. Nuclear stress EF: 53%. The left ventricular ejection fraction is mildly decreased (45-54%). End diastolic cavity size is normal.   Prior study available for comparison from 03/22/2020.  ECHOCARDIOGRAM  ECHOCARDIOGRAM COMPLETE 02/06/2023  Narrative ECHOCARDIOGRAM REPORT    Patient Name:   EUGEN JEANSONNE Wuest Date of Exam: 02/06/2023 Medical Rec #:  [id number]     Height:       67.0 in Accession #:    [id number]    Weight:       201.2 lb Date of Birth:  10-25-1950     BSA:          2.027 m Patient Age:    72 years      BP:           118/70 mmHg Patient Gender: M             HR:           72 bpm. Exam Location:  [facility]  Procedure: 2D Echo, 3D Echo, Cardiac Doppler  and Color Doppler  Indications:    Atypical atrial flutter (HCC) [I48.4 (ICD-10-CM)]; Dyspnea on exertion [R06.09 (ICD-10-CM)]  History:        Patient has no prior history of Echocardiogram examinations. COPD and GERD, Arrythmias:Atrial Flutter, Signs/Symptoms:Dyspnea; Risk Factors:Dyslipidemia, Former Smoker and Hypertension.  Sonographer:    Alan Greenhouse RDMS, RVT, RDCS Referring Phys: [id number] RAJAN R [facility]  IMPRESSIONS   1. Rhythm strip during this exam demonstrates atrial flutter. 2. Left ventricular ejection fraction, by estimation, is 50 to 55% with beat to beat variability. The left ventricle has low normal function. The left ventricle has no regional wall motion abnormalities. There is mild concentric left ventricular hypertrophy. Left  ventricular diastolic parameters are indeterminate. 3. Right ventricular systolic function is normal. The right ventricular size is normal. There is normal pulmonary artery systolic pressure. 4. The mitral valve is grossly normal. Mild mitral valve regurgitation. No evidence of mitral stenosis. 5. The aortic valve is tricuspid. There is mild thickening of the aortic valve. Aortic valve regurgitation is not visualized. No aortic stenosis is present. 6. The inferior vena cava is normal in size with greater than 50% respiratory variability, suggesting right atrial pressure of 3 mmHg.  Comparison(s): No prior Echocardiogram.  FINDINGS Left Ventricle: Left ventricular ejection fraction, by estimation, is 50 to 55%. The left ventricle has low normal function. The left ventricle has no regional wall motion abnormalities. The left ventricular internal cavity size was normal in size. There is mild concentric left ventricular hypertrophy. Left ventricular diastolic parameters are indeterminate.  Right Ventricle: The right ventricular size is normal. Right vetricular wall thickness was not well visualized. Right ventricular systolic function is normal. There is normal pulmonary artery systolic pressure. The tricuspid regurgitant velocity is 2.29 m/s, and with an assumed right atrial pressure of 3 mmHg, the estimated right ventricular systolic pressure is 24.0 mmHg.  Left Atrium: Left atrial size was normal in size.  Right Atrium: Right atrial size was normal in size.  Pericardium: There is no evidence of pericardial effusion.  Mitral Valve: The mitral valve is grossly normal. Mild mitral valve regurgitation. No evidence of mitral valve stenosis.  Tricuspid Valve: The tricuspid valve is not well visualized. Tricuspid valve regurgitation is mild.  Aortic Valve: The aortic valve is tricuspid. There is mild thickening of the aortic valve. Aortic valve regurgitation is not visualized. No aortic stenosis is  present. Aortic valve mean gradient measures 5.5 mmHg. Aortic valve peak gradient measures 9.6 mmHg. Aortic valve area, by VTI measures 2.34 cm.  Pulmonic Valve: The pulmonic valve was not well visualized. Pulmonic valve regurgitation is trivial.  Aorta: The aortic root and ascending aorta are structurally normal, with no evidence of dilitation.  Venous: The inferior vena cava is normal in size with greater than 50% respiratory variability, suggesting right atrial pressure of 3 mmHg.  IAS/Shunts: The interatrial septum was not well visualized.  EKG: Rhythm strip during this exam demonstrates atrial flutter.   LEFT VENTRICLE PLAX 2D LVIDd:         4.50 cm      Diastology LVIDs:         3.10 cm      LV e' medial:    7.72 cm/s LV PW:         1.10 cm      LV E/e' medial:  16.1 LV IVS:        1.10 cm      LV e' lateral:   11.50 cm/s LVOT diam:  2.10 cm      LV E/e' lateral: 10.8 LV SV:         83 LV SV Index:   41 LVOT Area:     3.46 cm  3D Volume EF: LV Volumes (MOD)            3D EF:        45 % LV vol d, MOD A2C: 136.5 ml LV EDV:       149 ml LV vol d, MOD A4C: 114.5 ml LV ESV:       82 ml LV vol s, MOD A2C: 71.0 ml  LV SV:        66 ml LV vol s, MOD A4C: 61.3 ml LV SV MOD A2C:     65.6 ml LV SV MOD A4C:     114.5 ml LV SV MOD BP:      61.2 ml  RIGHT VENTRICLE RV S prime:     11.09 cm/s TAPSE (M-mode): 1.5 cm  LEFT ATRIUM           Index        RIGHT ATRIUM           Index LA diam:      4.30 cm 2.12 cm/m   RA Area:     17.10 cm LA Vol (A2C): 48.7 ml 24.02 ml/m  RA Volume:   43.00 ml  21.21 ml/m LA Vol (A4C): 54.4 ml 26.83 ml/m AORTIC VALVE AV Area (Vmax):    2.35 cm AV Area (Vmean):   2.26 cm AV Area (VTI):     2.34 cm AV Vmax:           154.80 cm/s AV Vmean:          109.500 cm/s AV VTI:            0.354 m AV Peak Grad:      9.6 mmHg AV Mean Grad:      5.5 mmHg LVOT Vmax:         104.90 cm/s LVOT Vmean:        71.500 cm/s LVOT VTI:          0.239  m LVOT/AV VTI ratio: 0.68  AORTA Ao Root diam: 3.60 cm Ao Asc diam:  3.20 cm  MITRAL VALVE                TRICUSPID VALVE MV Area (PHT): 5.20 cm     TR Peak grad:   21.0 mmHg MV Decel Time: 146 msec     TR Vmax:        229.00 cm/s MR Peak grad: 89.3 mmHg MR Vmax:      472.50 cm/s   SHUNTS MV E velocity: 124.25 cm/s  Systemic VTI:  0.24 m MV A velocity: 90.25 cm/s   Systemic Diam: 2.10 cm MV E/A ratio:  1.38  Sreedhar reddy Madireddy Electronically signed by Alean reddy Madireddy Signature Date/Time: 02/06/2023/10:41:55 AM    Final   MONITORS  LONG TERM MONITOR-LIVE TELEMETRY (3-14 DAYS) 01/10/2023  Narrative Patch Wear Time:  10 days and 18 hours (2024-07-10T10:02:01-399 to 2024-09-03T11:15:39-0400)  Monitor 1 Patient had a min HR of 51 bpm, max HR of 167 bpm, and avg HR of 75 bpm. Predominant underlying rhythm was Sinus Rhythm. First Degree AV Block was present. 50 Supraventricular Tachycardia runs occurred, the run with the fastest interval lasting 15 beats with a max rate of 167 bpm, the longest lasting 2 hours 45 mins with an  avg rate of 94 bpm. Some episodes of Supraventricular Tachycardia may be possible Atrial Tachycardia with variable block. Supraventricular Tachycardia was detected within +/- 45 seconds of symptomatic patient event(s). Isolated SVEs were rare (<1.0%), SVE Couplets were rare (<1.0%), and SVE Triplets were rare (<1.0%). Isolated VEs were rare (<1.0%), and no VE Couplets or VE Triplets were present. Inverted QRS complexes possibly due to inverted placement of device.  Monitor 2 Atrial Flutter occurred continuously (100% burden), ranging from 43-139 bpm (avg of 83 bpm). Isolated VEs were rare (<1.0%), VE Couplets were rare (<1.0%), and no VE Triplets were present. Previously notified: MD notification criteria for First Documentation of Atrial Flutter met - Notified DeDe H RN on 26 Dec 2022 at 2:17 PM ET (KT).           Risk Assessment/Calculations:     CHA2DS2-VASc Score = 2   This indicates a 2.2% annual risk of stroke. The patient's score is based upon: CHF History: 0 HTN History: 0 Diabetes History: 0 Stroke History: 0 Vascular Disease History: 1 Age Score: 1 Gender Score: 0            Physical Exam:   VS:  BP 118/78 (BP Location: Right Arm, Patient Position: Sitting, Cuff Size: Normal)   Pulse (!) 56   Ht 5' 7 (1.702 m)   Wt 195 lb (88.5 kg)   SpO2 94%   BMI 30.54 kg/m    Wt Readings from Last 3 Encounters:  05/08/23 195 lb (88.5 kg)  04/12/23 197 lb 12.8 oz (89.7 kg)  03/19/23 197 lb (89.4 kg)    GEN: Well nourished, well developed in no acute distress NECK: No JVD; No carotid bruits CARDIAC: RRR, no murmurs, rubs, gallops RESPIRATORY:  Clear to auscultation without rales, wheezing or rhonchi  ABDOMEN: Soft, non-tender, non-distended EXTREMITIES:  No edema; No deformity   ASSESSMENT AND PLAN: .   Atypical atrial flutter/hypercoagulable state/on Flecainide - CHA2DS2-VASc score of 2, continue with 5 mg twice daily-no indication for dose reduction--hemoglobin and hematocrit without signs of infection or anemia on 04/12/2023, creatinine 0.72.  He endorses some bleeding related to hemorrhoids on occasion.  Continue flecainide  50 mg twice daily.  Continue metoprolol  25 mg twice daily.  Coronary artery calcifications-noted on CT imaging and coronary CTA, we repeated a Lexiscan  after his coronary which was low risk, normal study.  Continue metoprolol  25 mg twice daily, continue Crestor  10 mg daily.  We will arrange for nitroglycerin , discussed how and when to use it, as well as ED precautions.   Bladder cancer- follows with [facility], will see them in a few months.   HLD -most recent LDL well-controlled at 64, continue Crestor  10 mg daily.       Dispo: NTG PRN, keep recall with Dr. Monetta.  Signed, Delon JAYSON Hoover, NP

## 2023-05-08 ENCOUNTER — Encounter: Payer: Self-pay | Admitting: Cardiology

## 2023-05-08 ENCOUNTER — Ambulatory Visit: Payer: Medicare Other | Attending: Cardiology | Admitting: Cardiology

## 2023-05-08 VITALS — BP 118/78 | HR 56 | Ht 67.0 in | Wt 195.0 lb

## 2023-05-08 DIAGNOSIS — E785 Hyperlipidemia, unspecified: Secondary | ICD-10-CM

## 2023-05-08 DIAGNOSIS — D414 Neoplasm of uncertain behavior of bladder: Secondary | ICD-10-CM | POA: Diagnosis not present

## 2023-05-08 DIAGNOSIS — Z79899 Other long term (current) drug therapy: Secondary | ICD-10-CM | POA: Diagnosis not present

## 2023-05-08 DIAGNOSIS — I2510 Atherosclerotic heart disease of native coronary artery without angina pectoris: Secondary | ICD-10-CM | POA: Diagnosis not present

## 2023-05-08 DIAGNOSIS — Z7901 Long term (current) use of anticoagulants: Secondary | ICD-10-CM

## 2023-05-08 DIAGNOSIS — I484 Atypical atrial flutter: Secondary | ICD-10-CM | POA: Diagnosis not present

## 2023-05-08 MED ORDER — NITROGLYCERIN 0.4 MG SL SUBL: 0.4000 mg | SUBLINGUAL_TABLET | SUBLINGUAL | 6 refills | Status: DC | PRN

## 2023-05-08 NOTE — Patient Instructions (Signed)
 Medication Instructions:    Your physician has recommended you make the following change in your medication:  Use nitroglycerin  1 tablet placed under the tongue at the first sign of chest pain or an angina attack. 1 tablet may be used every 5 minutes as needed, for up to 15 minutes. Do not take more than 3 tablets in 15 minutes. If pain persist call 911 or go to the nearest ED.  *If you need a refill on your cardiac medications before your next appointment, please call your pharmacy*   Lab Work: None Ordered If you have labs (blood work) drawn today and your tests are completely normal, you will receive your results only by: MyChart Message (if you have MyChart) OR A paper copy in the mail If you have any lab test that is abnormal or we need to change your treatment, we will call you to review the results.   Testing/Procedures: None Ordered   Follow-Up: At [facility], you and your health needs are our priority.  As part of our continuing mission to provide you with exceptional heart care, we have created designated Provider Care Teams.  These Care Teams include your primary Cardiologist (physician) and Advanced Practice Providers (APPs -  Physician Assistants and Nurse Practitioners) who all work together to provide you with the care you need, when you need it.  We recommend signing up for the patient portal called MyChart.  Sign up information is provided on this After Visit Summary.  MyChart is used to connect with patients for Virtual Visits (Telemedicine).  Patients are able to view lab/test results, encounter notes, upcoming appointments, etc.  Non-urgent messages can be sent to your provider as well.   To learn more about what you can do with MyChart, go to [email or web address].    Your next appointment:   Follow up in April 2025Nitroglycerin Sublingual Tablets What is this medication? NITROGLYCERIN  (nye troe GLI ser in) prevents and treats chest pain (angina). It works by  relaxing blood vessels, which decreases the amount of work the heart has to do. It belongs to a group of medications called nitrates. This medicine may be used for other purposes; ask your health care provider or pharmacist if you have questions. COMMON BRAND NAME(S): Nitroquick, Nitrostat , Nitrotab What should I tell my care team before I take this medication? They need to know if you have any of these conditions: Anemia Head injury, recent stroke, or bleeding in the brain Liver disease Previous heart attack An unusual or allergic reaction to nitroglycerin , other medications, foods, dyes, or preservatives Pregnant or trying to get pregnant Breast-feeding How should I use this medication? Take this medication by mouth as needed. Use at the first sign of an angina attack (chest pain or tightness). You can also take this medication 5 to 10 minutes before an event likely to produce chest pain. Follow the directions exactly as written on the prescription label. Place one tablet under your tongue and let it dissolve. Do not swallow whole. Replace the dose if you accidentally swallow it. It will help if your mouth is not dry. Saliva around the tablet will help it to dissolve more quickly. Do not eat or drink, smoke or chew tobacco while a tablet is dissolving. Sit down when taking this medication. In an angina attack, you should feel better within 5 minutes after your first dose. You can take a dose every 5 minutes up to a total of 3 doses. If you do not feel better or feel  worse after 1 dose, call 9-1-1 at once. Do not take more than 3 doses in 15 minutes. Your care team might give you other directions. Follow those directions if they do. Do not take your medication more often than directed. Talk to your care team about the use of this medication in children. Special care may be needed. Overdosage: If you think you have taken too much of this medicine contact a poison control center or emergency room at  once. NOTE: This medicine is only for you. Do not share this medicine with others. What if I miss a dose? This does not apply. This medication is only used as needed. What may interact with this medication? Do not take this medication with any of the following: Certain migraine medications, such as ergotamine and dihydroergotamine (DHE) Medications used to treat erectile dysfunction, such as sildenafil, tadalafil, and vardenafil Riociguat This medication may also interact with the following: Alteplase Aspirin Heparin  Medications for blood pressure Medications for depression Other medications used to treat angina Phenothiazines, such as chlorpromazine, mesoridazine, prochlorperazine, thioridazine This list may not describe all possible interactions. Give your health care provider a list of all the medicines, herbs, non-prescription drugs, or dietary supplements you use. Also tell them if you smoke, drink alcohol, or use illegal drugs. Some items may interact with your medicine. What should I watch for while using this medication? Tell your care team if you feel your medication is no longer working. Keep this medication with you at all times. Sit or lie down when you take your medication to prevent falling if you feel dizzy or faint after using it. Try to remain calm. This will help you to feel better faster. If you feel dizzy, take several deep breaths and lie down with your feet propped up, or bend forward with your head resting between your knees. This medication may affect your coordination, reaction time, or judgment. Do not drive or operate machinery until you know how this medication affects you. Sit up or stand slowly to reduce the risk of dizzy or fainting spells. Drinking alcohol with this medication can increase the risk of these side effects. Do not treat yourself for coughs, colds, or pain while you are taking this medication without asking your care team for advice. Some ingredients  may increase your blood pressure. What side effects may I notice from receiving this medication? Side effects that you should report to your care team as soon as possible: Allergic reactions--skin rash, itching, hives, swelling of the face, lips, tongue, or throat Headache, unusual weakness or fatigue, shortness of breath, nausea, vomiting, rapid heartbeat, blue skin or lips, which may be signs of methemoglobinemia Increased pressure around the brain--severe headache, blurry vision, change in vision, nausea, vomiting Low blood pressure--dizziness, feeling faint or lightheaded, blurry vision Slow heartbeat--dizziness, feeling faint or lightheaded, confusion, trouble breathing, unusual weakness or fatigue Worsening chest pain (angina)--pain, pressure, or tightness in the chest, neck, back, or arms Side effects that usually do not require medical attention (report to your care team if they continue or are bothersome): Dizziness Flushing Headache This list may not describe all possible side effects. Call your doctor for medical advice about side effects. You may report side effects to FDA at 1-800-FDA-1088. Where should I keep my medication? Keep out of the reach of children. Store at room temperature between 20 and 25 degrees C (68 and 77 degrees F). Store in retail buyer. Protect from light and moisture. Keep tightly closed. Throw away any unused  medication after the expiration date. NOTE: This sheet is a summary. It may not cover all possible information. If you have questions about this medicine, talk to your doctor, pharmacist, or health care provider.  2024 Elsevier/Gold Standard (2022-04-17 00:00:00)

## 2023-05-14 ENCOUNTER — Other Ambulatory Visit: Payer: Self-pay | Admitting: Medical

## 2023-05-14 DIAGNOSIS — F419 Anxiety disorder, unspecified: Secondary | ICD-10-CM

## 2023-05-14 NOTE — Telephone Encounter (Signed)
 Requesting: clonazepam 0.5mg   Contract:03/19/23 UDS: 03/19/23 Last Visit: 03/19/23 Next Visit: None Last Refill: 04/23/23 #40 and 0RF    Please Advise

## 2023-05-27 ENCOUNTER — Other Ambulatory Visit: Payer: Self-pay | Admitting: Family

## 2023-05-27 DIAGNOSIS — F419 Anxiety disorder, unspecified: Secondary | ICD-10-CM

## 2023-05-29 ENCOUNTER — Other Ambulatory Visit: Payer: Self-pay | Admitting: Medical

## 2023-05-29 DIAGNOSIS — F419 Anxiety disorder, unspecified: Secondary | ICD-10-CM

## 2023-05-29 MED ORDER — CLONAZEPAM 0.5 MG PO TABS: 0.5000 mg | ORAL_TABLET | Freq: Two times a day (BID) | ORAL | 0 refills | Status: DC | PRN

## 2023-05-29 NOTE — Telephone Encounter (Signed)
Requesting: clonazepam 0.5mg   Contract:03/19/23 UDS: 03/19/23 Last Visit: 03/19/23 Next Visit: None Last Refill: 04/23/23 #40 and 0RF    Please Advise

## 2023-05-29 NOTE — Telephone Encounter (Signed)
Patient and wife was advised and scheduled appointment for tomorrow, 05/30/23 @ 10:20 AM.

## 2023-05-29 NOTE — Telephone Encounter (Signed)
I sent in clonazepam. Then checked and appears most recent prescription was on 05/14/23 from [facility]. Please check the controlled. Med web site. Please call [facility] and advise hold/ fill rx on Jun 04, 2023 when should be due.

## 2023-05-29 NOTE — Telephone Encounter (Signed)
Copied from CRM [id number]. Topic: Clinical - Medication Refill >> May 29, 2023 11:55 AM Drema Balzarine wrote: Most Recent Primary Care Visit:  Provider: Pearline Cables  Department: [facility]  Visit Type: OFFICE VISIT  Date: 04/12/2023  Medication: clonazePAM   Has the patient contacted their pharmacy? Yes (Agent: If no, request that the patient contact the pharmacy for the refill. If patient does not wish to contact the pharmacy document the reason why and proceed with request.) (Agent: If yes, when and what did the pharmacy advise?)  Is this the correct pharmacy for this prescription? Yes If no, delete pharmacy and type the correct one.  This is the patient's preferred pharmacy:   [facility] [address] [place], [place] - [address] [place] [address] Phone: [phone] Fax: [phone]   Has the prescription been filled recently? Yes  Is the patient out of the medication? Yes, took last pill yesterday 1/27  Has the patient been seen for an appointment in the last year OR does the patient have an upcoming appointment? Yes  Can we respond through MyChart? No  Agent: Please be advised that Rx refills may take up to 3 business days. We ask that you follow-up with your pharmacy.

## 2023-05-30 ENCOUNTER — Encounter: Payer: Self-pay | Admitting: Medical

## 2023-05-30 ENCOUNTER — Ambulatory Visit (INDEPENDENT_AMBULATORY_CARE_PROVIDER_SITE_OTHER): Payer: 59 | Admitting: Medical

## 2023-05-30 VITALS — BP 127/70 | HR 59 | Temp 98.1°F | Resp 18 | Ht 67.0 in | Wt 194.0 lb

## 2023-05-30 DIAGNOSIS — G8929 Other chronic pain: Secondary | ICD-10-CM

## 2023-05-30 DIAGNOSIS — F419 Anxiety disorder, unspecified: Secondary | ICD-10-CM | POA: Diagnosis not present

## 2023-05-30 DIAGNOSIS — Z79899 Other long term (current) drug therapy: Secondary | ICD-10-CM | POA: Diagnosis not present

## 2023-05-30 DIAGNOSIS — M5440 Lumbago with sciatica, unspecified side: Secondary | ICD-10-CM

## 2023-05-30 NOTE — Progress Notes (Signed)
   Subjective:    Patient ID: Patrick Morris, male    DOB: Nov 21, 1950, 73 y.o.   MRN: [id number]  HPI Discussed the use of AI scribe software for clinical note transcription with the patient, who gave verbal consent to proceed.  History of Present Illness   The patient presents with anxiety and requests a refill of clonazepam.  He is experiencing anxiety and is seeking a refill of clonazepam. He currently has about five or six days' worth of medication left. He uses clonazepam as needed, not necessarily every day. Was getting just 40 tab a month in past but with increased anxiety he now expressed wants 60 tabs a month.  He has chronic back pain, which contributes to his stress and anxiety. He mentions having old L4 and L5 fractures identified in an x-ray from April of the previous year. He was referred to a spine specialist and attended an appointment, but declined recommended physical therapy. He manages his back pain with over-the-counter topical treatments like Salonpas with lidocaine and takes Tylenol, as he cannot use anti-inflammatories due to being on Eliquis.  He has a history of cancer, including lung cancer and recurrent bladder cancer. He underwent surgery for a bladder tumor before Christmas and received intravesical chemotherapy. He is concerned about the high risk of recurrence and has decided against further surgeries if the tumor returns.  He is also being treated for COPD by a pulmonologist, who has adjusted his inhaler medications. He reports no current evidence of lung cancer after a recent evaluation.        Review of Systems See hpi    Objective:   Physical Exam  General- No acute distress. Pleasant patient. Neck- Full range of motion, no jvd Lungs- Clear, even and unlabored. Heart- regular rate and rhythm. Neurologic- CNII- XII grossly intact.       Assessment & Plan:   Assessment and Plan    Anxiety Chronic anxiety managed with Clonazepam. Discussed the  importance of consistent dosing and the risks associated with overuse due to its controlled status. -Increase Clonazepam to 60 tablets per month, to be used twice daily as needed. 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 Non-compliance with physical therapy as recommended by neurosurgery. -Continue use of over-the-counter topical analgesics and heat pad. Tylenol but avoid nsaids due to eliquis use. -Consider use of an elastic back brace for additional support.  Bladder Cancer History of recurrent bladder cancer with recent surgery and intravesical chemotherapy. Patient declines further surgical intervention if recurrence. -Continue follow-up with urology as directed.  Osteopenia Managed with Fosamax 35mg  weekly. -Continue Fosamax as directed.  COPD Managed by pulmonology with inhalers. -Continue follow-up with pulmonology as directed.  Follow-up in 3 months or sooner if needed.

## 2023-05-30 NOTE — Patient Instructions (Signed)
Anxiety Chronic anxiety managed with Clonazepam. Discussed the importance of consistent dosing and the risks associated with overuse due to its controlled status. -Increase Clonazepam to 60 tablets per month, to be used twice daily as needed. 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 Non-compliance with physical therapy as recommended by neurosurgery. -Continue use of over-the-counter topical analgesics and heat pad. Tylenol but avoid nsaids due to eliquis use. -Consider use of an elastic back brace for additional support.  Bladder Cancer History of recurrent bladder cancer with recent surgery and intravesical chemotherapy. Patient declines further surgical intervention if recurrence. -Continue follow-up with urology as directed.  Osteopenia Managed with Fosamax 35mg  weekly. -Continue Fosamax as directed.  COPD Managed by pulmonology with inhalers. -Continue follow-up with pulmonology as directed.  Follow-up in 3 months or sooner if needed.

## 2023-06-05 ENCOUNTER — Telehealth: Payer: Self-pay | Admitting: Medical

## 2023-06-05 NOTE — Telephone Encounter (Signed)
 Copied from CRM [id number]. Topic: Clinical - Medication Refill >> Jun 05, 2023 12:24 PM Franky GRADE wrote: Most Recent Primary Care Visit:  Provider: DORINA LOVING  Department: [facility]  Visit Type: OFFICE VISIT  Date: 05/30/2023  Medication: Clonazepam  to 60 tablets per month  Has the patient contacted their pharmacy? No, per Dr.Saguier's notes call back when refill is needed.  (Agent: If no, request that the patient contact the pharmacy for the refill. If patient does not wish to contact the pharmacy document the reason why and proceed with request.) (Agent: If yes, when and what did the pharmacy advise?)  Is this the correct pharmacy for this prescription? Yes If no, delete pharmacy and type the correct one.  This is the patient's preferred pharmacy:  [facility] [address] [place], [place] - [address] [place] [address] Phone: [phone] Fax: [phone]   Has the prescription been filled recently? Yes  Is the patient out of the medication? No  Has the patient been seen for an appointment in the last year OR does the patient have an upcoming appointment? Yes  Can we respond through MyChart? No  Agent: Please be advised that Rx refills may take up to 3 business days. We ask that you follow-up with your pharmacy.

## 2023-06-05 NOTE — Telephone Encounter (Signed)
Last Fill: 05/29/23  Last OV: 05/30/23 Next OV: None Scheduled  Routing to provider for review/authorization.

## 2023-06-06 MED ORDER — CLONAZEPAM 0.5 MG PO TABS: 0.5000 mg | ORAL_TABLET | Freq: Two times a day (BID) | ORAL | 0 refills | Status: DC | PRN

## 2023-06-07 NOTE — Telephone Encounter (Signed)
 May have been read in error, this RN does not have access to [facility].

## 2023-06-14 ENCOUNTER — Other Ambulatory Visit: Payer: Self-pay | Admitting: Cardiology

## 2023-06-15 ENCOUNTER — Telehealth: Payer: Self-pay

## 2023-06-15 MED ORDER — ROSUVASTATIN CALCIUM 10 MG PO TABS: 10.0000 mg | ORAL_TABLET | Freq: Every day | ORAL | 2 refills | Status: AC

## 2023-06-15 NOTE — Telephone Encounter (Signed)
Prescription sent to pharmacy.

## 2023-06-19 DIAGNOSIS — J432 Centrilobular emphysema: Secondary | ICD-10-CM | POA: Diagnosis not present

## 2023-06-19 DIAGNOSIS — Z87891 Personal history of nicotine dependence: Secondary | ICD-10-CM | POA: Diagnosis not present

## 2023-06-21 ENCOUNTER — Other Ambulatory Visit: Payer: Self-pay | Admitting: Medical

## 2023-06-25 DIAGNOSIS — Z87891 Personal history of nicotine dependence: Secondary | ICD-10-CM | POA: Diagnosis not present

## 2023-06-25 DIAGNOSIS — R490 Dysphonia: Secondary | ICD-10-CM | POA: Diagnosis not present

## 2023-06-25 DIAGNOSIS — C342 Malignant neoplasm of middle lobe, bronchus or lung: Secondary | ICD-10-CM | POA: Diagnosis not present

## 2023-06-25 DIAGNOSIS — J029 Acute pharyngitis, unspecified: Secondary | ICD-10-CM | POA: Diagnosis not present

## 2023-06-25 DIAGNOSIS — E041 Nontoxic single thyroid nodule: Secondary | ICD-10-CM | POA: Diagnosis not present

## 2023-06-25 DIAGNOSIS — R911 Solitary pulmonary nodule: Secondary | ICD-10-CM | POA: Diagnosis not present

## 2023-06-25 DIAGNOSIS — J432 Centrilobular emphysema: Secondary | ICD-10-CM | POA: Diagnosis not present

## 2023-06-26 ENCOUNTER — Ambulatory Visit: Payer: Self-pay | Admitting: Medical

## 2023-06-26 ENCOUNTER — Encounter: Payer: Self-pay | Admitting: Physician Assistant

## 2023-06-26 ENCOUNTER — Ambulatory Visit (INDEPENDENT_AMBULATORY_CARE_PROVIDER_SITE_OTHER): Payer: 59 | Admitting: Physician Assistant

## 2023-06-26 VITALS — BP 114/60 | HR 94 | Temp 98.3°F | Resp 18 | Wt 198.4 lb

## 2023-06-26 DIAGNOSIS — J441 Chronic obstructive pulmonary disease with (acute) exacerbation: Secondary | ICD-10-CM

## 2023-06-26 DIAGNOSIS — R6889 Other general symptoms and signs: Secondary | ICD-10-CM

## 2023-06-26 LAB — POC COVID19 BINAXNOW: SARS Coronavirus 2 Ag: NEGATIVE

## 2023-06-26 LAB — POCT INFLUENZA A/B
Influenza A, POC: NEGATIVE
Influenza B, POC: NEGATIVE

## 2023-06-26 MED ORDER — PREDNISONE 20 MG PO TABS: 20.0000 mg | ORAL_TABLET | Freq: Every day | ORAL | 0 refills | Status: DC

## 2023-06-26 MED ORDER — DOXYCYCLINE HYCLATE 100 MG PO TABS: 100.0000 mg | ORAL_TABLET | Freq: Two times a day (BID) | ORAL | 0 refills | Status: AC

## 2023-06-26 NOTE — Telephone Encounter (Signed)
 Copied from CRM [id number]. Topic: Clinical - Red Word Triage >> Jun 26, 2023  9:21 AM Adele Barthel wrote: Red Word that prompted transfer to Nurse Triage:   Severe sore throat for past 4 days Redness present in throat Congested cough No difficulty breathing, history of COPD  Chief Complaint: sore throat Symptoms: sore/painful throat, redness to throat, hoarseness,  Frequency: x 4 days Pertinent Negatives: Patient denies fever Disposition: [] ED /[] Urgent Care (no appt availability in office) / [x] Appointment(In office/virtual)/ []  [facility]/ [] Home Care/ [] Refused Recommended Disposition /[] [facility]/ []  Follow-up with PCP Additional Notes: pt has hx of COPD therefore wife states he normally has a cough & SOB: at present time these are not worse.  Wife patient pt c/o throat to be very painful.  Reason for Disposition  SEVERE (e.g., excruciating) throat pain  Answer Assessment - Initial Assessment Questions 1. ONSET: "When did the throat start hurting?" (Hours or days ago)      X 4 days 2. SEVERITY: "How bad is the sore throat?" (Scale 1-10; mild, moderate or severe)   - MILD (1-3):  Doesn't interfere with eating or normal activities.   - MODERATE (4-7): Interferes with eating some solids and normal activities.   - SEVERE (8-10):  Excruciating pain, interferes with most normal activities.   - SEVERE WITH DYSPHAGIA (10): Can't swallow liquids, drooling.     Hurts bad 3. STREP EXPOSURE: "Has there been any exposure to strep within the past week?" If Yes, ask: "What type of contact occurred?"      no 4.  VIRAL SYMPTOMS: "Are there any symptoms of a cold, such as a runny nose, cough, hoarse voice or red eyes?"      Hoarse, red throat, pain in throat, cough & SOB- normal due to COPD,  5. FEVER: "Do you have a fever?" If Yes, ask: "What is your temperature, how was it measured, and when did it start?"     no 6. PUS ON THE TONSILS: "Is there pus on the tonsils in the  back of your throat?"     no 7. OTHER SYMPTOMS: "Do you have any other symptoms?" (e.g., difficulty breathing, headache, rash)     Headache - just a little 8. PREGNANCY: "Is there any chance you are pregnant?" "When was your last menstrual period?"     N/a  Protocols used: Sore Throat-A-AH

## 2023-06-26 NOTE — Progress Notes (Signed)
 Established patient visit   Patient: Patrick Morris   DOB: 07-10-1950   73 y.o. Male  MRN: [id number] Visit Date: 06/26/2023  Today's healthcare provider: Alfredia Ferguson, PA-C  Cc. Cough, congestion, headache x 3-4 days   Subjective     Pt, with a history of COPD, reports worsening cough and increased mucous production, headaches, chills, body aches, nasal congestion, rhinorrhea x 3-4 days.  Denies fevers.  He started a new inhaler-- trelegy, yesterday. He uses the albuterol inhaler prn.  Medications: Outpatient Medications Prior to Visit  Medication Sig   acetaminophen (TYLENOL) 500 MG tablet Take 1,000 mg by mouth every 6 (six) hours as needed for mild pain or moderate pain. For pain   albuterol (VENTOLIN HFA) 108 (90 Base) MCG/ACT inhaler Inhale into the lungs.   alendronate (FOSAMAX) 35 MG tablet Take 1 tablet (35 mg total) by mouth every 7 (seven) days. Take with a full glass of water on an empty stomach.   apixaban (ELIQUIS) 5 MG TABS tablet Take 5 mg by mouth 2 (two) times daily.   clonazePAM (KLONOPIN) 0.5 MG tablet Take 1 tablet (0.5 mg total) by mouth 2 (two) times daily as needed for anxiety.   flecainide (TAMBOCOR) 50 MG tablet Take 1 tablet (50 mg total) by mouth 2 (two) times daily.   fluticasone (FLONASE) 50 MCG/ACT nasal spray Use 2 spray(s) in each nostril once daily   gabapentin (NEURONTIN) 100 MG capsule TAKE 1 CAPSULE BY MOUTH THREE TIMES DAILY   metoprolol tartrate (LOPRESSOR) 25 MG tablet Take 1 tablet (25 mg total) by mouth 2 (two) times daily.   nitroGLYCERIN (NITROSTAT) 0.4 MG SL tablet Place 1 tablet (0.4 mg total) under the tongue every 5 (five) minutes as needed.   rosuvastatin (CRESTOR) 10 MG tablet Take 1 tablet (10 mg total) by mouth daily.   Tiotropium Bromide-Olodaterol 2.5-2.5 MCG/ACT AERS Inhale into the lungs.   No facility-administered medications prior to visit.    Review of Systems  Constitutional:  Positive for fatigue. Negative for  fever.  HENT:  Positive for congestion, rhinorrhea and sore throat.   Respiratory:  Positive for cough and shortness of breath.   Cardiovascular:  Negative for chest pain, palpitations and leg swelling.  Neurological:  Positive for dizziness. Negative for headaches.       Objective    BP 114/60 (BP Location: Left Arm, Patient Position: Sitting, Cuff Size: Normal)   Pulse 94   Temp 98.3 F (36.8 C) (Oral)   Resp 18   Wt 198 lb 6.4 oz (90 kg)   SpO2 94%   BMI 31.07 kg/m    Physical Exam Constitutional:      General: He is awake.     Appearance: He is well-developed.  HENT:     Head: Normocephalic.     Right Ear: Tympanic membrane normal.     Left Ear: Tympanic membrane normal.     Mouth/Throat:     Pharynx: Posterior oropharyngeal erythema present. No oropharyngeal exudate.  Eyes:     Conjunctiva/sclera: Conjunctivae normal.  Cardiovascular:     Rate and Rhythm: Normal rate and regular rhythm.     Heart sounds: Normal heart sounds.  Pulmonary:     Effort: Pulmonary effort is normal.     Breath sounds: Normal breath sounds. No wheezing, rhonchi or rales.  Skin:    General: Skin is warm.  Neurological:     Mental Status: He is alert and oriented to person, place,  and time.  Psychiatric:        Attention and Perception: Attention normal.        Mood and Affect: Mood normal.        Speech: Speech normal.        Behavior: Behavior is cooperative.     Results for orders placed or performed in visit on 06/26/23  POC COVID-19  Result Value Ref Range   SARS Coronavirus 2 Ag Negative Negative  POCT Influenza A/B  Result Value Ref Range   Influenza A, POC Negative Negative   Influenza B, POC Negative Negative    Assessment & Plan    COPD exacerbation (HCC) -     predniSONE; Take 1 tablet (20 mg total) by mouth daily with breakfast.  Dispense: 5 tablet; Refill: 0 -     Doxycycline Hyclate; Take 1 tablet (100 mg total) by mouth 2 (two) times daily for 7 days.   Dispense: 14 tablet; Refill: 0  Flu-like symptoms -     POC COVID-19 BinaxNow -     POCT Influenza A/B   Poc covid, flu negative.  Rx prednisone x 5 days, doxycycline x 7 days Encourage hydration, inhaler use.   Return if symptoms worsen or fail to improve.       Alfredia Ferguson, PA-C  [facility] [phone] (phone) [phone] (fax)  [facility]

## 2023-07-10 ENCOUNTER — Other Ambulatory Visit: Payer: Self-pay | Admitting: Medical

## 2023-07-10 NOTE — Telephone Encounter (Signed)
 Copied from CRM [id number]. Topic: Clinical - Medication Refill >> Jul 10, 2023 10:31 AM Elizebeth Brooking wrote: Most Recent Primary Care Visit:  Provider: Alfredia Ferguson  Department: [facility]  Visit Type: ACUTE  Date: 06/26/2023  Medication: clonazePAM (KLONOPIN) 0.5 MG tablet  Has the patient contacted their pharmacy? Yes (Agent: If no, request that the patient contact the pharmacy for the refill. If patient does not wish to contact the pharmacy document the reason why and proceed with request.) (Agent: If yes, when and what did the pharmacy advise?)  Is this the correct pharmacy for this prescription? Yes If no, delete pharmacy and type the correct one.  This is the patient's preferred pharmacy:  [facility] [address] [place], [place] - [address] [place] [address] Phone: [phone] Fax: [phone]   Has the prescription been filled recently? No  Is the patient out of the medication? Yes  Has the patient been seen for an appointment in the last year OR does the patient have an upcoming appointment? Yes  Can we respond through MyChart? Yes  Agent: Please be advised that Rx refills may take up to 3 business days. We ask that you follow-up with your pharmacy.

## 2023-07-10 NOTE — Telephone Encounter (Signed)
 Requesting: clonazepam 0.5mg  Contract: Yes  03/19/23 UDS: 03/19/23 Last Visit: 05/30/2023 Next Visit: 07/10/2023 Last Refill: 06/06/23  Please Advise

## 2023-07-11 NOTE — Telephone Encounter (Signed)
 Requesting: Klonopin 0.5 mg  Contract: 03/19/2023 UDS: 03/19/2023 Last Visit: 06/26/2023 Next Visit: N/A Last Refill: 06/06/2023  Please Advise

## 2023-07-12 MED ORDER — CLONAZEPAM 0.5 MG PO TABS
0.5000 mg | ORAL_TABLET | Freq: Two times a day (BID) | ORAL | 0 refills | Status: DC | PRN
Start: 2023-07-12 — End: 2023-08-10

## 2023-07-12 NOTE — Telephone Encounter (Signed)
 Refill sent to pharmacy.

## 2023-07-12 NOTE — Telephone Encounter (Signed)
 Just filled. Duplicate request rx declined.

## 2023-07-16 DIAGNOSIS — E041 Nontoxic single thyroid nodule: Secondary | ICD-10-CM | POA: Diagnosis not present

## 2023-07-19 ENCOUNTER — Other Ambulatory Visit: Payer: Self-pay | Admitting: Medical

## 2023-07-20 ENCOUNTER — Other Ambulatory Visit: Payer: Self-pay | Admitting: Medical

## 2023-07-23 DIAGNOSIS — E042 Nontoxic multinodular goiter: Secondary | ICD-10-CM | POA: Diagnosis not present

## 2023-07-23 DIAGNOSIS — E041 Nontoxic single thyroid nodule: Secondary | ICD-10-CM | POA: Diagnosis not present

## 2023-07-30 DIAGNOSIS — C672 Malignant neoplasm of lateral wall of bladder: Secondary | ICD-10-CM | POA: Diagnosis not present

## 2023-07-31 DIAGNOSIS — R8289 Other abnormal findings on cytological and histological examination of urine: Secondary | ICD-10-CM | POA: Diagnosis not present

## 2023-07-31 DIAGNOSIS — Z8551 Personal history of malignant neoplasm of bladder: Secondary | ICD-10-CM | POA: Diagnosis not present

## 2023-08-02 DIAGNOSIS — N281 Cyst of kidney, acquired: Secondary | ICD-10-CM | POA: Diagnosis not present

## 2023-08-02 DIAGNOSIS — N323 Diverticulum of bladder: Secondary | ICD-10-CM | POA: Diagnosis not present

## 2023-08-02 DIAGNOSIS — K8020 Calculus of gallbladder without cholecystitis without obstruction: Secondary | ICD-10-CM | POA: Diagnosis not present

## 2023-08-02 DIAGNOSIS — Z8551 Personal history of malignant neoplasm of bladder: Secondary | ICD-10-CM | POA: Diagnosis not present

## 2023-08-02 DIAGNOSIS — R319 Hematuria, unspecified: Secondary | ICD-10-CM | POA: Diagnosis not present

## 2023-08-10 ENCOUNTER — Other Ambulatory Visit: Payer: Self-pay | Admitting: Medical

## 2023-08-10 NOTE — Telephone Encounter (Signed)
 Requesting: clonazepam 0.5mg   Contract: 03/19/23 UDS: 03/19/23 Last Visit: 05/30/23 Next Visit: None Last Refill: 07/12/23 #60 and 0RF   Please Advise

## 2023-08-10 NOTE — Telephone Encounter (Signed)
 Rx refill sent to pharmacy.

## 2023-08-14 ENCOUNTER — Other Ambulatory Visit: Payer: Self-pay | Admitting: Medical

## 2023-08-15 ENCOUNTER — Other Ambulatory Visit: Payer: Self-pay

## 2023-08-23 ENCOUNTER — Ambulatory Visit

## 2023-08-23 VITALS — BP 122/78 | HR 60 | Ht 66.0 in | Wt 198.2 lb

## 2023-08-23 DIAGNOSIS — E782 Mixed hyperlipidemia: Secondary | ICD-10-CM | POA: Diagnosis not present

## 2023-08-23 DIAGNOSIS — Z5181 Encounter for therapeutic drug level monitoring: Secondary | ICD-10-CM

## 2023-08-23 DIAGNOSIS — Z79899 Other long term (current) drug therapy: Secondary | ICD-10-CM

## 2023-08-23 DIAGNOSIS — I10 Essential (primary) hypertension: Secondary | ICD-10-CM | POA: Insufficient documentation

## 2023-08-23 DIAGNOSIS — I484 Atypical atrial flutter: Secondary | ICD-10-CM | POA: Diagnosis not present

## 2023-08-23 DIAGNOSIS — I2510 Atherosclerotic heart disease of native coronary artery without angina pectoris: Secondary | ICD-10-CM | POA: Diagnosis not present

## 2023-08-23 HISTORY — DX: Essential (primary) hypertension: I10

## 2023-08-23 NOTE — Assessment & Plan Note (Signed)
 Will schedule for treadmill exercise stress test for drug monitoring.

## 2023-08-23 NOTE — Assessment & Plan Note (Signed)
 Well-controlled blood pressure. Continue metoprolol  25 mg twice daily.

## 2023-08-23 NOTE — Progress Notes (Signed)
 Cardiology Consultation:    Date:  08/23/2023   ID:  Patrick Morris, DOB 05-14-1950, MRN [id number]  PCP:  Sylvia Everts, PA-C  Cardiologist:  Daymon Evans Zsazsa Bahena, MD   Referring MD: Francine Iron   No chief complaint on file.    ASSESSMENT AND PLAN:   Mr Metts 73 year old male with history of atypical atrial flutter on rhythm control with flecainide  [started in October 2024] and metoprolol  and anticoagulation with Eliquis , COPD, coronary atherosclerosis with cardiac CT coronary angiogram at [facility] from September 2024 noting calcium  score 181, CAD RADS-N study nondiagnostic due to motion artifact send underwent functional testing with Lexiscan  stress test October 2024 that showed low risk study and no ischemia on recent, GERD, BPH, history of GI bleed, LVEF 50 to 55% and mild mitral regurgitation on echocardiogram October 2024. Here for the follow-up visit  Problem List Items Addressed This Visit     Coronary artery calcification   Coronary artery calcification noted on prior CT chest imaging. Cardiac coronary CT imaging September 2024 at [facility] nondiagnostic due to motion artifacts, showed elevated calcium  score 181. Functional testing with Lexiscan  stress test nuclear imaging October 2024 showed no ischemia.  Low risk study.  Good functional status. On anticoagulation for atrial flutter as above.  No further indication for aspirin at this time. Continue with rosuvastatin  10 mg once daily.       Encounter for monitoring flecainide  therapy   Will schedule for treadmill exercise stress test for drug monitoring.      Atypical atrial flutter (HCC)   Remains in sinus rhythm. CHA2DS2-VASc score 2.  Continue anticoagulation with Eliquis  5 mg twice daily.  Tolerating well.  No complications.  On rhythm control with flecainide  50 mg twice daily since October 2024.  Also on metoprolol  tartrate 25 mg twice daily.  Will proceed with  modified Bruce protocol treadmill stress test to assess for any significant flecainide  drug related changes for monitoring.  He is agreeable.       Relevant Orders   Exercise Tolerance Test   Mixed dyslipidemia - Primary   Last lipid panel from 01-02-2023 total cholesterol 109, HDL 27, LDL 64, triglycerides 90. Well-controlled. Continue rosuvastatin  10 mg once daily.       Relevant Orders   EKG 12-Lead (Completed)   Hypertension   Well-controlled blood pressure. Continue metoprolol  25 mg twice daily.       Urinary clinic tentatively in 6 months.   History of Present Illness:    Patrick Morris is a 73 y.o. male who is being seen today for a follow-up visit. PCP is Saguier, Gaylin Ke, PA-C. Last visit in our office was 1- 10-2023 with [name], NP-C. Here for the visit today accompanied by his wife.  History of atypical atrial flutter on rhythm control with flecainide  [started in October 2024] and metoprolol  and anticoagulation with Eliquis , COPD, coronary atherosclerosis with cardiac CT coronary angiogram at [facility] from September 2024 noting calcium  score 181, CAD RADS-N study nondiagnostic due to motion artifact send underwent functional testing with Lexiscan  stress test October 2024 that showed low risk study and no ischemia on recent, GERD, BPH, history of GI bleed, LVEF 50 to 55% and mild mitral regurgitation on echocardiogram October 2024  Mentions overall doing well at home.  His wife helps manage his medications and is being compliant with all of them.  No significant symptoms with any of the medications.  Keeps himself active at  home with work in and outside the house.  Mows his lawn regularly.  Denies any significant shortness of breath.  At times describes sharp discomfort in the chest during activities which subsides quickly.  No sustained chest pain or shortness of breath symptoms.  Denies any significant palpitations similar to what he experienced  while in flutter.  does have an apple smart watch now, I cannot tell if it has EGM capabilities.  EKG in clinic today shows sinus rhythm heart rate 60/min, PR interval prolonged at 240 ms, QRS duration 118 ms, QTc 464 ms.  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   Cataract    Cellulitis of foot 01/04/2021   Chronic anticoagulation    Colitis    COPD (chronic obstructive pulmonary disease) (HCC)    COPD exacerbation (HCC) 01/22/2014   Coronary artery calcification    Cough 01/22/2014   Dyspnea on exertion 01/11/2023   Ear infection    GERD (gastroesophageal reflux disease)    Hard of hearing    Hypertension 08/23/2023   Laceration of left hand without foreign body 01/04/2021   Mixed dyslipidemia 01/11/2023   Nodule of middle lobe of right lung 07/29/2020   Added automatically from request for surgery [id number]     Pain in left foot 09/20/2016   Pain in left leg 09/20/2016   Pneumothorax 08/10/2020   Pneumothorax, right 08/10/2020   Salmonella    Sinus infection    Skin lesion 01/22/2014   Wellness examination 02/24/2014    Past Surgical History:  Procedure Laterality Date   BRONCHIAL BIOPSY  08/10/2020   Procedure: BRONCHIAL BIOPSIES;  Surgeon: Prudy Brownie, DO;  Location: [facility];  Service: Pulmonary;;   BRONCHIAL BRUSHINGS  08/10/2020   Procedure: BRONCHIAL BRUSHINGS;  Surgeon: Prudy Brownie, DO;  Location: [facility];  Service: Pulmonary;;   BRONCHIAL NEEDLE ASPIRATION BIOPSY  08/10/2020   Procedure: BRONCHIAL NEEDLE ASPIRATION BIOPSIES;  Surgeon: Prudy Brownie, DO;  Location: [facility];  Service: Pulmonary;;   BRONCHIAL WASHINGS   08/10/2020   Procedure: BRONCHIAL WASHINGS;  Surgeon: Prudy Brownie, DO;  Location: [facility];  Service: Pulmonary;;   COLONOSCOPY     ENDOBRONCHIAL ULTRASOUND  08/10/2020   Procedure: ENDOBRONCHIAL ULTRASOUND;  Surgeon: Prudy Brownie, DO;  Location: [facility];  Service: Pulmonary;;   EYE SURGERY Right    FIDUCIAL MARKER PLACEMENT  08/10/2020   Procedure: FIDUCIAL MARKER PLACEMENT;  Surgeon: Prudy Brownie, DO;  Location: [facility];  Service: Pulmonary;;   IR RADIOLOGIST EVAL & MGMT  05/07/2019   IR RADIOLOGIST EVAL & MGMT  05/27/2019   IR RADIOLOGIST EVAL & MGMT  10/23/2019   IR RADIOLOGIST EVAL & MGMT  01/15/2020   VIDEO BRONCHOSCOPY WITH ENDOBRONCHIAL NAVIGATION Right 08/10/2020   Procedure: VIDEO BRONCHOSCOPY WITH ENDOBRONCHIAL NAVIGATION;  Surgeon: Prudy Brownie, DO;  Location: [facility];  Service: Pulmonary;  Laterality: Right;    Current Medications: Current Meds  Medication Sig   acetaminophen  (TYLENOL ) 500 MG tablet Take 1,000 mg by mouth every 6 (six) hours as needed for mild pain or moderate pain. For pain   albuterol  (VENTOLIN  HFA) 108 (90 Base) MCG/ACT inhaler Inhale 2 puffs into the lungs every 6 (  six) hours as needed for wheezing or shortness of breath.   alendronate  (FOSAMAX ) 35 MG tablet Take 1 tablet (35 mg total) by mouth every 7 (seven) days. Take with a full glass of water on an empty stomach.   apixaban  (ELIQUIS ) 5 MG TABS tablet Take 5 mg by mouth 2 (two) times daily.   clonazePAM  (KLONOPIN ) 0.5 MG tablet Take 1 tablet by mouth twice daily as needed for anxiety   flecainide  (TAMBOCOR ) 50 MG tablet Take 1 tablet (50 mg total) by mouth 2 (two) times daily.   fluticasone  (FLONASE ) 50 MCG/ACT nasal spray Use 2 spray(s) in each nostril once daily   gabapentin  (NEURONTIN ) 100 MG capsule TAKE 1 CAPSULE BY MOUTH THREE TIMES DAILY   metoprolol  tartrate (LOPRESSOR ) 25 MG tablet Take 1 tablet (25 mg total) by mouth 2 (two) times daily.   nitroGLYCERIN  (NITROSTAT )  0.4 MG SL tablet Place 0.4 mg under the tongue every 5 (five) minutes as needed for chest pain.   rosuvastatin  (CRESTOR ) 10 MG tablet Take 1 tablet (10 mg total) by mouth daily.   TRELEGY ELLIPTA 100-62.5-25 MCG/ACT AEPB Inhale 1 puff into the lungs daily.     Allergies:   Aspirin   Social History   Socioeconomic History   Marital status: Married    Spouse name: Not on file   Number of children: Not on file   Years of education: Not on file   Highest education level: Not on file  Occupational History   Not on file  Tobacco Use   Smoking status: Former    Current packs/day: 1.00    Average packs/day: 1 pack/day for 55.0 years (55.0 ttl pk-yrs)    Types: Cigarettes   Smokeless tobacco: Never   Tobacco comments:    down to 0.5ppd  Vaping Use   Vaping status: Never Used  Substance and Sexual Activity   Alcohol use: Never   Drug use: No   Sexual activity: Not Currently    Birth control/protection: None  Other Topics Concern   Not on file  Social History Narrative   Not on file   Social Drivers of Health   Financial Resource Strain: Low Risk  (04/11/2021)   Overall Financial Resource Strain (CARDIA)    Difficulty of Paying Living Expenses: Not hard at all  Food Insecurity: Low Risk  (06/25/2023)   Received from [facility]   Hunger Vital Sign    Worried About Running Out of Food in the Last Year: Never true    Ran Out of Food in the Last Year: Never true  Transportation Needs: No Transportation Needs (06/25/2023)   Received from [facility]    In the past 12 months, has lack of reliable transportation kept you from medical appointments, meetings, work or from getting things needed for daily living? : No  Physical Activity: Inactive (04/11/2021)   Exercise Vital Sign    Days of Exercise per Week: 0 days    Minutes of Exercise per Session: 0 min  Stress: No Stress Concern Present (02/20/2022)   Received from [facility], [facility] - Occupational Stress Questionnaire    Feeling of Stress : Not at all  Social Connections: Unknown (09/02/2021)   Received from [facility], [facility]   Social Network    Social Network: Not on file     Family History: The patient's family history includes Alcohol abuse in his father; Heart attack in his brother;  Hyperlipidemia in his father; Lymphoma in his brother; Stroke in his father; Throat cancer in his brother. ROS:   Please see the history of present illness.    All 14 point review of systems negative except as described per history of present illness.  EKGs/Labs/Other Studies Reviewed:    The following studies were reviewed today:   EKG:  EKG Interpretation Date/Time:  Thursday August 23 2023 08:05:04 EDT Ventricular Rate:  60 PR Interval:  240 QRS Duration:  118 QT Interval:  464 QTC Calculation: 464 R Axis:   0  Text Interpretation: Sinus rhythm with 1st degree A-V block Left ventricular hypertrophy with QRS widening Abnormal ECG When compared with ECG of 28-Feb-2023 09:59, Vent. rate has decreased BY  29 BPM Confirmed by Bertha Broad reddy [phone]) on 08/23/2023 8:11:03 AM    Recent Labs: 04/12/2023: ALT 13; BUN 9; Creatinine, Ser 0.72; Hemoglobin 14.5; Platelets 293.0; Potassium 4.2; Sodium 143  Recent Lipid Panel    Component Value Date/Time   CHOL 109 01/02/2023 1115   TRIG 90 01/02/2023 1115   HDL 27 (L) 01/02/2023 1115   CHOLHDL 4.0 01/02/2023 1115   CHOLHDL 4 09/11/2017 0843   VLDL 16.2 09/11/2017 0843   LDLCALC 64 01/02/2023 1115    Physical Exam:    VS:  BP 122/78   Pulse 60   Ht 5\' 6"  (1.676 m)   Wt 198 lb 3.2 oz (89.9 kg)   SpO2 96%   BMI 31.99 kg/m     Wt Readings from Last 3 Encounters:  08/23/23 198 lb 3.2 oz (89.9 kg)  06/26/23 198 lb 6.4 oz (90 kg)  05/30/23 194 lb (88 kg)     GENERAL:  Well nourished, well developed in no acute distress NECK: No JVD; No carotid bruits CARDIAC: RRR, S1  and S2 present, no murmurs, no rubs, no gallops CHEST:  Clear to auscultation without rales, wheezing or rhonchi  Extremities: No pitting pedal edema. Pulses bilaterally symmetric with radial 2+ and dorsalis pedis 2+ NEUROLOGIC:  Alert and oriented x 3  Medication Adjustments/Labs and Tests Ordered: Current medicines are reviewed at length with the patient today.  Concerns regarding medicines are outlined above.  Orders Placed This Encounter  Procedures   Exercise Tolerance Test   EKG 12-Lead   No orders of the defined types were placed in this encounter.   Signed, Lura Sallies, MD, MPH, [facility]. 08/23/2023 8:32 AM    [facility]

## 2023-08-23 NOTE — Assessment & Plan Note (Signed)
 Coronary artery calcification noted on prior CT chest imaging. Cardiac coronary CT imaging September 2024 at [facility] nondiagnostic due to motion artifacts, showed elevated calcium  score 181. Functional testing with Lexiscan  stress test nuclear imaging October 2024 showed no ischemia.  Low risk study.  Good functional status. On anticoagulation for atrial flutter as above.  No further indication for aspirin at this time. Continue with rosuvastatin  10 mg once daily.

## 2023-08-23 NOTE — Assessment & Plan Note (Signed)
 Last lipid panel from 01-02-2023 total cholesterol 109, HDL 27, LDL 64, triglycerides 90. Well-controlled. Continue rosuvastatin  10 mg once daily.

## 2023-08-23 NOTE — Patient Instructions (Signed)
 Medication Instructions:  Your physician recommends that you continue on your current medications as directed. Please refer to the Current Medication list given to you today.  *If you need a refill on your cardiac medications before your next appointment, please call your pharmacy*   Lab Work: None ordered If you have labs (blood work) drawn today and your tests are completely normal, you will receive your results only by: MyChart Message (if you have MyChart) OR A paper copy in the mail If you have any lab test that is abnormal or we need to change your treatment, we will call you to review the results.   Testing/Procedures:   [facility]ovascular [facility]   August 23, 2023      Please arrive 15 minutes prior to your appointment time for registration and insurance purposes.  The test will take approximately 45 minutes to complete.  How to prepare for your Exercise Stress Test: Do bring a list of your current medications with you.  If not listed below, you may take your medications as normal. Do not take metoprolol  (Lopressor , Toprol ) for 24 hours prior to the test.  Bring the medication to your appointment as you may be required to take it once the test is complete. Do wear comfortable clothes (no dresses or overalls) and walking shoes, tennis shoes preferred (no heels or open toed shoes are allowed) Do Not wear cologne, perfume, aftershave or lotions (deodorant is allowed). Please report to [address], [address] for your test.  If these instructions are not followed, your test will have to be rescheduled.  If you have questions or concerns about your appointment, you can call the Stress Lab at [phone].  If you cannot keep your appointment, please provide 24 hours notification to the Stress Lab, to avoid a possible $50 charge to your account.   Follow-Up: At [facility], you and your health needs are our priority.  As part of our continuing  mission to provide you with exceptional heart care, we have created designated Provider Care Teams.  These Care Teams include your primary Cardiologist (physician) and Advanced Practice Providers (APPs -  Physician Assistants and Nurse Practitioners) who all work together to provide you with the care you need, when you need it.  We recommend signing up for the patient portal called "MyChart".  Sign up information is provided on this After Visit Summary.  MyChart is used to connect with patients for Virtual Visits (Telemedicine).  Patients are able to view lab/test results, encounter notes, upcoming appointments, etc.  Non-urgent messages can be sent to your provider as well.   To learn more about what you can do with MyChart, go to [email or web address].    Your next appointment:   6 month(s)  The format for your next appointment:   In Person  Provider:   Bertha Broad, MD    Other Instructions none  Important Information About Sugar

## 2023-08-23 NOTE — Assessment & Plan Note (Signed)
 Remains in sinus rhythm. CHA2DS2-VASc score 2.  Continue anticoagulation with Eliquis  5 mg twice daily.  Tolerating well.  No complications.  On rhythm control with flecainide  50 mg twice daily since October 2024.  Also on metoprolol  tartrate 25 mg twice daily.  Will proceed with modified Bruce protocol treadmill stress test to assess for any significant flecainide  drug related changes for monitoring.  He is agreeable.

## 2023-08-28 ENCOUNTER — Ambulatory Visit: Admitting: Cardiology

## 2023-08-29 DIAGNOSIS — E041 Nontoxic single thyroid nodule: Secondary | ICD-10-CM | POA: Diagnosis not present

## 2023-09-11 ENCOUNTER — Other Ambulatory Visit: Payer: Self-pay | Admitting: Medical

## 2023-09-11 NOTE — Telephone Encounter (Signed)
 Requesting: klonopin   Contract:05/01/23 UDS:05/01/23 Last Visit:05/30/23 Next Visit:n/a Last Refill:08/10/23  Please Advise

## 2023-09-14 ENCOUNTER — Ambulatory Visit: Payer: Self-pay | Admitting: Medical

## 2023-09-14 NOTE — Telephone Encounter (Signed)
 Copied from CRM [id number]. Topic: Clinical - Prescription Issue >> Sep 14, 2023  8:18 AM Kita Perish H wrote: Reason for CRM: Patients wife calling to check status of refill request for his clonazePAM  (KLONOPIN ) 0.5 MG tablet that was request on 5/13, shows discontinued on 5/13 by provider. Patrick Morris states he only has one pill left and it helps him with his anxiety from cancer. Please reach out, thanks.   Patrick Morris  [phone]

## 2023-10-02 ENCOUNTER — Telehealth (HOSPITAL_COMMUNITY): Payer: Self-pay | Admitting: *Deleted

## 2023-10-02 NOTE — Telephone Encounter (Signed)
Patient given instructions for upcoming stress test.  Patrick Morris  

## 2023-10-04 DIAGNOSIS — Z8551 Personal history of malignant neoplasm of bladder: Secondary | ICD-10-CM | POA: Diagnosis not present

## 2023-10-04 DIAGNOSIS — R8289 Other abnormal findings on cytological and histological examination of urine: Secondary | ICD-10-CM | POA: Diagnosis not present

## 2023-10-04 DIAGNOSIS — D49519 Neoplasm of unspecified behavior of unspecified kidney: Secondary | ICD-10-CM | POA: Diagnosis not present

## 2023-10-04 DIAGNOSIS — R339 Retention of urine, unspecified: Secondary | ICD-10-CM | POA: Diagnosis not present

## 2023-10-04 DIAGNOSIS — C672 Malignant neoplasm of lateral wall of bladder: Secondary | ICD-10-CM | POA: Diagnosis not present

## 2023-10-09 ENCOUNTER — Ambulatory Visit

## 2023-10-15 ENCOUNTER — Ambulatory Visit (INDEPENDENT_AMBULATORY_CARE_PROVIDER_SITE_OTHER): Admitting: Medical

## 2023-10-15 VITALS — BP 118/62 | HR 54 | Resp 18 | Ht 66.0 in | Wt 192.0 lb

## 2023-10-15 DIAGNOSIS — M8580 Other specified disorders of bone density and structure, unspecified site: Secondary | ICD-10-CM

## 2023-10-15 DIAGNOSIS — C679 Malignant neoplasm of bladder, unspecified: Secondary | ICD-10-CM | POA: Diagnosis not present

## 2023-10-15 DIAGNOSIS — C649 Malignant neoplasm of unspecified kidney, except renal pelvis: Secondary | ICD-10-CM

## 2023-10-15 DIAGNOSIS — C342 Malignant neoplasm of middle lobe, bronchus or lung: Secondary | ICD-10-CM | POA: Diagnosis not present

## 2023-10-15 DIAGNOSIS — R7989 Other specified abnormal findings of blood chemistry: Secondary | ICD-10-CM

## 2023-10-15 DIAGNOSIS — E785 Hyperlipidemia, unspecified: Secondary | ICD-10-CM

## 2023-10-15 DIAGNOSIS — I2510 Atherosclerotic heart disease of native coronary artery without angina pectoris: Secondary | ICD-10-CM

## 2023-10-15 DIAGNOSIS — J441 Chronic obstructive pulmonary disease with (acute) exacerbation: Secondary | ICD-10-CM

## 2023-10-15 NOTE — Patient Instructions (Signed)
 Chronic Obstructive Pulmonary Disease (COPD) COPD well-managed with Trelegy and albuterol . - Continue Trelegy once daily. - Continue albuterol  as a rescue inhaler. - Follow up with pulmonologist.  Bladder Neoplasm Bladder neoplasm with planned urologist follow-up. Agreed to cystoscopy in August. - Follow up with urologist in August for cystoscopy.  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 Open to referral for further evaluation. - Refer to oncologist for evaluation.  Coronary Artery Disease Coronary artery disease with hyperlipidemia. - Order lipid panel and cmp. -continue current med for cholesterol  Atrial Flutter Atrial fibrillation and atrial flutter managed with Eliquis  and flecainide . - Continue Eliquis . - Continue flecainide .  Osteopenia Mild decreased bone density. Fosamax  ongoing, vitamin D supplementation under consideration. - Order vitamin D level. - Continue Fosamax . - Consider vitamin D supplementation based on lab results.  Follow up date to be determined after lab review

## 2023-10-15 NOTE — Progress Notes (Signed)
 Subjective:    Patient ID: Patrick Morris, male    DOB: [birth year], 73 y.o.   MRN: [id number]  HPI Patrick Morris is a 73 year old male with COPD and a history of lung neoplasm who presents for follow-up on his pulmonary. Also reviewed prior other  oncological conditions(bladder and renal cancer.  He has COPD and is currently using Trelegy once daily and albuterol  as a rescue inhaler, which have significantly improved his breathing. He last saw his pulmonologist in February and typically has annual follow-ups unless issues arise.  He has a history of a neoplasm in the right middle lungs. He is not currently seeing an oncologist for this condition.  He has a thyroid  nodule that was evaluated by an ENT specialist and is currently not considered problematic.  He has a history of bladder neoplasm and renal cell carcinoma. He is under the care of a urologist and is scheduled to follow up in August for a cystoscopy. He has not seen an oncologist in over two years.  He has a history of coronary artery disease, high cholesterol, and atrial fibrillation/flutter.Aaron Aas He is on anticoagulation therapy with Eliquis  and takes flecainide . He also has a history of atrial flutter.  He has osteopenia and is taking Fosamax  weekly.   Review of Systems  Constitutional:  Negative for chills, fatigue and fever.  HENT:  Negative for congestion.   Respiratory:  Negative for cough, chest tightness and wheezing.        Occasoinal cough but none presently.  Cardiovascular:  Negative for chest pain and palpitations.  Gastrointestinal:  Negative for abdominal pain.  Genitourinary:  Negative for dysuria.  Musculoskeletal:  Negative for back pain, myalgias and neck pain.  Skin:  Negative for rash.  Neurological:  Negative for dizziness, seizures, weakness and headaches.  Hematological:  Negative for adenopathy. Does not bruise/bleed easily.  Psychiatric/Behavioral:  Negative for behavioral problems and dysphoric mood.     Past Medical History:  Diagnosis Date   Atypical atrial flutter [facility])    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   Cataract    Cellulitis of foot 01/04/2021   Chronic anticoagulation    Colitis    COPD (chronic obstructive pulmonary disease) (HCC)    COPD exacerbation (HCC) 01/22/2014   Coronary artery calcification    Cough 01/22/2014   Dyspnea on exertion 01/11/2023   Ear infection    GERD (gastroesophageal reflux disease)    Hard of hearing    Hypertension 08/23/2023   Laceration of left hand without foreign body 01/04/2021   Mixed dyslipidemia 01/11/2023   Nodule of middle lobe of right lung 07/29/2020   Added automatically from request for surgery [id number]     Pain in left foot 09/20/2016   Pain in left leg 09/20/2016   Pneumothorax 08/10/2020   Pneumothorax, right 08/10/2020   Salmonella    Sinus infection    Skin lesion 01/22/2014   Wellness examination 02/24/2014     Social History   Socioeconomic History   Marital status: Married    Spouse name: Not on file   Number of children: Not on file   Years of education: Not on file   Highest education level: Not on file  Occupational History   Not on file  Tobacco Use   Smoking status: Former    Current packs/day: 1.00    Average packs/day: 1 pack/day for 55.0  years (55.0 ttl pk-yrs)    Types: Cigarettes   Smokeless tobacco: Never   Tobacco comments:    down to 0.5ppd  Vaping Use   Vaping status: Never Used  Substance and Sexual Activity   Alcohol use: Never   Drug use: No   Sexual activity: Not Currently    Birth control/protection: None  Other Topics Concern   Not on file  Social History Narrative   Not on file   Social Drivers of Health   Financial Resource Strain: Low Risk  (04/11/2021)   Overall Financial Resource Strain (CARDIA)    Difficulty of Paying Living Expenses: Not hard at all  Food  Insecurity: Low Risk  (06/25/2023)   Received from [facility]   Hunger Vital Sign    Within the past 12 months, you worried that your food would run out before you got money to buy more: Never true    Within the past 12 months, the food you bought just didn't last and you didn't have money to get more. : Never true  Transportation Needs: No Transportation Needs (06/25/2023)   Received from [facility]   Transportation    In the past 12 months, has lack of reliable transportation kept you from medical appointments, meetings, work or from getting things needed for daily living? : No  Physical Activity: Inactive (04/11/2021)   Exercise Vital Sign    Days of Exercise per Week: 0 days    Minutes of Exercise per Session: 0 min  Stress: No Stress Concern Present (02/20/2022)   Received from [facility] Occupational Health - Occupational Stress Questionnaire    Feeling of Stress : Not at all  Social Connections: Unknown (09/02/2021)   Received from [facility]   Social Network    Social Network: Not on file  Intimate Partner Violence: Unknown (04/04/2023)   Received from [facility]   HITS    Over the last 12 months how often did your partner physically hurt you?: Not on file    Over the last 12 months how often did your partner insult you or talk down to you?: Never    Over the last 12 months how often did your partner threaten you with physical harm?: Never    Over the last 12 months how often did your partner scream or curse at you?: Never    Past Surgical History:  Procedure Laterality Date   BRONCHIAL BIOPSY  08/10/2020   Procedure: BRONCHIAL BIOPSIES;  Surgeon: Prudy Brownie, DO;  Location: [facility];  Service: Pulmonary;;   BRONCHIAL BRUSHINGS  08/10/2020   Procedure: BRONCHIAL BRUSHINGS;  Surgeon: Prudy Brownie, DO;  Location: [facility];  Service: Pulmonary;;   BRONCHIAL NEEDLE ASPIRATION BIOPSY  08/10/2020   Procedure: BRONCHIAL NEEDLE  ASPIRATION BIOPSIES;  Surgeon: Prudy Brownie, DO;  Location: [facility];  Service: Pulmonary;;   BRONCHIAL WASHINGS  08/10/2020   Procedure: BRONCHIAL WASHINGS;  Surgeon: Prudy Brownie, DO;  Location: [facility];  Service: Pulmonary;;   COLONOSCOPY     ENDOBRONCHIAL ULTRASOUND  08/10/2020   Procedure: ENDOBRONCHIAL ULTRASOUND;  Surgeon: Prudy Brownie, DO;  Location: [facility];  Service: Pulmonary;;   EYE SURGERY Right    FIDUCIAL MARKER PLACEMENT  08/10/2020   Procedure: FIDUCIAL MARKER PLACEMENT;  Surgeon: Prudy Brownie, DO;  [facility]: [facility];  Service: Pulmonary;;   IR RADIOLOGIST EVAL & MGMT  05/07/2019   IR RADIOLOGIST EVAL & MGMT  05/27/2019  IR RADIOLOGIST EVAL & MGMT  10/23/2019   IR RADIOLOGIST EVAL & MGMT  01/15/2020   VIDEO BRONCHOSCOPY WITH ENDOBRONCHIAL NAVIGATION Right 08/10/2020   Procedure: VIDEO BRONCHOSCOPY WITH ENDOBRONCHIAL NAVIGATION;  Surgeon: Prudy Brownie, DO;  Location: [facility];  Service: Pulmonary;  Laterality: Right;    Family History  Problem Relation Age of Onset   Hyperlipidemia Father    Stroke Father    Alcohol abuse Father    Throat cancer Brother    Lymphoma Brother    Heart attack Brother     Allergies  Allergen Reactions   Aspirin Other (See Comments)    Acid reflux and drooling    Current Outpatient Medications on File Prior to Visit  Medication Sig Dispense Refill   acetaminophen  (TYLENOL ) 500 MG tablet Take 1,000 mg by mouth every 6 (six) hours as needed for mild pain or moderate pain. For pain     albuterol  (VENTOLIN  HFA) 108 (90 Base) MCG/ACT inhaler Inhale 2 puffs into the lungs every 6 (six) hours as needed for wheezing or shortness of breath.     alendronate  (FOSAMAX ) 35 MG tablet Take 1 tablet (35 mg total) by mouth every 7 (seven) days. Take with a full glass of water on an empty stomach. 4 tablet 11   apixaban  (ELIQUIS ) 5 MG TABS tablet Take 5 mg by mouth 2 (two) times daily.     clonazePAM  (KLONOPIN ) 0.5 MG  tablet Take 1 tablet by mouth twice daily as needed for anxiety 60 tablet 0   flecainide  (TAMBOCOR ) 50 MG tablet Take 1 tablet (50 mg total) by mouth 2 (two) times daily. 180 tablet 3   fluticasone  (FLONASE ) 50 MCG/ACT nasal spray Use 2 spray(s) in each nostril once daily 16 g 0   gabapentin  (NEURONTIN ) 100 MG capsule TAKE 1 CAPSULE BY MOUTH THREE TIMES DAILY 90 capsule 0   metoprolol  tartrate (LOPRESSOR ) 25 MG tablet Take 1 tablet (25 mg total) by mouth 2 (two) times daily. 180 tablet 3   nitroGLYCERIN  (NITROSTAT ) 0.4 MG SL tablet Place 0.4 mg under the tongue every 5 (five) minutes as needed for chest pain.     rosuvastatin  (CRESTOR ) 10 MG tablet Take 1 tablet (10 mg total) by mouth daily. 90 tablet 2   TRELEGY ELLIPTA 100-62.5-25 MCG/ACT AEPB Inhale 1 puff into the lungs daily.     No current facility-administered medications on file prior to visit.    BP 118/62   Pulse (!) 54   Resp 18   Ht 5' 6 (1.676 m)   Wt 192 lb (87.1 kg)   SpO2 96%   BMI 30.99 kg/m        Objective:   Physical Exam  General Mental Status- Alert. General Appearance- Not in acute distress.   Skin General: Color- Normal Color. Moisture- Normal Moisture.  Neck . No JVD.  Chest and Lung Exam Auscultation: Breath Sounds:-CTA  Cardiovascular Auscultation:Rythm- RRRR Murmurs & Other Heart Sounds:Auscultation of the heart reveals- No Murmurs.  Abdomen Inspection:-Inspeection Normal. Palpation/Percussion:Note:No mass. Palpation and Percussion of the abdomen reveal- Non Tender, Non Distended + BS, no rebound or guarding.    Neurologic Cranial Nerve exam:- CN III-XII intact(No nystagmus), symmetric smile. Drift Test:- No drift. Romberg Exam:- Negative.  Heal to Toe Gait exam:-Normal. Finger to Nose:- Normal/Intact Strength:- 5/5 equal and symmetric strength both upper and lower extremities.       Assessment & Plan:   Patient Instructions  Chronic Obstructive Pulmonary Disease (COPD) COPD  well-managed with Trelegy  and albuterol . - Continue Trelegy once daily. - Continue albuterol  as a rescue inhaler. - Follow up with pulmonologist.  Bladder Neoplasm Bladder neoplasm with planned urologist follow-up. Agreed to cystoscopy in August. - Follow up with urologist in August for cystoscopy.  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 Open to referral for further evaluation. - Refer to oncologist for evaluation.  Coronary Artery Disease Coronary artery disease with hyperlipidemia. - Order lipid panel and cmp. -continue current med for cholesterol  Atrial Flutter Atrial fibrillation and atrial flutter managed with Eliquis  and flecainide . - Continue Eliquis . - Continue flecainide .  Osteopenia Mild decreased bone density. Fosamax  ongoing, vitamin D supplementation under consideration. - Order vitamin D level. - Continue Fosamax . - Consider vitamin D supplementation based on lab results.  Follow up date to be determined after lab review   Sylvia Everts, PA-C

## 2023-10-16 ENCOUNTER — Other Ambulatory Visit: Payer: Self-pay

## 2023-10-16 ENCOUNTER — Encounter: Payer: Self-pay | Admitting: Hematology & Oncology

## 2023-10-16 ENCOUNTER — Inpatient Hospital Stay (HOSPITAL_BASED_OUTPATIENT_CLINIC_OR_DEPARTMENT_OTHER): Admitting: Hematology & Oncology

## 2023-10-16 ENCOUNTER — Inpatient Hospital Stay: Attending: Hematology & Oncology

## 2023-10-16 ENCOUNTER — Other Ambulatory Visit: Payer: Self-pay | Admitting: Family

## 2023-10-16 ENCOUNTER — Ambulatory Visit: Payer: Self-pay | Admitting: Medical

## 2023-10-16 VITALS — BP 123/66 | HR 52 | Temp 97.6°F | Resp 18 | Ht 66.0 in | Wt 194.0 lb

## 2023-10-16 DIAGNOSIS — N400 Enlarged prostate without lower urinary tract symptoms: Secondary | ICD-10-CM

## 2023-10-16 DIAGNOSIS — C679 Malignant neoplasm of bladder, unspecified: Secondary | ICD-10-CM | POA: Insufficient documentation

## 2023-10-16 DIAGNOSIS — N2889 Other specified disorders of kidney and ureter: Secondary | ICD-10-CM | POA: Insufficient documentation

## 2023-10-16 DIAGNOSIS — Z85118 Personal history of other malignant neoplasm of bronchus and lung: Secondary | ICD-10-CM | POA: Diagnosis not present

## 2023-10-16 DIAGNOSIS — C342 Malignant neoplasm of middle lobe, bronchus or lung: Secondary | ICD-10-CM | POA: Insufficient documentation

## 2023-10-16 DIAGNOSIS — Z807 Family history of other malignant neoplasms of lymphoid, hematopoietic and related tissues: Secondary | ICD-10-CM

## 2023-10-16 DIAGNOSIS — D414 Neoplasm of uncertain behavior of bladder: Secondary | ICD-10-CM

## 2023-10-16 DIAGNOSIS — Z800 Family history of malignant neoplasm of digestive organs: Secondary | ICD-10-CM | POA: Insufficient documentation

## 2023-10-16 DIAGNOSIS — Z87891 Personal history of nicotine dependence: Secondary | ICD-10-CM | POA: Insufficient documentation

## 2023-10-16 LAB — CMP (CANCER CENTER ONLY)
ALT: 9 U/L (ref 0–44)
AST: 11 U/L — ABNORMAL LOW (ref 15–41)
Albumin: 4.1 g/dL (ref 3.5–5.0)
Alkaline Phosphatase: 89 U/L (ref 38–126)
Anion gap: 6 (ref 5–15)
BUN: 14 mg/dL (ref 8–23)
CO2: 27 mmol/L (ref 22–32)
Calcium: 8.9 mg/dL (ref 8.9–10.3)
Chloride: 106 mmol/L (ref 98–111)
Creatinine: 0.72 mg/dL (ref 0.61–1.24)
GFR, Estimated: >60 mL/min (ref >60)
Glucose, Bld: 89 mg/dL (ref 70–99)
Potassium: 3.9 mmol/L (ref 3.5–5.1)
Sodium: 139 mmol/L (ref 135–145)
Total Bilirubin: 0.6 mg/dL (ref 0.0–1.2)
Total Protein: 6.6 g/dL (ref 6.5–8.1)

## 2023-10-16 LAB — CBC WITH DIFFERENTIAL (CANCER CENTER ONLY)
Abs Immature Granulocytes: 0.02 10*3/uL (ref 0.00–0.07)
Basophils Absolute: 0 10*3/uL (ref 0.0–0.1)
Basophils Relative: 1 %
Eosinophils Absolute: 0.8 10*3/uL — ABNORMAL HIGH (ref 0.0–0.5)
Eosinophils Relative: 13 %
HCT: 39 % (ref 39.0–52.0)
Hemoglobin: 13.1 g/dL (ref 13.0–17.0)
Immature Granulocytes: 0 %
Lymphocytes Relative: 27 %
Lymphs Abs: 1.6 10*3/uL (ref 0.7–4.0)
MCH: 29.3 pg (ref 26.0–34.0)
MCHC: 33.6 g/dL (ref 30.0–36.0)
MCV: 87.2 fL (ref 80.0–100.0)
Monocytes Absolute: 0.5 10*3/uL (ref 0.1–1.0)
Monocytes Relative: 7 %
Neutro Abs: 3.2 10*3/uL (ref 1.7–7.7)
Neutrophils Relative %: 52 %
Platelet Count: 230 10*3/uL (ref 150–400)
RBC: 4.47 MIL/uL (ref 4.22–5.81)
RDW: 13 % (ref 11.5–15.5)
WBC Count: 6.1 10*3/uL (ref 4.0–10.5)
nRBC: 0 % (ref 0.0–0.2)

## 2023-10-16 LAB — CBC WITH DIFFERENTIAL/PLATELET
Absolute Lymphocytes: 1781 {cells}/uL (ref 850–3900)
Absolute Monocytes: 533 {cells}/uL (ref 200–950)
Basophils Absolute: 73 {cells}/uL (ref 0–200)
Basophils Relative: 1 %
Eosinophils Absolute: 708 {cells}/uL — ABNORMAL HIGH (ref 15–500)
Eosinophils Relative: 9.7 %
HCT: 43.7 % (ref 38.5–50.0)
Hemoglobin: 14.4 g/dL (ref 13.2–17.1)
MCH: 29.9 pg (ref 27.0–33.0)
MCHC: 33 g/dL (ref 32.0–36.0)
MCV: 90.7 fL (ref 80.0–100.0)
MPV: 9.1 fL (ref 7.5–12.5)
Monocytes Relative: 7.3 %
Neutro Abs: 4205 {cells}/uL (ref 1500–7800)
Neutrophils Relative %: 57.6 %
Platelets: 254 10*3/uL (ref 140–400)
RBC: 4.82 10*6/uL (ref 4.20–5.80)
RDW: 12.7 % (ref 11.0–15.0)
Total Lymphocyte: 24.4 %
WBC: 7.3 10*3/uL (ref 3.8–10.8)

## 2023-10-16 LAB — IRON AND IRON BINDING CAPACITY (CC-WL,HP ONLY)
Iron: 90 ug/dL (ref 45–182)
Saturation Ratios: 34 % (ref 17.9–39.5)
TIBC: 263 ug/dL (ref 250–450)
UIBC: 173 ug/dL (ref 117–376)

## 2023-10-16 LAB — COMPREHENSIVE METABOLIC PANEL WITH GFR
AG Ratio: 1.6 (calc) (ref 1.0–2.5)
ALT: 11 U/L (ref 9–46)
AST: 13 U/L (ref 10–35)
Albumin: 4.1 g/dL (ref 3.6–5.1)
Alkaline phosphatase (APISO): 97 U/L (ref 35–144)
BUN/Creatinine Ratio: 19 (calc) (ref 6–22)
BUN: 13 mg/dL (ref 7–25)
CO2: 23 mmol/L (ref 20–32)
Calcium: 8.8 mg/dL (ref 8.6–10.3)
Chloride: 107 mmol/L (ref 98–110)
Creat: 0.68 mg/dL — ABNORMAL LOW (ref 0.70–1.28)
Globulin: 2.6 g/dL (ref 1.9–3.7)
Glucose, Bld: 87 mg/dL (ref 65–99)
Potassium: 4.2 mmol/L (ref 3.5–5.3)
Sodium: 139 mmol/L (ref 135–146)
Total Bilirubin: 0.9 mg/dL (ref 0.2–1.2)
Total Protein: 6.7 g/dL (ref 6.1–8.1)
eGFR: 98 mL/min/{1.73_m2} (ref >=60)

## 2023-10-16 LAB — VITAMIN D 25 HYDROXY (VIT D DEFICIENCY, FRACTURES): Vit D, 25-Hydroxy: 38 ng/mL (ref 30–100)

## 2023-10-16 LAB — LIPID PANEL
Cholesterol: 101 mg/dL (ref <200)
HDL: 30 mg/dL — ABNORMAL LOW (ref >=40)
LDL Cholesterol (Calc): 57 mg/dL
Non-HDL Cholesterol (Calc): 71 mg/dL (ref <130)
Total CHOL/HDL Ratio: 3.4 (calc) (ref <5.0)
Triglycerides: 61 mg/dL (ref <150)

## 2023-10-16 LAB — LACTATE DEHYDROGENASE: LDH: 123 U/L (ref 98–192)

## 2023-10-16 LAB — FERRITIN: Ferritin: 200 ng/mL (ref 24–336)

## 2023-10-16 NOTE — Progress Notes (Signed)
 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  HPI: Mr. Ungaro is a very nice 73 year old white male.  He comes in with his wife.  He has a history of extensive tobacco use.  He probably has about an 80-pack-year history of tobacco use.  He stopped a couple years ago.  He was found to have a localized bronchogenic carcinoma of the right lung.  This back in 2023.  He underwent SBRT.  So far, this has not been a problem for him.  He has been seen by Urology.  He has had a history of benign prostatic hypertrophy.  I think this was taken care of.  He however was found to have a superficial bladder cancer.  He had this resected out.  I think this had come back.  I think he did have a dose of treatment for this.  He sees his Urologist in a month or so to have another cystoscopy.  He also was found to have a tumor in the right kidney.  This measures 2.8 x 2.6 cm.  He has not wanted to have this removed.  I think he may have had potential cryotherapy or radioablation for this.  Again, he is does not have a medical oncologist.  He was kindly referred to the [facility] for follow-up.  He looks pretty good.  He is urinating well.  He has had no problems with nausea or vomiting.  He has had no cough or shortness of breath.  He has had no bleeding.  Has been no hematuria.  He has had no leg swelling.  He is retired.  He really never had alcohol use.  I think his last CT scan was done in early April.  At that time, he was found to have the stable tumor in the right kidney that measured 2.8 x 2.6 cm.  Also noted was a little thickening in the left wall of the bladder.  He had no adenopathy.  His lung bases all  looked fine.  There has been no issues with headache.  He has had no visual changes.  He is retired.  Overall, I would say his performance status is probably ECOG 1.    Past Medical History:  Diagnosis Date   Atypical atrial flutter [facility])    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   Cataract    Cellulitis of foot 01/04/2021   Chronic anticoagulation    Colitis    COPD (chronic obstructive pulmonary disease) (HCC)    COPD exacerbation (HCC) 01/22/2014   Coronary artery calcification    Cough 01/22/2014   Dyspnea on exertion 01/11/2023   Ear infection    GERD (gastroesophageal reflux disease)    Hard of hearing    Hypertension 08/23/2023   Laceration of left hand without foreign body 01/04/2021   Mixed dyslipidemia 01/11/2023   Nodule of  middle lobe of right lung 07/29/2020   Added automatically from request for surgery [id number]     Pain in left foot 09/20/2016   Pain in left leg 09/20/2016   Pneumothorax 08/10/2020   Pneumothorax, right 08/10/2020   Salmonella    Sinus infection    Skin lesion 01/22/2014   Wellness examination 02/24/2014  :   Past Surgical History:  Procedure Laterality Date   BRONCHIAL BIOPSY  08/10/2020   Procedure: BRONCHIAL BIOPSIES;  Surgeon: Prudy Brownie, DO;  [facility]: [facility];  Service: Pulmonary;;   BRONCHIAL BRUSHINGS  08/10/2020   Procedure: BRONCHIAL BRUSHINGS;  Surgeon: Prudy Brownie, DO;  [facility];  Service: Pulmonary;;   BRONCHIAL NEEDLE ASPIRATION BIOPSY  08/10/2020   Procedure: BRONCHIAL NEEDLE ASPIRATION BIOPSIES;  Surgeon: Prudy Brownie, DO;  [facility]: [facility];  Service: Pulmonary;;   BRONCHIAL WASHINGS  08/10/2020   Procedure: BRONCHIAL WASHINGS;  Surgeon: Prudy Brownie, DO;  [facility];  Service: Pulmonary;;   COLONOSCOPY     ENDOBRONCHIAL ULTRASOUND  08/10/2020   Procedure:  ENDOBRONCHIAL ULTRASOUND;  Surgeon: Prudy Brownie, DO;  [facility]: [facility];  Service: Pulmonary;;   EYE SURGERY Right    FIDUCIAL MARKER PLACEMENT  08/10/2020   Procedure: FIDUCIAL MARKER PLACEMENT;  Surgeon: Prudy Brownie, DO;  [facility]: [facility];  Service: Pulmonary;;   IR RADIOLOGIST EVAL & MGMT  05/07/2019   IR RADIOLOGIST EVAL & MGMT  05/27/2019   IR RADIOLOGIST EVAL & MGMT  10/23/2019   IR RADIOLOGIST EVAL & MGMT  01/15/2020   VIDEO BRONCHOSCOPY WITH ENDOBRONCHIAL NAVIGATION Right 08/10/2020   Procedure: VIDEO BRONCHOSCOPY WITH ENDOBRONCHIAL NAVIGATION;  Surgeon: Prudy Brownie, DO;  [facility]: [facility];  Service: Pulmonary;  Laterality: Right;  :   Current Outpatient Medications:    acetaminophen  (TYLENOL ) 500 MG tablet, Take 1,000 mg by mouth every 6 (six) hours as needed for mild pain or moderate pain. For pain, Disp: , Rfl:    albuterol  (VENTOLIN  HFA) 108 (90 Base) MCG/ACT inhaler, Inhale 2 puffs into the lungs every 6 (six) hours as needed for wheezing or shortness of breath., Disp: , Rfl:    alendronate  (FOSAMAX ) 35 MG tablet, Take 1 tablet (35 mg total) by mouth every 7 (seven) days. Take with a full glass of water on an empty stomach., Disp: 4 tablet, Rfl: 11   apixaban  (ELIQUIS ) 5 MG TABS tablet, Take 5 mg by mouth 2 (two) times daily., Disp: , Rfl:    clonazePAM  (KLONOPIN ) 0.5 MG tablet, Take 1 tablet by mouth twice daily as needed for anxiety, Disp: 60 tablet, Rfl: 0   flecainide  (TAMBOCOR ) 50 MG tablet, Take 1 tablet (50 mg total) by mouth 2 (two) times daily., Disp: 180 tablet, Rfl: 3   fluticasone  (FLONASE ) 50 MCG/ACT nasal spray, Use 2 spray(s) in each nostril once daily, Disp: 16 g, Rfl: 0   gabapentin  (NEURONTIN ) 100 MG capsule, TAKE 1 CAPSULE BY MOUTH THREE TIMES DAILY, Disp: 90 capsule, Rfl: 0   metoprolol  tartrate (LOPRESSOR ) 25 MG tablet, Take 1 tablet (25 mg total) by mouth 2 (two) times daily., Disp: 180 tablet, Rfl: 3   nitroGLYCERIN  (NITROSTAT ) 0.4 MG  SL tablet, Place 0.4 mg under the tongue every 5 (five) minutes as needed for chest pain., Disp: , Rfl:    rosuvastatin  (CRESTOR ) 10 MG tablet, Take 1 tablet (10 mg total) by mouth daily., Disp: 90 tablet, Rfl: 2   TRELEGY ELLIPTA 100-62.5-25 MCG/ACT AEPB, Inhale 1 puff into  the lungs daily., Disp: , Rfl: :  :   Allergies  Allergen Reactions   Aspirin Other (See Comments)    Acid reflux and drooling  :   Family History  Problem Relation Age of Onset   Hyperlipidemia Father    Stroke Father    Alcohol abuse Father    Throat cancer Brother    Lymphoma Brother    Heart attack Brother   :   Social History   Socioeconomic History   Marital status: Married    Spouse name: Not on file   Number of children: Not on file   Years of education: Not on file   Highest education level: Not on file  Occupational History   Not on file  Tobacco Use   Smoking status: Former    Current packs/day: 1.00    Average packs/day: 1 pack/day for 55.0 years (55.0 ttl pk-yrs)    Types: Cigarettes   Smokeless tobacco: Never   Tobacco comments:    down to 0.5ppd  Vaping Use   Vaping status: Never Used  Substance and Sexual Activity   Alcohol use: Never   Drug use: No   Sexual activity: Not Currently    Birth control/protection: None  Other Topics Concern   Not on file  Social History Narrative   Not on file   Social Drivers of Health   Financial Resource Strain: Low Risk  (04/11/2021)   Overall Financial Resource Strain (CARDIA)    Difficulty of Paying Living Expenses: Not hard at all  Food Insecurity: No Food Insecurity (10/16/2023)   Hunger Vital Sign    Worried About Running Out of Food in the Last Year: Never true    Ran Out of Food in the Last Year: Never true  Transportation Needs: No Transportation Needs (10/16/2023)   PRAPARE - Administrator, Civil Service (Medical): No    Lack of Transportation (Non-Medical): No  Physical Activity: Inactive (04/11/2021)    Exercise Vital Sign    Days of Exercise per Week: 0 days    Minutes of Exercise per Session: 0 min  Stress: No Stress Concern Present (02/20/2022)   Received from [facility]ational Health - Occupational Stress Questionnaire    Feeling of Stress : Not at all  Social Connections: Unknown (09/02/2021)   Received from [facility]   Social Network    Social Network: Not on file  Intimate Partner Violence: Not At Risk (10/16/2023)   Humiliation, Afraid, Rape, and Kick questionnaire    Fear of Current or Ex-Partner: No    Emotionally Abused: No    Physically Abused: No    Sexually Abused: No  :  Review of Systems  Constitutional: Negative.   HENT: Negative.    Eyes: Negative.   Respiratory: Negative.    Cardiovascular: Negative.   Gastrointestinal: Negative.   Genitourinary: Negative.   Musculoskeletal: Negative.   Skin: Negative.   Neurological: Negative.   Endo/Heme/Allergies: Negative.   Psychiatric/Behavioral: Negative.       Exam:    Vital signs show temperature of 97.6.  Pulse 52.  Blood pressure 123/66.  Weight is 194 pounds.  @IPVITALS @ Physical Exam Vitals reviewed.  HENT:     Head: Normocephalic and atraumatic.   Eyes:     Pupils: Pupils are equal, round, and reactive to light.    Cardiovascular:     Rate and Rhythm: Normal rate and regular rhythm.     Heart sounds: Normal  heart sounds.  Pulmonary:     Effort: Pulmonary effort is normal.     Breath sounds: Normal breath sounds.  Abdominal:     General: Bowel sounds are normal.     Palpations: Abdomen is soft.   Musculoskeletal:        General: No tenderness or deformity. Normal range of motion.     Cervical back: Normal range of motion.  Lymphadenopathy:     Cervical: No cervical adenopathy.   Skin:    General: Skin is warm and dry.     Findings: No erythema or rash.   Neurological:     Mental Status: He is alert and oriented to person, place, and time.    Psychiatric:        Behavior: Behavior normal.        Thought Content: Thought content normal.        Judgment: Judgment normal.     Recent Labs    10/15/23 1034 10/16/23 1111  WBC 7.3 6.1  HGB 14.4 13.1  HCT 43.7 39.0  PLT 254 230    Recent Labs    10/15/23 1034 10/16/23 1111  NA 139 139  K 4.2 3.9  CL 107 106  CO2 23 27  GLUCOSE 87 89  BUN 13 14  CREATININE 0.68* 0.72  CALCIUM  8.8 8.9    Blood smear review: None  Pathology: None    Assessment and Plan: Mr. Lever is a very nice 73 year old white male.  He has multiple malignancies.  It seems as if the active 1 is in the bladder and possibly the right kidney.  Again I talked him about the right kidney lesion.  Again I told him that with newer surgical techniques, surgery is not nearly as invasive.  I would think that he could possibly have a laparoscopic resection of part of the kidney if necessary.  I would just hate to see this tumor grow and then start to metastasize.  Again he is going have a cystoscopy and likely bladder biopsies in August.  Again I am not sure what counter medicine he had before with respect to the recurrence of the superficial bladder cancer.  I think would be worthwhile to get a follow-up CT scan.  We will get 1 set up in August for him.  Again, I will have to worry about this right renal mass.  I think this is the one that we are going to have problems with if we cannot get it taken care of.  Thankfully is not smoking so the history of the lung cancer should not be a problem.  I will plan to see him back after he has the CT scan done.

## 2023-10-18 ENCOUNTER — Other Ambulatory Visit: Payer: Self-pay | Admitting: Medical

## 2023-10-18 NOTE — Telephone Encounter (Unsigned)
 Copied from CRM [id number]. Topic: Clinical - Medication Refill >> Oct 18, 2023  1:55 PM Trula Gable C wrote: Medication: clonazePAM  (KLONOPIN ) 0.5 MG tablet   Has the patient contacted their pharmacy? Yes (Agent: If no, request that the patient contact the pharmacy for the refill. If patient does not wish to contact the pharmacy document the reason why and proceed with request.) (Agent: If yes, when and what did the pharmacy advise?)  This is the patient's preferred pharmacy:  [id number] - [place], [place] - [address] [place] [address] Phone: [phone] Fax: [phone]  Is this the correct pharmacy for this prescription? Yes If no, delete pharmacy and type the correct one.   Has the prescription been filled recently? No  Is the patient out of the medication? Yes  Has the patient been seen for an appointment in the last year OR does the patient have an upcoming appointment? Yes  Can we respond through MyChart? Yes  Agent: Please be advised that Rx refills may take up to 3 business days. We ask that you follow-up with your pharmacy.

## 2023-10-18 NOTE — Telephone Encounter (Signed)
 Requesting: Klonopin  0.5 mg Contract: 03/19/2023 UDS: 03/19/2023 Last Visit: 10/15/2023 Next Visit: N/A Last Refill: 09/11/2023  Please Advise

## 2023-10-19 MED ORDER — CLONAZEPAM 0.5 MG PO TABS: 0.5000 mg | ORAL_TABLET | Freq: Two times a day (BID) | ORAL | 0 refills | Status: DC | PRN

## 2023-10-19 NOTE — Telephone Encounter (Signed)
 Rx refill sent to pharmacy.

## 2023-10-21 ENCOUNTER — Other Ambulatory Visit: Payer: Self-pay | Admitting: Medical

## 2023-10-24 ENCOUNTER — Encounter: Payer: Self-pay | Admitting: *Deleted

## 2023-10-24 NOTE — Progress Notes (Signed)
 Patient was seen when this navigator was out of the office.   Patient has a history of stage 1A lung cancer in 2022, treated with SBRT. He was referred for a recently recurrent superficial bladder cancer. This was initially treated with surgical resection in 01/2022 with recurrence documented 04/2023. He also has a renal mass, identified back in 2019 which is currently stable on imaging.   Per Dr Timmy, the renal mass is most concerning and he would like patient to consider surgery. At this time, he is not interested. He will return to the office in August with a CT done shortly before that time. Will follow up closer to that time for CT scheduling.   Oncology Nurse Navigator Documentation     10/24/2023   11:45 AM  Oncology Nurse Navigator Flowsheets  Navigator Follow Up Date: 11/12/2023  Navigator Follow Up Reason: Radiology  Navigator Location [facility]  Referral Date to RadOnc/MedOnc 10/15/2023  Navigator Encounter Type Appt/Treatment Plan Review  Patient Visit Type MedOnc  Treatment Phase Follow-up  Barriers/Navigation Needs No Barriers At This Time  Interventions Other  Acuity Level 1-No Barriers  Time Spent with Patient 30

## 2023-10-25 ENCOUNTER — Other Ambulatory Visit: Payer: Self-pay | Admitting: Medical

## 2023-10-29 ENCOUNTER — Ambulatory Visit: Admitting: Cardiology

## 2023-11-12 ENCOUNTER — Telehealth: Payer: Self-pay | Admitting: Cardiology

## 2023-11-12 MED ORDER — METOPROLOL TARTRATE 25 MG PO TABS: 25.0000 mg | ORAL_TABLET | Freq: Two times a day (BID) | ORAL | 2 refills | Status: DC

## 2023-11-12 NOTE — Telephone Encounter (Signed)
 RX sent to requested Pharmacy

## 2023-11-12 NOTE — Telephone Encounter (Signed)
*  STAT* If patient is at the pharmacy, call can be transferred to refill team.   1. Which medications need to be refilled? (please list name of each medication and dose if known)   metoprolol  tartrate (LOPRESSOR ) 25 MG tablet    2. Which pharmacy/location (including street and city if local pharmacy) is medication to be sent to?  [id number] - [place], [place] - [address]      3. Do they need a 30 day or 90 day supply? 90 day    Pt is out of medication

## 2023-11-13 ENCOUNTER — Telehealth: Payer: Self-pay | Admitting: *Deleted

## 2023-11-13 NOTE — Telephone Encounter (Signed)
 Left detailed instructions for ETT. Flecainide  monitoring, do not hold beta blocker.

## 2023-11-16 ENCOUNTER — Other Ambulatory Visit: Payer: Self-pay | Admitting: Medical

## 2023-11-19 NOTE — Telephone Encounter (Signed)
 Rx refill sent to pharmacy.

## 2023-11-19 NOTE — Telephone Encounter (Signed)
 Requesting: clonazepam  0.5mg   Contract: 03/19/23 UDS: 03/19/23 Last Visit: 10/16/23 Next Visit: None Last Refill: 10/19/23 #60 and 0RF   Please Advise

## 2023-11-20 ENCOUNTER — Ambulatory Visit: Payer: Self-pay

## 2023-11-20 ENCOUNTER — Ambulatory Visit

## 2023-11-20 DIAGNOSIS — I484 Atypical atrial flutter: Secondary | ICD-10-CM | POA: Diagnosis not present

## 2023-11-20 LAB — EXERCISE TOLERANCE TEST
Angina Index: 0
Base ST Depression (mm): 0 mm
Duke Treadmill Score: 3
Estimated workload: 4.6
Exercise duration (min): 2 min
Exercise duration (sec): 59 s
MPHR: 146 {beats}/min
Peak HR: 100 {beats}/min
Percent HR: 68 %
Rest HR: 66 {beats}/min
ST Depression (mm): 0 mm

## 2023-11-20 NOTE — Progress Notes (Signed)
 Please let him know stress test was limited due to poor heart rate response. In this setting discontinue flecainide . Please recommend he see Dr. Inocencio electrophysiologist for further rhythm control of atrial flutter and evaluate for conduction abnormalities noted on treadmill EKG.  Thank you

## 2023-11-21 NOTE — Telephone Encounter (Signed)
 Left voice message to return call

## 2023-11-21 NOTE — Telephone Encounter (Signed)
-----   Message from [place] R Madireddy sent at 11/20/2023  6:50 PM EDT ----- Please let him know stress test was limited due to poor heart rate response. In this setting discontinue flecainide . Please recommend he see Dr. Inocencio electrophysiologist for further rhythm control of atrial flutter and evaluate for conduction abnormalities noted on treadmill EKG.  Thank you ----- Message ----- From: Liborio Alean SAUNDERS, MD Sent: 11/20/2023   6:49 PM EDT To: Alean SAUNDERS Liborio, MD

## 2023-11-26 ENCOUNTER — Ambulatory Visit

## 2023-11-26 VITALS — BP 124/78 | HR 69 | Ht 66.0 in | Wt 196.0 lb

## 2023-11-26 DIAGNOSIS — I10 Essential (primary) hypertension: Secondary | ICD-10-CM

## 2023-11-26 DIAGNOSIS — I484 Atypical atrial flutter: Secondary | ICD-10-CM | POA: Diagnosis not present

## 2023-11-26 DIAGNOSIS — I2510 Atherosclerotic heart disease of native coronary artery without angina pectoris: Secondary | ICD-10-CM

## 2023-11-26 DIAGNOSIS — E782 Mixed hyperlipidemia: Secondary | ICD-10-CM | POA: Diagnosis not present

## 2023-11-26 NOTE — Assessment & Plan Note (Signed)
 Remains asymptomatic. Elevated CHA2DS2-VASc score. Continue with anticoagulation using Eliquis  5 mg twice daily.   Given overall risk with antiarrhythmic such as flecainide  given his age and coronary atherosclerosis, and recent stress test results, would discontinue flecainide .  If he has any further recurrent episodes of atrial arrhythmias will refer to electrophysiologist, Dr. Inocencio to consider alternative antiarrhythmics such as Multaq versus Tikosyn versus sotalol.

## 2023-11-26 NOTE — Assessment & Plan Note (Addendum)
 coronary atherosclerosis with cardiac CT coronary angiogram at [facility] from September 2024 noting calcium  score 181, CAD RADS-N study nondiagnostic due to motion artifact send underwent functional testing with Lexiscan  stress test October 2024 that showed low risk study and no ischemia   Poor functional status on treadmill EKG stress test from 11/20/2023 2 minutes 59 seconds with 68% MPHR attained.  Continue with Eliquis  as he is on for atrial flutter. Continue with rosuvastatin  10 mg once daily.

## 2023-11-26 NOTE — Progress Notes (Signed)
 Cardiology Consultation:    Date:  11/26/2023   ID:  Patrick Morris, DOB 1950-08-20, MRN [id number]  PCP:  Dorina Loving, PA-C  Cardiologist:  Alean JONELLE Kerin Kren, MD   Referring MD: Dorina Loving, PA-C   Chief Complaint  Patient presents with   Follow-up     ASSESSMENT AND PLAN:   Mr. Schreifels 73 year old male history of atypical atrial flutter on rhythm control with flecainide  [started in October 2024; being discontinued today] and metoprolol  and anticoagulation with Eliquis , COPD, lung cancer, former smoker,, coronary atherosclerosis with cardiac CT coronary angiogram at [facility] from September 2024 noting calcium  score 181, CAD RADS-N study nondiagnostic due to motion artifact send underwent functional testing with Lexiscan  stress test October 2024 that showed low risk study and no ischemia on recent, GERD, BPH, history of GI bleed, LVEF 50 to 55% and mild mitral regurgitation on echocardiogram October 2024.  Here for follow-up visit after his treadmill EKG stress test 11/20/2023 Problem List Items Addressed This Visit     Coronary artery calcification   coronary atherosclerosis with cardiac CT coronary angiogram at [facility] from September 2024 noting calcium  score 181, CAD RADS-N study nondiagnostic due to motion artifact send underwent functional testing with Lexiscan  stress test October 2024 that showed low risk study and no ischemia   Poor functional status on treadmill EKG stress test from 11/20/2023 2 minutes 59 seconds with 68% MPHR attained.  Continue with Eliquis  as he is on for atrial flutter. Continue with rosuvastatin  10 mg once daily.       Atypical atrial flutter (HCC) - Primary   Remains asymptomatic. Elevated CHA2DS2-VASc score. Continue with anticoagulation using Eliquis  5 mg twice daily.   Given overall risk with antiarrhythmic such as flecainide  given his age and coronary atherosclerosis, and recent stress test results,  would discontinue flecainide .  If he has any further recurrent episodes of atrial arrhythmias will refer to electrophysiologist, Dr. Inocencio to consider alternative antiarrhythmics such as Multaq versus Tikosyn versus sotalol.        Mixed dyslipidemia   Lipid panel from 10/15/2023 total cholesterol 101, HDL 30, LDL 57, triglycerides 61, optimal. Continue rosuvastatin  10 mg once daily.       Hypertension   Well-controlled. Continue metoprolol  tartrate 25 mg twice daily.       Return to clinic tentatively in 6 months.    History of Present Illness:    Patrick Morris is a 73 y.o. male who is being seen today for t visit. PCP is Saguier, Loving, PA-C. Last visit with me in the office was 08-23-2023.  Here for the visit accompanied by his wife.  Can continues to keep himself physically busy and active.  Once he overdoes anything in hot weather he tends to get out of breath.  Otherwise no symptoms at rest or with mild to moderate activities.  history of atypical atrial flutter on rhythm control with flecainide  [started in October 2024] and metoprolol  and anticoagulation with Eliquis , COPD, lung cancer, former smoker, coronary atherosclerosis with cardiac CT coronary angiogram at [facility] from September 2024 noting calcium  score 181, CAD RADS-N study nondiagnostic due to motion artifact send underwent functional testing with Lexiscan  stress test October 2024 that showed low risk study and no ischemia on recent, GERD, BPH, history of GI bleed, LVEF 50 to 55% and mild mitral regurgitation on echocardiogram October 2024.   Given his age and coronary atherosclerosis reviewed with stress test exercise 11/20/2023 which noted below  average exercise capacity 2 minutes 59 seconds, attaining 68% MPHR, 4.6 METS, with type I second-degree AV block at rest with not seen with activity, frequent PACs noted in recovery, with poor effort tolerance and underlying baseline EKG changes  recommended to discontinue flecainide .  EKG 11/20/2023 noted sinus rhythm with type I second-degree AV block, QRS duration 110 ms, QTc 471 ms.  Overall he has been doing well and continues to be at his baseline. Good compliance with his medications. At times mild pinkish urine but no frank hematuria recently.  Continues to be on anticoagulant. Past Medical History:  Diagnosis Date   Atypical atrial flutter [facility])    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   Cataract    Cellulitis of foot 01/04/2021   Chronic anticoagulation    Colitis    COPD (chronic obstructive pulmonary disease) (HCC)    COPD exacerbation (HCC) 01/22/2014   Coronary artery calcification    Cough 01/22/2014   Dyspnea on exertion 01/11/2023   Ear infection    GERD (gastroesophageal reflux disease)    Hard of hearing    Hypertension 08/23/2023   Laceration of left hand without foreign body 01/04/2021   Lung cancer, middle lobe (HCC) 10/16/2023   Mixed dyslipidemia 01/11/2023   Nodule of middle lobe of right lung 07/29/2020   Added automatically from request for surgery [id number]     Pain in left foot 09/20/2016   Pain in left leg 09/20/2016   Pneumothorax 08/10/2020   Pneumothorax, right 08/10/2020   Salmonella    Sinus infection    Skin lesion 01/22/2014   Wellness examination 02/24/2014    Past Surgical History:  Procedure Laterality Date   BRONCHIAL BIOPSY  08/10/2020   Procedure: BRONCHIAL BIOPSIES;  Surgeon: Brenna Adine CROME, DO;  Location: [facility];  Service: Pulmonary;;   BRONCHIAL BRUSHINGS  08/10/2020   Procedure: BRONCHIAL BRUSHINGS;  Surgeon: Brenna Adine CROME, DO;  Location: [facility];  Service: Pulmonary;;   BRONCHIAL NEEDLE ASPIRATION BIOPSY  08/10/2020   Procedure: BRONCHIAL NEEDLE ASPIRATION BIOPSIES;  Surgeon: Brenna Adine CROME, DO;  Location: [facility];  Service: Pulmonary;;    BRONCHIAL WASHINGS  08/10/2020   Procedure: BRONCHIAL WASHINGS;  Surgeon: Brenna Adine CROME, DO;  Location: [facility];  Service: Pulmonary;;   COLONOSCOPY     ENDOBRONCHIAL ULTRASOUND  08/10/2020   Procedure: ENDOBRONCHIAL ULTRASOUND;  Surgeon: Brenna Adine CROME, DO;  Location: [facility];  Service: Pulmonary;;   EYE SURGERY Right    FIDUCIAL MARKER PLACEMENT  08/10/2020   Procedure: FIDUCIAL MARKER PLACEMENT;  Surgeon: Brenna Adine CROME, DO;  Location: [facility];  Service: Pulmonary;;   IR RADIOLOGIST EVAL & MGMT  05/07/2019   IR RADIOLOGIST EVAL & MGMT  05/27/2019   IR RADIOLOGIST EVAL & MGMT  10/23/2019   IR RADIOLOGIST EVAL & MGMT  01/15/2020   VIDEO BRONCHOSCOPY WITH ENDOBRONCHIAL NAVIGATION Right 08/10/2020   Procedure: VIDEO BRONCHOSCOPY WITH ENDOBRONCHIAL NAVIGATION;  Surgeon: Brenna Adine CROME, DO;  Location: [facility];  Service: Pulmonary;  Laterality: Right;    Current Medications: Current Meds  Medication Sig   acetaminophen  (TYLENOL ) 500 MG tablet Take 1,000 mg by mouth every 6 (six) hours as needed for mild pain or moderate pain. For pain   albuterol  (VENTOLIN  HFA) 108 (90 Base) MCG/ACT inhaler Inhale 2 puffs into the lungs every 6 (six) hours as needed for wheezing or shortness of breath.   alendronate  (  FOSAMAX ) 35 MG tablet Take 1 tablet (35 mg total) by mouth every 7 (seven) days. Take with a full glass of water on an empty stomach.   apixaban  (ELIQUIS ) 5 MG TABS tablet Take 5 mg by mouth 2 (two) times daily.   clonazePAM  (KLONOPIN ) 0.5 MG tablet Take 1 tablet by mouth twice daily as needed for anxiety   fluticasone  (FLONASE ) 50 MCG/ACT nasal spray Use 2 spray(s) in each nostril once daily   gabapentin  (NEURONTIN ) 100 MG capsule TAKE 1 CAPSULE BY MOUTH THREE TIMES DAILY   metoprolol  tartrate (LOPRESSOR ) 25 MG tablet Take 1 tablet (25 mg total) by mouth 2 (two) times daily.   nitroGLYCERIN  (NITROSTAT ) 0.4 MG SL tablet Place 0.4 mg under the tongue every 5 (five) minutes as  needed for chest pain.   rosuvastatin  (CRESTOR ) 10 MG tablet Take 1 tablet (10 mg total) by mouth daily.   TRELEGY ELLIPTA 100-62.5-25 MCG/ACT AEPB Inhale 1 puff into the lungs daily.   [DISCONTINUED] flecainide  (TAMBOCOR ) 50 MG tablet Take 1 tablet (50 mg total) by mouth 2 (two) times daily.     Allergies:   Aspirin   Social History   Socioeconomic History   Marital status: Married    Spouse name: Not on file   Number of children: Not on file   Years of education: Not on file   Highest education level: Not on file  Occupational History   Not on file  Tobacco Use   Smoking status: Former    Current packs/day: 1.00    Average packs/day: 1 pack/day for 55.0 years (55.0 ttl pk-yrs)    Types: Cigarettes   Smokeless tobacco: Never   Tobacco comments:    down to 0.5ppd  Vaping Use   Vaping status: Never Used  Substance and Sexual Activity   Alcohol use: Never   Drug use: No   Sexual activity: Not Currently    Birth control/protection: None  Other Topics Concern   Not on file  Social History Narrative   Not on file   Social Drivers of Health   Financial Resource Strain: Low Risk  (04/11/2021)   Overall Financial Resource Strain (CARDIA)    Difficulty of Paying Living Expenses: Not hard at all  Food Insecurity: No Food Insecurity (10/16/2023)   Hunger Vital Sign    Worried About Running Out of Food in the Last Year: Never true    Ran Out of Food in the Last Year: Never true  Transportation Needs: No Transportation Needs (10/16/2023)   PRAPARE - Administrator, Civil Service (Medical): No    Lack of Transportation (Non-Medical): No  Physical Activity: Inactive (04/11/2021)   Exercise Vital Sign    Days of Exercise per Week: 0 days    Minutes of Exercise per Session: 0 min  Stress: No Stress Concern Present (02/20/2022)   Received from [facility] of Occupational Health - Occupational Stress Questionnaire    Feeling of Stress : Not at all   Social Connections: Unknown (09/02/2021)   Received from [facility]   Social Network    Social Network: Not on file     Family History: The patient's family history includes Alcohol abuse in his father; Heart attack in his brother; Hyperlipidemia in his father; Lymphoma in his brother; Stroke in his father; Throat cancer in his brother. ROS:   Please see the history of present illness.    All 14 point review of systems negative except as described  per history of present illness.  EKGs/Labs/Other Studies Reviewed:    The following studies were reviewed today:   EKG:       Recent Labs: 10/16/2023: ALT 9; BUN 14; Creatinine 0.72; Hemoglobin 13.1; Platelet Count 230; Potassium 3.9; Sodium 139  Recent Lipid Panel    Component Value Date/Time   CHOL 101 10/15/2023 1034   CHOL 109 01/02/2023 1115   TRIG 61 10/15/2023 1034   HDL 30 (L) 10/15/2023 1034   HDL 27 (L) 01/02/2023 1115   CHOLHDL 3.4 10/15/2023 1034   VLDL 16.2 09/11/2017 0843   LDLCALC 57 10/15/2023 1034    Physical Exam:    VS:  BP 124/78   Pulse 69   Ht 5' 6 (1.676 m)   Wt 196 lb (88.9 kg)   SpO2 97%   BMI 31.64 kg/m     Wt Readings from Last 3 Encounters:  11/26/23 196 lb (88.9 kg)  10/16/23 194 lb (88 kg)  10/15/23 192 lb (87.1 kg)     GENERAL:  Well nourished, well developed in no acute distress NECK: No JVD; No carotid bruits CARDIAC: RRR, S1 and S2 present, no murmurs, no rubs, no gallops CHEST:  Clear to auscultation without rales, wheezing or rhonchi  Extremities: No pitting pedal edema. Pulses bilaterally symmetric with radial 2+ and dorsalis pedis 2+ NEUROLOGIC:  Alert and oriented x 3  Medication Adjustments/Labs and Tests Ordered: Current medicines are reviewed at length with the patient today.  Concerns regarding medicines are outlined above.  No orders of the defined types were placed in this encounter.  No orders of the defined types were placed in this  encounter.   Signed, Alean jess Kobus, MD, MPH, [facility]. 11/26/2023 3:07 PM    [facility]

## 2023-11-26 NOTE — Patient Instructions (Addendum)
 Medication Instructions:  Your physician has recommended you make the following change in your medication:   STOP: Flecainide (Tambacor)  *If you need a refill on your cardiac medications before your next appointment, please call your pharmacy*  Lab Work: None If you have labs (blood work) drawn today and your tests are completely normal, you will receive your results only by: MyChart Message (if you have MyChart) OR A paper copy in the mail If you have any lab test that is abnormal or we need to change your treatment, we will call you to review the results.  Testing/Procedures: None  Follow-Up: At [facility], you and your health needs are our priority.  As part of our continuing mission to provide you with exceptional heart care, our providers are all part of one team.  This team includes your primary Cardiologist (physician) and Advanced Practice Providers or APPs (Physician Assistants and Nurse Practitioners) who all work together to provide you with the care you need, when you need it.  Your next appointment:   6 month(s)  Provider:   Alean Kobus, MD    We recommend signing up for the patient portal called MyChart.  Sign up information is provided on this After Visit Summary.  MyChart is used to connect with patients for Virtual Visits (Telemedicine).  Patients are able to view lab/test results, encounter notes, upcoming appointments, etc.  Non-urgent messages can be sent to your provider as well.   To learn more about what you can do with MyChart, go to [email or web address].   Other Instructions None

## 2023-11-26 NOTE — Assessment & Plan Note (Signed)
 Lipid panel from 10/15/2023 total cholesterol 101, HDL 30, LDL 57, triglycerides 61, optimal. Continue rosuvastatin  10 mg once daily.

## 2023-11-26 NOTE — Assessment & Plan Note (Signed)
Well controlled. Continue metoprolol tartrate 25 mg twice daily.

## 2023-12-05 ENCOUNTER — Ambulatory Visit (INDEPENDENT_AMBULATORY_CARE_PROVIDER_SITE_OTHER)

## 2023-12-05 VITALS — BP 124/78 | Ht 66.0 in | Wt 196.0 lb

## 2023-12-05 DIAGNOSIS — Z0000 Encounter for general adult medical examination without abnormal findings: Secondary | ICD-10-CM

## 2023-12-05 NOTE — Progress Notes (Signed)
 Because this visit was a virtual/telehealth visit,  certain criteria was not obtained, such a blood pressure, CBG if applicable, and timed get up and go. Any medications not marked as taking were not mentioned during the medication reconciliation part of the visit. Any vitals not documented were not able to be obtained due to this being a telehealth visit or patient was unable to self-report a recent blood pressure reading due to a lack of equipment at home via telehealth. Vitals that have been documented are verbally provided by the patient.  This visit was performed by a medical professional under my direct supervision. I was immediately available for consultation/collaboration. I have reviewed and agree with the Annual Wellness Visit documentation.  Subjective:   Patrick Morris is a 73 y.o. who presents for a Medicare Wellness preventive visit.  As a reminder, Annual Wellness Visits don't include a physical exam, and some assessments may be limited, especially if this visit is performed virtually. We may recommend an in-person follow-up visit with your provider if needed.  Visit Complete: Virtual I connected with  Patrick Morris on 12/05/23 by a audio enabled telemedicine application and verified that I am speaking with the correct person using two identifiers.  Patient Location: Home  Provider Location: Home Office  I discussed the limitations of evaluation and management by telemedicine. The patient expressed understanding and agreed to proceed.  Vital Signs: Because this visit was a virtual/telehealth visit, some criteria may be missing or patient reported. Any vitals not documented were not able to be obtained and vitals that have been documented are patient reported.  VideoDeclined- This patient declined Librarian, academic. Therefore the visit was completed with audio only.  Persons Participating in Visit: Patient.  AWV Questionnaire: No: Patient Medicare  AWV questionnaire was not completed prior to this visit.  Cardiac Risk Factors include: advanced age (>57men, >62 women);male gender;obesity (BMI >30kg/m2)     Objective:    Today's Vitals   12/05/23 1621  BP: 124/78  Weight: 196 lb (88.9 kg)  Height: 5' 6 (1.676 m)   Body mass index is 31.64 kg/m.     12/05/2023    4:25 PM 10/16/2023   11:30 AM 04/11/2021    1:47 PM 11/14/2020    2:26 PM 08/10/2020    4:54 PM 08/10/2020    6:44 AM 07/01/2020    9:53 AM  Advanced Directives  Does Patient Have a Medical Advance Directive? No No No No No No No  Would patient like information on creating a medical advance directive? No - Patient declined No - Patient declined Yes (MAU/Ambulatory/Procedural Areas - Information given) No - Patient declined No - Patient declined      Current Medications (verified) Outpatient Encounter Medications as of 12/05/2023  Medication Sig   acetaminophen  (TYLENOL ) 500 MG tablet Take 1,000 mg by mouth every 6 (six) hours as needed for mild pain or moderate pain. For pain   albuterol  (VENTOLIN  HFA) 108 (90 Base) MCG/ACT inhaler Inhale 2 puffs into the lungs every 6 (six) hours as needed for wheezing or shortness of breath.   alendronate  (FOSAMAX ) 35 MG tablet Take 1 tablet (35 mg total) by mouth every 7 (seven) days. Take with a full glass of water on an empty stomach.   apixaban  (ELIQUIS ) 5 MG TABS tablet Take 5 mg by mouth 2 (two) times daily.   clonazePAM  (KLONOPIN ) 0.5 MG tablet Take 1 tablet by mouth twice daily as needed for anxiety   fluticasone  (  FLONASE ) 50 MCG/ACT nasal spray Use 2 spray(s) in each nostril once daily   gabapentin  (NEURONTIN ) 100 MG capsule TAKE 1 CAPSULE BY MOUTH THREE TIMES DAILY   metoprolol  tartrate (LOPRESSOR ) 25 MG tablet Take 1 tablet (25 mg total) by mouth 2 (two) times daily.   nitroGLYCERIN  (NITROSTAT ) 0.4 MG SL tablet Place 0.4 mg under the tongue every 5 (five) minutes as needed for chest pain.   rosuvastatin  (CRESTOR ) 10 MG tablet  Take 1 tablet (10 mg total) by mouth daily.   TRELEGY ELLIPTA 100-62.5-25 MCG/ACT AEPB Inhale 1 puff into the lungs daily.   No facility-administered encounter medications on file as of 12/05/2023.    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   Cataract    Cellulitis of foot 01/04/2021   Chronic anticoagulation    Colitis    COPD (chronic obstructive pulmonary disease) (HCC)    COPD exacerbation (HCC) 01/22/2014   Coronary artery calcification    Cough 01/22/2014   Dyspnea on exertion 01/11/2023   Ear infection    GERD (gastroesophageal reflux disease)    Hard of hearing    Hypertension 08/23/2023   Laceration of left hand without foreign body 01/04/2021   Lung cancer, middle lobe (HCC) 10/16/2023   Mixed dyslipidemia 01/11/2023   Nodule of middle lobe of right lung 07/29/2020   Added automatically from request for surgery [id number]     Pain in left foot 09/20/2016   Pain in left leg 09/20/2016   Pneumothorax 08/10/2020   Pneumothorax, right 08/10/2020   Salmonella    Sinus infection    Skin lesion 01/22/2014   Wellness examination 02/24/2014   Past Surgical History:  Procedure Laterality Date   BRONCHIAL BIOPSY  08/10/2020   Procedure: BRONCHIAL BIOPSIES;  Surgeon: Brenna Adine CROME, DO;  Location: [facility];  Service: Pulmonary;;   BRONCHIAL BRUSHINGS  08/10/2020   Procedure: BRONCHIAL BRUSHINGS;  Surgeon: Brenna Adine CROME, DO;  Location: [facility];  Service: Pulmonary;;   BRONCHIAL NEEDLE ASPIRATION BIOPSY  08/10/2020   Procedure: BRONCHIAL NEEDLE ASPIRATION BIOPSIES;  Surgeon: Brenna Adine CROME, DO;  Location: [facility];  Service: Pulmonary;;   BRONCHIAL WASHINGS  08/10/2020   Procedure: BRONCHIAL WASHINGS;  Surgeon: Brenna Adine CROME, DO;  Location: [facility];  Service: Pulmonary;;   COLONOSCOPY     ENDOBRONCHIAL ULTRASOUND  08/10/2020   Procedure: ENDOBRONCHIAL ULTRASOUND;  Surgeon: Brenna Adine CROME, DO;  Location: [facility];  Service: Pulmonary;;   EYE SURGERY Right    FIDUCIAL MARKER PLACEMENT  08/10/2020   Procedure: FIDUCIAL MARKER PLACEMENT;  Surgeon: Brenna Adine CROME, DO;  Location: [facility];  Service: Pulmonary;;   IR RADIOLOGIST EVAL & MGMT  05/07/2019   IR RADIOLOGIST EVAL & MGMT  05/27/2019   IR RADIOLOGIST EVAL & MGMT  10/23/2019   IR RADIOLOGIST EVAL & MGMT  01/15/2020   VIDEO BRONCHOSCOPY WITH ENDOBRONCHIAL NAVIGATION Right 08/10/2020   Procedure: VIDEO BRONCHOSCOPY WITH ENDOBRONCHIAL NAVIGATION;  Surgeon: Brenna Adine CROME, DO;  Location: [facility];  Service: Pulmonary;  Laterality: Right;   Family History  Problem Relation Age of Onset   Hyperlipidemia Father    Stroke Father    Alcohol abuse Father    Throat cancer Brother    Lymphoma Brother    Heart attack Brother  Social History   Socioeconomic History   Marital status: Married    Spouse name: Not on file   Number of children: Not on file   Years of education: Not on file   Highest education level: Not on file  Occupational History   Not on file  Tobacco Use   Smoking status: Former    Current packs/day: 1.00    Average packs/day: 1 pack/day for 55.0 years (55.0 ttl pk-yrs)    Types: Cigarettes   Smokeless tobacco: Never   Tobacco comments:    down to 0.5ppd  Vaping Use   Vaping status: Never Used  Substance and Sexual Activity   Alcohol use: Never   Drug use: No   Sexual activity: Not Currently    Birth control/protection: None  Other Topics Concern   Not on file  Social History Narrative   Not on file   Social Drivers of Health   Financial Resource Strain: Low Risk  (12/05/2023)   Overall Financial Resource Strain (CARDIA)    Difficulty of Paying Living Expenses: Not hard at all  Food Insecurity: No Food Insecurity (12/05/2023)    Hunger Vital Sign    Worried About Running Out of Food in the Last Year: Never true    Ran Out of Food in the Last Year: Never true  Transportation Needs: No Transportation Needs (12/05/2023)   PRAPARE - Administrator, Civil Service (Medical): No    Lack of Transportation (Non-Medical): No  Physical Activity: Insufficiently Active (12/05/2023)   Exercise Vital Sign    Days of Exercise per Week: 3 days    Minutes of Exercise per Session: 30 min  Stress: No Stress Concern Present (12/05/2023)   [employer] of Occupational Health - Occupational Stress Questionnaire    Feeling of Stress: Not at all  Social Connections: Moderately Isolated (12/05/2023)   Social Connection and Isolation Panel    Frequency of Communication with Friends and Family: More than three times a week    Frequency of Social Gatherings with Friends and Family: More than three times a week    Attends Religious Services: Never    Database administrator or Organizations: No    Attends Engineer, structural: Never    Marital Status: Married    Tobacco Counseling Counseling given: Not Answered Tobacco comments: down to 0.5ppd    Clinical Intake:  Pre-visit preparation completed: Yes  Pain : No/denies pain     BMI - recorded: 31.64 Nutritional Status: BMI > 30  Obese Nutritional Risks: None  No results found for: HGBA1C   How often do you need to have someone help you when you read instructions, pamphlets, or other written materials from your doctor or pharmacy?: 1 - Never What is the last grade level you completed in school?: 9th grade  Interpreter Needed?: No  Information entered by :: Mickie Kozikowski,cma   Activities of Daily Living     12/05/2023    4:24 PM  In your present state of health, do you have any difficulty performing the following activities:  Hearing? 0  Vision? 0  Difficulty concentrating or making decisions? 0  Walking or climbing stairs? 0  Dressing or  bathing? 0  Doing errands, shopping? 0  Preparing Food and eating ? N  Using the Toilet? N  In the past six months, have you accidently leaked urine? N  Do you have problems with loss of bowel control? N  Managing your Medications? N  Managing  your Finances? N  Housekeeping or managing your Housekeeping? N    Patient Care Team: Saguier, Edward, PA-C as PCP - General (Physician Assistant) Monetta Redell PARAS, MD as PCP - Cardiology (Cardiology) Timmy Maude SAUNDERS, MD as Medical Oncologist (Oncology) Randine Warren BRAVO, RN as Oncology Nurse Navigator  I have updated your Care Teams any recent Medical Services you may have received from other providers in the past year.     Assessment:   This is a routine wellness examination for [place].  Hearing/Vision screen Hearing Screening - Comments:: No difficulties  Vision Screening - Comments:: Patient has glasses    Goals Addressed             This Visit's Progress    Maintain current health   On track    Patient Stated   On track    Walking more       Depression Screen     12/05/2023    4:26 PM 10/16/2023   11:35 AM 06/09/2022    2:52 PM 04/11/2021    1:51 PM 12/22/2020    1:57 PM 10/09/2019    3:16 PM 08/07/2017   11:18 AM  PHQ 2/9 Scores  PHQ - 2 Score 0 0 0 0 0 0 0  PHQ- 9 Score 0          Fall Risk     12/05/2023    4:25 PM 06/09/2022    2:52 PM 04/11/2021    1:49 PM 12/22/2020    1:58 PM 10/09/2019    3:15 PM  Fall Risk   Falls in the past year? 0 0 0 0 0  Number falls in past yr: 0 0 0 0 0  Injury with Fall? 0 0 0 0 0  Risk for fall due to : No Fall Risks No Fall Risks     Follow up Falls evaluation completed Falls evaluation completed;Education provided Falls prevention discussed        Data saved with a previous flowsheet row definition    MEDICARE RISK AT HOME:  Medicare Risk at Home Any stairs in or around the home?: Yes If so, are there any without handrails?: No Home free of loose throw rugs in walkways, pet  beds, electrical cords, etc?: Yes Adequate lighting in your home to reduce risk of falls?: Yes Life alert?: No Use of a cane, walker or w/c?: No Grab bars in the bathroom?: Yes Shower chair or bench in shower?: No Elevated toilet seat or a handicapped toilet?: Yes  TIMED UP AND GO:  Was the test performed?  No  Cognitive Function: 6CIT completed    08/07/2017   11:28 AM  MMSE - Mini Mental State Exam  Orientation to time 5  Orientation to Place 5  Registration 3  Attention/ Calculation 4  Recall 2  Language- name 2 objects 2  Language- repeat 1  Language- follow 3 step command 3  Language- read & follow direction 1  Write a sentence 1  Copy design 1  Total score 28        12/05/2023    4:23 PM  6CIT Screen  What Year? 0 points  What month? 0 points  What time? 0 points  Count back from 20 0 points  Months in reverse 0 points  Repeat phrase 0 points  Total Score 0 points    Immunizations Immunization History  Administered Date(s) Administered   Fluad Quad(high Dose 65+) 01/04/2021   Hep A / Hep B 03/04/2023  Influenza Split 12/30/2013, 01/22/2014   Influenza, High Dose Seasonal PF 02/26/2017, 03/19/2018   Influenza,inj,Quad PF,6+ Mos 01/22/2014   Influenza-Unspecified 12/30/2013, 01/22/2014, 12/26/2021, 12/15/2022   Moderna Covid-19 Fall Seasonal Vaccine 46yrs & older 01/21/2022, 03/04/2023   Moderna Sars-Covid-2 Vaccination 12/03/2019, 12/31/2019   Pneumococcal Conjugate-13 01/25/2014   Pneumococcal Polysaccharide-23 02/03/2014, 02/26/2017   Respiratory Syncytial Virus Vaccine,Recomb Aduvanted(Arexvy) 12/26/2021   Td 01/04/2021   Td (Adult),5 Lf Tetanus Toxid, Preservative Free 01/04/2021   Tdap 03/04/2023   Zoster Recombinant(Shingrix) 06/07/2021    Screening Tests Health Maintenance  Topic Date Due   Hepatitis B Vaccines (2 of 3 - 19+ 3-dose series) 04/01/2023   COVID-19 Vaccine (5 - Moderna risk 2024-25 season) 09/01/2023   INFLUENZA VACCINE   11/30/2023   Colonoscopy  02/25/2024   Hepatitis C Screening  12/02/2024 (Originally 12/21/1968)   Medicare Annual Wellness (AWV)  12/04/2024   DTaP/Tdap/Td (4 - Td or Tdap) 03/03/2033   Pneumococcal Vaccine: 50+ Years  Completed   Zoster Vaccines- Shingrix  Completed   HPV VACCINES  Aged Out   Meningococcal B Vaccine  Aged Out   Lung Cancer Screening  Discontinued    Health Maintenance  Health Maintenance Due  Topic Date Due   Hepatitis B Vaccines (2 of 3 - 19+ 3-dose series) 04/01/2023   COVID-19 Vaccine (5 - Moderna risk 2024-25 season) 09/01/2023   INFLUENZA VACCINE  11/30/2023   Colonoscopy  02/25/2024   Health Maintenance Items Addressed:   Additional Screening:  Vision Screening: Recommended annual ophthalmology exams for early detection of glaucoma and other disorders of the eye. Would you like a referral to an eye doctor? No    Dental Screening: Recommended annual dental exams for proper oral hygiene  Community Resource Referral / Chronic Care Management: CRR required this visit?  No   CCM required this visit?  No   Plan:    I have personally reviewed and noted the following in the patient's chart:   Medical and social history Use of alcohol, tobacco or illicit drugs  Current medications and supplements including opioid prescriptions. Patient is not currently taking opioid prescriptions. Functional ability and status Nutritional status Physical activity Advanced directives List of other physicians Hospitalizations, surgeries, and ER visits in previous 12 months Vitals Screenings to include cognitive, depression, and falls Referrals and appointments  In addition, I have reviewed and discussed with patient certain preventive protocols, quality metrics, and best practice recommendations. A written personalized care plan for preventive services as well as general preventive health recommendations were provided to patient.   Lyle MARLA Right,  [place]   12/05/2023   After Visit Summary: (MyChart) Due to this being a telephonic visit, the after visit summary with patients personalized plan was offered to patient via MyChart   Notes: Nothing significant to report at this time.

## 2023-12-05 NOTE — Patient Instructions (Signed)
 Mr. Faircloth , Thank you for taking time out of your busy schedule to complete your Annual Wellness Visit with me. I enjoyed our conversation and look forward to speaking with you again next year. I, as well as your care team,  appreciate your ongoing commitment to your health goals. Please review the following plan we discussed and let me know if I can assist you in the future. Your Game plan/ To Do List    Referrals: If you haven't heard from the office you've been referred to, please reach out to them at the phone provided.   Follow up Visits: We will see or speak with you next year for your Next Medicare AWV with our clinical staff Have you seen your provider in the last 6 months (3 months if uncontrolled diabetes)? Yes  Clinician Recommendations:  Aim for 30 minutes of exercise or brisk walking, 6-8 glasses of water, and 5 servings of fruits and vegetables each day.       This is a list of the screenings recommended for you:  Health Maintenance  Topic Date Due   Medicare Annual Wellness Visit  04/11/2022   Hepatitis B Vaccine (2 of 3 - 19+ 3-dose series) 04/01/2023   COVID-19 Vaccine (5 - Moderna risk 2024-25 season) 09/01/2023   Flu Shot  11/30/2023   Colon Cancer Screening  02/25/2024   Hepatitis C Screening  12/02/2024*   DTaP/Tdap/Td vaccine (4 - Td or Tdap) 03/03/2033   Pneumococcal Vaccine for age over 31  Completed   Zoster (Shingles) Vaccine  Completed   HPV Vaccine  Aged Out   Meningitis B Vaccine  Aged Out   Screening for Lung Cancer  Discontinued  *Topic was postponed. The date shown is not the original due date.    Advanced directives: (Declined) Advance directive discussed with you today. Even though you declined this today, please call our office should you change your mind, and we can give you the proper paperwork for you to fill out. Advance Care Planning is important because it:  [x]  Makes sure you receive the medical care that is consistent with your values,  goals, and preferences  [x]  It provides guidance to your family and loved ones and reduces their decisional burden about whether or not they are making the right decisions based on your wishes.  Follow the link provided in your after visit summary or read over the paperwork we have mailed to you to help you started getting your Advance Directives in place. If you need assistance in completing these, please reach out to us  so that we can help you!  See attachments for Preventive Care and Fall Prevention Tips.

## 2023-12-06 DIAGNOSIS — R8290 Unspecified abnormal findings in urine: Secondary | ICD-10-CM | POA: Diagnosis not present

## 2023-12-06 DIAGNOSIS — C678 Malignant neoplasm of overlapping sites of bladder: Secondary | ICD-10-CM | POA: Diagnosis not present

## 2023-12-06 DIAGNOSIS — C672 Malignant neoplasm of lateral wall of bladder: Secondary | ICD-10-CM | POA: Diagnosis not present

## 2023-12-06 DIAGNOSIS — Z8551 Personal history of malignant neoplasm of bladder: Secondary | ICD-10-CM | POA: Diagnosis not present

## 2023-12-06 NOTE — Progress Notes (Signed)
 I have assessed the patient and found no significant changes since my last office note. The patient is still a candidate for the below procedure and wishes to proceed.   Surgeon: Norleen Blush, MD Assistant: Montie positioned and prepped patient as well as administered the local anesthetic documented below  Preoperative Diagnosis Bladder cancer  Postoperative Diagnosis  No recurrence of bladder cancer, open prostatic urethra s/p HoLEP  Procedure Performed  Cystoscopy.  Anesthesia   Local (5mL of 2% lidocaine  gel).   Specimen None.   Drain None.    Blood Loss: None.  Complications: None  Condition: Good  Vitals:   12/06/23 1335  BP: 130/78  Pulse: 60  Temp: 98.9 F (37.2 C)    Indications Patrick Morris is a 73 y.o. male initially seen for bladder cancer and then scheduled for cystoscopy to complete evaluation today.   Procedure  After an explanation of the risks and benefits of the procedure to the patient, informed consent was obtained. Time out was taken and all elements were addressed with procedure and patient identity confirmed. The patient was placed supine on the table. The patient's lower abdomen, pelvis, perineum, and genitalia were prepped and draped in the usual sterile surgical fashion. Xylocaine  jelly was instilled through the urethral meatus for local anesthesia. After allowing adequate time for anesthetic effect, a flexible cystoscope was navigated through the urethral meatus and into the patient's bladder. The following urethral findings were noted: Normal penile, bulbar, membranous and prostatic urethra. There was no evidence of urethral strictures or tumors.  The prostate had a resected appearance.   The bladder was filled with sterile irrigant and pancystoscopy was performed.  The following findings were noted: The bladder neck was widely patent. There were a few small diverticula and moderate trabeculation. There was no evidence of any tumors,  stones, foreign bodies, ulcerations or bleeding vessels along the anterior, posterior, left or right bladder walls. The ureteral orifices are noted to be in the usual anatomic position. The trigone was unremarkable. The cystoscope was retroflexed off the posterior wall and the area of the bladder neck and trigone examined. No abnormalities were seen. The scope was then slowly withdrawn into the urethra and again no abnormalities were noted. The patient was then escorted off of the table and released home today. There are no complications.   Findings No bladder tumors  Disposition  The patient will be released home today. They were provided post-procedural medications as per the nursing note and instructions documented in the AVS.

## 2023-12-10 ENCOUNTER — Encounter: Payer: Self-pay | Admitting: Cardiology

## 2023-12-10 ENCOUNTER — Ambulatory Visit: Attending: Cardiology | Admitting: Cardiology

## 2023-12-10 VITALS — BP 114/62 | HR 55 | Ht 66.0 in | Wt 197.6 lb

## 2023-12-10 DIAGNOSIS — I10 Essential (primary) hypertension: Secondary | ICD-10-CM | POA: Diagnosis not present

## 2023-12-10 DIAGNOSIS — I484 Atypical atrial flutter: Secondary | ICD-10-CM | POA: Diagnosis not present

## 2023-12-10 DIAGNOSIS — D6869 Other thrombophilia: Secondary | ICD-10-CM | POA: Diagnosis not present

## 2023-12-10 MED ORDER — MULTAQ 400 MG PO TABS: 400.0000 mg | ORAL_TABLET | Freq: Two times a day (BID) | ORAL | 3 refills | Status: DC

## 2023-12-10 NOTE — Patient Instructions (Addendum)
 Medication Instructions:  Your physician has recommended you make the following change in your medication:  Start multaq  400mg  twice daily  Lab Work: None ordered.  You may go to any [facility] Location for your lab work:  [facility] - [address] ([facility]) - [phone] [address]  [place] - [address]   [place]  - [address]   [place] - [address] - [address] [employer]  - [address] [place] - [address] ([facility]   If you have labs (blood work) drawn today and your tests are completely normal, you will receive your results only by: [employer] (if you have MyChart)  If you have any lab test that is abnormal or we need to change your treatment, we will call you or send a MyChart message to review the results.  Testing/Procedures: None ordered.  Follow-Up: At [facility], you and your health needs are our priority.  As part of our continuing mission to provide you with exceptional heart care, we have created designated Provider Care Teams.  These Care Teams include your primary Cardiologist (physician) and Advanced Practice Providers (APPs -  Physician Assistants and Nurse Practitioners) who all work together to provide you with the care you need, when you need it.  We recommend signing up for the patient portal called MyChart.  Sign up information is provided on this After Visit Summary.  MyChart is used to connect with patients for Virtual Visits (Telemedicine).  Patients are able to view lab/test results, encounter notes, upcoming appointments, etc.  Non-urgent messages can be sent to your provider as well.   To learn more about what you can do with MyChart, go to [email or web address].    Your next appointment:   6 months  The format for your next appointment:   In Person  Provider:   Soyla Norton, MD or one  of the following Advanced Practice Providers on your designated Care Team:   Charlies Arthur, [place] Ozell Jodie Passey, [place] Leotis Barrack, NP  Note: Remote monitoring is used to monitor your Pacemaker/ ICD from home. This monitoring reduces the number of office visits required to check your device to one time per year. It allows us  to keep an eye on the functioning of your device to ensure it is working properly.

## 2023-12-10 NOTE — Progress Notes (Signed)
  Electrophysiology Office Note:   Date:  12/10/2023  ID:  [name], DOB 1950-09-02, MRN [id number]  Primary Cardiologist: Redell Leiter, MD Primary Heart Failure: None Electrophysiologist: Natia Fahmy Gladis Norton, MD      History of Present Illness:   Patrick Morris is a 73 y.o. male with h/o atypical atrial flutter, COPD, lung cancer, former tobacco abuse, coronary atherosclerosis, hypertension, hyperlipidemia seen today for  for Electrophysiology evaluation of atrial flutter at the request of Sreedhar Madireddy.    He was previously treated with flecainide  for his atrial flutter.  He was found to have coronary calcifications and thus flecainide  was stopped.  He gets short of breath in hot weather, but otherwise feels well at rest.  We had a coronary CT that showed a calcium  score of 181.  Flecainide  has since been stopped.  Today, denies symptoms of palpitations, chest pain, dyspnea, orthopnea, PND, lower extremity edema, claudication, dizziness, presyncope, syncope, bleeding, or neurologic sequela. The patient is tolerating medications without difficulties.  He is unaware of further episodes of atrial fibrillation despite stopping flecainide .  Review of systems complete and found to be negative unless listed in HPI.   EP Information / Studies Reviewed:    EKG is ordered today. Personal review as below.  EKG Interpretation Date/Time:  Monday December 10 2023 12:16:10 EDT Ventricular Rate:  55 PR Interval:  210 QRS Duration:  106 QT Interval:  454 QTC Calculation: 434 R Axis:   9  Text Interpretation: Sinus bradycardia with 1st degree A-V block When compared with ECG of 20-Nov-2023 08:32, No significant change since last tracing Confirmed by Emmanuelle Hibbitts ([id number]) on 12/10/2023 12:25:04 PM    Risk Assessment/Calculations:    CHA2DS2-VASc Score = 3   This indicates a 3.2% annual risk of stroke. The patient's score is based upon: CHF History: 0 HTN History: 1 Diabetes History:  0 Stroke History: 0 Vascular Disease History: 1 Age Score: 1 Gender Score: 0            Physical Exam:   VS:  BP 114/62   Pulse (!) 55   Ht 5' 6 (1.676 m)   Wt 197 lb 9.6 oz (89.6 kg)   SpO2 95%   BMI 31.89 kg/m    Wt Readings from Last 3 Encounters:  12/10/23 197 lb 9.6 oz (89.6 kg)  12/05/23 196 lb (88.9 kg)  11/26/23 196 lb (88.9 kg)     GEN: Well nourished, well developed in no acute distress NECK: No JVD; No carotid bruits CARDIAC: Regular rate and rhythm, no murmurs, rubs, gallops RESPIRATORY:  Clear to auscultation without rales, wheezing or rhonchi  ABDOMEN: Soft, non-tender, non-distended EXTREMITIES:  No edema; No deformity   ASSESSMENT AND PLAN:    1.  Atypical atrial flutter: Was previously on flecainide  but this was stopped due to to an elevated coronary calcium  score.  He feels well and has not had further episodes.  He would like to avoid procedures.  Obie Kallenbach start Multaq  400 mg twice daily.  2.  Coronary artery calcifications: Calcium  score of 181.  Plan per primary cardiology.  3.  Hypertension: Well-controlled  4.  Hyperlipidemia: Continue Crestor  per primary cardiology  5.  Secondary hypercoagulable state: On Eliquis   Follow up with Dr. Norton in 3 months  Signed, Katiana Ruland Gladis Norton, MD

## 2023-12-11 ENCOUNTER — Encounter: Payer: Self-pay | Admitting: *Deleted

## 2023-12-11 NOTE — Progress Notes (Signed)
 Oncology Nurse Navigator Documentation     12/11/2023    1:00 PM  Oncology Nurse Navigator Flowsheets  Navigator Follow Up Date: 12/12/2023  Navigator Follow Up Reason: Scan Review  Navigator Location [facility]  Navigator Encounter Type Appt/Treatment Plan Review  Patient Visit Type MedOnc  Treatment Phase Follow-up  Barriers/Navigation Needs No Barriers At This Time  Interventions Coordination of Care  Acuity Level 1-No Barriers  Coordination of Care Radiology  Time Spent with Patient 15

## 2023-12-12 ENCOUNTER — Ambulatory Visit (HOSPITAL_BASED_OUTPATIENT_CLINIC_OR_DEPARTMENT_OTHER)
Admission: RE | Admit: 2023-12-12 | Discharge: 2023-12-12 | Disposition: A | Discharge status: Home / Self Care | Source: Ambulatory Visit | Attending: Hematology & Oncology | Admitting: Hematology & Oncology

## 2023-12-12 ENCOUNTER — Encounter (HOSPITAL_BASED_OUTPATIENT_CLINIC_OR_DEPARTMENT_OTHER): Payer: Self-pay

## 2023-12-12 DIAGNOSIS — C342 Malignant neoplasm of middle lobe, bronchus or lung: Secondary | ICD-10-CM | POA: Diagnosis not present

## 2023-12-12 DIAGNOSIS — C679 Malignant neoplasm of bladder, unspecified: Secondary | ICD-10-CM | POA: Diagnosis not present

## 2023-12-12 DIAGNOSIS — R918 Other nonspecific abnormal finding of lung field: Secondary | ICD-10-CM | POA: Diagnosis not present

## 2023-12-12 DIAGNOSIS — D414 Neoplasm of uncertain behavior of bladder: Secondary | ICD-10-CM | POA: Diagnosis not present

## 2023-12-12 DIAGNOSIS — K8020 Calculus of gallbladder without cholecystitis without obstruction: Secondary | ICD-10-CM | POA: Diagnosis not present

## 2023-12-12 LAB — POCT I-STAT CREATININE: Creatinine, Ser: 0.8 mg/dL (ref 0.61–1.24)

## 2023-12-12 MED ORDER — IOHEXOL 300 MG/ML  SOLN
100.0000 mL | Freq: Once | INTRAMUSCULAR | Status: AC | PRN
  Administered 2023-12-12 (×2): 100 mL via INTRAVENOUS

## 2023-12-13 ENCOUNTER — Other Ambulatory Visit: Payer: Self-pay | Admitting: Medical

## 2023-12-18 ENCOUNTER — Other Ambulatory Visit: Payer: Self-pay | Admitting: Medical

## 2023-12-19 NOTE — Telephone Encounter (Signed)
 Requesting: clonazepam  0.5mg   Contract:  03/19/23 UDS: 03/19/23 Last Visit: 10/15/23 Next Visit: None Last Refill: 11/19/23 #60 and 0RF  Please Advise

## 2023-12-19 NOTE — Telephone Encounter (Signed)
Rx refill sent to pt pharmacy 

## 2023-12-25 ENCOUNTER — Other Ambulatory Visit: Payer: Self-pay

## 2023-12-25 ENCOUNTER — Inpatient Hospital Stay: Attending: Hematology & Oncology

## 2023-12-25 ENCOUNTER — Encounter: Payer: Self-pay | Admitting: Hematology & Oncology

## 2023-12-25 ENCOUNTER — Encounter: Payer: Self-pay | Admitting: *Deleted

## 2023-12-25 ENCOUNTER — Inpatient Hospital Stay (HOSPITAL_BASED_OUTPATIENT_CLINIC_OR_DEPARTMENT_OTHER): Admitting: Hematology & Oncology

## 2023-12-25 DIAGNOSIS — Z87891 Personal history of nicotine dependence: Secondary | ICD-10-CM | POA: Insufficient documentation

## 2023-12-25 DIAGNOSIS — R3914 Feeling of incomplete bladder emptying: Secondary | ICD-10-CM | POA: Diagnosis not present

## 2023-12-25 DIAGNOSIS — C342 Malignant neoplasm of middle lobe, bronchus or lung: Secondary | ICD-10-CM | POA: Diagnosis not present

## 2023-12-25 DIAGNOSIS — Z8551 Personal history of malignant neoplasm of bladder: Secondary | ICD-10-CM | POA: Insufficient documentation

## 2023-12-25 DIAGNOSIS — D414 Neoplasm of uncertain behavior of bladder: Secondary | ICD-10-CM

## 2023-12-25 DIAGNOSIS — N401 Enlarged prostate with lower urinary tract symptoms: Secondary | ICD-10-CM | POA: Diagnosis not present

## 2023-12-25 DIAGNOSIS — Z85118 Personal history of other malignant neoplasm of bronchus and lung: Secondary | ICD-10-CM | POA: Insufficient documentation

## 2023-12-25 DIAGNOSIS — Z08 Encounter for follow-up examination after completed treatment for malignant neoplasm: Secondary | ICD-10-CM | POA: Diagnosis not present

## 2023-12-25 DIAGNOSIS — Z923 Personal history of irradiation: Secondary | ICD-10-CM | POA: Diagnosis not present

## 2023-12-25 LAB — CMP (CANCER CENTER ONLY)
ALT: 15 U/L (ref 0–44)
AST: 17 U/L (ref 15–41)
Albumin: 4.3 g/dL (ref 3.5–5.0)
Alkaline Phosphatase: 84 U/L (ref 38–126)
Anion gap: 10 (ref 5–15)
BUN: 9 mg/dL (ref 8–23)
CO2: 25 mmol/L (ref 22–32)
Calcium: 9 mg/dL (ref 8.9–10.3)
Chloride: 105 mmol/L (ref 98–111)
Creatinine: 0.74 mg/dL (ref 0.61–1.24)
GFR, Estimated: >60 mL/min (ref >60)
Glucose, Bld: 95 mg/dL (ref 70–99)
Potassium: 3.9 mmol/L (ref 3.5–5.1)
Sodium: 140 mmol/L (ref 135–145)
Total Bilirubin: 0.6 mg/dL (ref 0.0–1.2)
Total Protein: 7.1 g/dL (ref 6.5–8.1)

## 2023-12-25 LAB — CBC WITH DIFFERENTIAL (CANCER CENTER ONLY)
Abs Immature Granulocytes: 0.03 K/uL (ref 0.00–0.07)
Basophils Absolute: 0.1 K/uL (ref 0.0–0.1)
Basophils Relative: 1 %
Eosinophils Absolute: 0.6 K/uL — ABNORMAL HIGH (ref 0.0–0.5)
Eosinophils Relative: 8 %
HCT: 40.5 % (ref 39.0–52.0)
Hemoglobin: 13.7 g/dL (ref 13.0–17.0)
Immature Granulocytes: 0 %
Lymphocytes Relative: 23 %
Lymphs Abs: 1.7 K/uL (ref 0.7–4.0)
MCH: 29.7 pg (ref 26.0–34.0)
MCHC: 33.8 g/dL (ref 30.0–36.0)
MCV: 87.9 fL (ref 80.0–100.0)
Monocytes Absolute: 0.5 K/uL (ref 0.1–1.0)
Monocytes Relative: 7 %
Neutro Abs: 4.5 K/uL (ref 1.7–7.7)
Neutrophils Relative %: 61 %
Platelet Count: 250 K/uL (ref 150–400)
RBC: 4.61 MIL/uL (ref 4.22–5.81)
RDW: 13 % (ref 11.5–15.5)
WBC Count: 7.3 K/uL (ref 4.0–10.5)
nRBC: 0 % (ref 0.0–0.2)

## 2023-12-25 LAB — LACTATE DEHYDROGENASE: LDH: 157 U/L (ref 98–192)

## 2023-12-25 NOTE — Progress Notes (Signed)
 Hematology and Oncology Follow Up Visit  Patrick Morris [id number] Nov 24, 1950 73 y.o. 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  We first saw him back on 10/16/2023.  At that time, he had a past history of the bronchogenic carcinoma of the right middle lung.  This was treated with radiosurgery.  He has been seen by Urology for a superficial bladder cancer.  According to the patient's wife, he had some treatment into the bladder.  He had a recent cystoscopy a couple weeks ago that did not show any evidence of recurrence.  He does have a tumor on the right kidney.  It measured 2.8 x 2.6 cm.  We are following this.  We did do a follow-up CT scan on him.  This was done on 12/12/2023.  The CT scan did not show anything that look like malignancy.  In the right kidney, there was a lesion that measured 2.6 cm.  It was felt to be a benign hemorrhagic cyst.  Everything else looked fine.  He feels well.  He really has had no complaints.  He has had no nausea or vomiting.  He has had no problems with bowels or bladder.  He has had no fever.  He has had no bleeding.  He has had no leg swelling.  He has had no cough.  Overall, I would have to say that his performance status is ECOG 1.  Medications:  Current Outpatient Medications:    acetaminophen  (TYLENOL ) 500 MG tablet, Take 1,000 mg by mouth every 6 (six) hours as needed for mild pain or moderate pain. For pain, Disp: , Rfl:    albuterol  (VENTOLIN  HFA) 108 (90 Base) MCG/ACT inhaler, Inhale 2 puffs into the lungs every 6 (six) hours as needed for wheezing or shortness of breath., Disp: , Rfl:    alendronate  (FOSAMAX ) 35 MG tablet, Take 1 tablet (35 mg total) by mouth every 7 (seven) days.  Take with a full glass of water on an empty stomach., Disp: 4 tablet, Rfl: 11   apixaban  (ELIQUIS ) 5 MG TABS tablet, Take 5 mg by mouth 2 (two) times daily., Disp: , Rfl:    clonazePAM  (KLONOPIN ) 0.5 MG tablet, Take 1 tablet by mouth twice daily as needed for anxiety, Disp: 60 tablet, Rfl: 0   dronedarone  (MULTAQ ) 400 MG tablet, Take 1 tablet (400 mg total) by mouth 2 (two) times daily with a meal., Disp: 180 tablet, Rfl: 3   fluticasone  (FLONASE ) 50 MCG/ACT nasal spray, Use 2 spray(s) in each nostril once daily, Disp: 16 g, Rfl: 0   gabapentin  (NEURONTIN ) 100 MG capsule, Take 1 capsule (100 mg total) by mouth 3 (three) times daily., Disp: 270 capsule, Rfl: 0   metoprolol  tartrate (LOPRESSOR ) 25 MG tablet, Take 1 tablet (25 mg total) by mouth 2 (two) times daily., Disp: 180 tablet, Rfl: 2   nitroGLYCERIN  (NITROSTAT ) 0.4 MG SL tablet, Place 0.4 mg under the tongue every 5 (five) minutes as needed for chest pain., Disp: , Rfl:    rosuvastatin  (CRESTOR ) 10 MG tablet, Take 1 tablet (10 mg total) by mouth daily., Disp: 90 tablet, Rfl: 2   TRELEGY ELLIPTA 100-62.5-25 MCG/ACT AEPB, Inhale 1 puff into the lungs daily., Disp: , Rfl:  Allergies:  Allergies  Allergen Reactions   Aspirin Other (See Comments)    Acid reflux and drooling    Past Medical History, Surgical history, Social history, and Family History were reviewed and updated.  Review of Systems: Review of Systems  Constitutional: Negative.   HENT:  Negative.    Eyes: Negative.   Respiratory: Negative.    Cardiovascular: Negative.   Gastrointestinal: Negative.   Endocrine: Negative.   Genitourinary: Negative.    Musculoskeletal: Negative.   Skin: Negative.   Neurological: Negative.   Hematological: Negative.   Psychiatric/Behavioral: Negative.      Physical Exam:  Vital signs show temperature of 97.6.  Pulse 58.  Blood pressure 146/74.  Weight is 199 pounds.  Wt Readings from Last 3 Encounters:  12/10/23 197 lb 9.6 oz (89.6  kg)  12/05/23 196 lb (88.9 kg)  11/26/23 196 lb (88.9 kg)    Physical Exam Vitals reviewed.  HENT:     Head: Normocephalic and atraumatic.  Eyes:     Pupils: Pupils are equal, round, and reactive to light.  Cardiovascular:     Rate and Rhythm: Normal rate and regular rhythm.     Heart sounds: Normal heart sounds.  Pulmonary:     Effort: Pulmonary effort is normal.     Breath sounds: Normal breath sounds.  Abdominal:     General: Bowel sounds are normal.     Palpations: Abdomen is soft.  Musculoskeletal:        General: No tenderness or deformity. Normal range of motion.     Cervical back: Normal range of motion.  Lymphadenopathy:     Cervical: No cervical adenopathy.  Skin:    General: Skin is warm and dry.     Findings: No erythema or rash.  Neurological:     Mental Status: He is alert and oriented to person, place, and time.  Psychiatric:        Behavior: Behavior normal.        Thought Content: Thought content normal.        Judgment: Judgment normal.      Lab Results  Component Value Date   WBC 7.3 12/25/2023   HGB 13.7 12/25/2023   HCT 40.5 12/25/2023   MCV 87.9 12/25/2023   PLT 250 12/25/2023     Chemistry      Component Value Date/Time   NA 140 12/25/2023 1204   NA 146 (H) 01/16/2023 1024   K 3.9 12/25/2023 1204   CL 105 12/25/2023 1204   CO2 25 12/25/2023 1204   BUN 9 12/25/2023 1204   BUN 12 01/16/2023 1024   CREATININE 0.74 12/25/2023 1204   CREATININE 0.68 (L) 10/15/2023 1034      Component Value Date/Time   CALCIUM  9.0 12/25/2023 1204   ALKPHOS 84 12/25/2023 1204   AST 17 12/25/2023 1204   ALT 15 12/25/2023 1204   BILITOT 0.6 12/25/2023 1204       Impression and Plan: Patrick Morris is a very nice 73 year old white male.  He has a history of a bronchogenic carcinoma of the right lower lung.  He underwent radiosurgery for this.  He also has a superficial bladder cancer.  I am sure that both are connected by his smoking history.  For  right now, everything seems to be doing fairly well.  From my point of view, I think we probably should do another scan on him.  We can set this up and do this in early December.  I think  this would be a reasonable time for follow-up.  I will plan to see him back after he has his scan done.   Maude JONELLE Crease, MD 8/26/20251:07 PM

## 2023-12-25 NOTE — Progress Notes (Signed)
 Reviewed CT which is negative for any concerning findings. He will continue with surveillance. Will discontinue active navigation at this time.  Oncology Nurse Navigator Documentation     12/25/2023   12:30 PM  Oncology Nurse Navigator Flowsheets  Navigation Complete Date: 12/25/2023  Post Navigation: Continue to Follow Patient? No  Reason Not Navigating Patient: No Treatment, Observation Only  Navigator Location [facility]  Navigator Encounter Type Scan Review;Follow-up Appt  Patient Visit Type MedOnc  Treatment Phase Follow-up  Barriers/Navigation Needs No Barriers At This Time  Acuity Level 1-No Barriers  Time Spent with Patient 15

## 2024-01-03 DIAGNOSIS — R059 Cough, unspecified: Secondary | ICD-10-CM | POA: Diagnosis not present

## 2024-01-09 ENCOUNTER — Encounter (HOSPITAL_BASED_OUTPATIENT_CLINIC_OR_DEPARTMENT_OTHER): Payer: Self-pay

## 2024-01-09 ENCOUNTER — Emergency Department (HOSPITAL_BASED_OUTPATIENT_CLINIC_OR_DEPARTMENT_OTHER)
Admission: EM | Admit: 2024-01-09 | Discharge: 2024-01-09 | Discharge status: Against Medical Advice | Attending: Emergency Medicine | Admitting: Emergency Medicine

## 2024-01-09 ENCOUNTER — Other Ambulatory Visit: Payer: Self-pay

## 2024-01-09 ENCOUNTER — Emergency Department (HOSPITAL_BASED_OUTPATIENT_CLINIC_OR_DEPARTMENT_OTHER)

## 2024-01-09 DIAGNOSIS — R053 Chronic cough: Secondary | ICD-10-CM | POA: Insufficient documentation

## 2024-01-09 DIAGNOSIS — L720 Epidermal cyst: Secondary | ICD-10-CM | POA: Diagnosis not present

## 2024-01-09 DIAGNOSIS — Z5321 Procedure and treatment not carried out due to patient leaving prior to being seen by health care provider: Secondary | ICD-10-CM | POA: Insufficient documentation

## 2024-01-09 DIAGNOSIS — J929 Pleural plaque without asbestos: Secondary | ICD-10-CM | POA: Diagnosis not present

## 2024-01-09 DIAGNOSIS — J449 Chronic obstructive pulmonary disease, unspecified: Secondary | ICD-10-CM | POA: Insufficient documentation

## 2024-01-09 DIAGNOSIS — R058 Other specified cough: Secondary | ICD-10-CM | POA: Diagnosis not present

## 2024-01-09 NOTE — ED Triage Notes (Signed)
 Patient reports coughing up green mucous.

## 2024-01-09 NOTE — ED Triage Notes (Signed)
 Pt reports hx of COPD, diagnosed with Covid last week. Reports symptoms have been gradually improving. Breathing is still a little difficult, not quite back to normal. Chronic cough from COPD reported.  Pt reports the thing he's coming in for is a cyst in the center of his forehead that has been getting large and is painful. Area is red. Pt states it was bleeding earlier, no drainage at this time.

## 2024-01-10 DIAGNOSIS — R058 Other specified cough: Secondary | ICD-10-CM | POA: Diagnosis not present

## 2024-01-18 ENCOUNTER — Other Ambulatory Visit: Payer: Self-pay | Admitting: Medical

## 2024-01-18 DIAGNOSIS — C679 Malignant neoplasm of bladder, unspecified: Secondary | ICD-10-CM | POA: Diagnosis not present

## 2024-01-22 NOTE — Telephone Encounter (Signed)
 Pt had uds done 03-2023. So contract should have been done around that time. Go ahead and get pt to give uds and sign new contract for upcoming year. He can do nurse visit. Sent in refill toay.

## 2024-01-23 ENCOUNTER — Other Ambulatory Visit: Payer: Self-pay

## 2024-01-23 DIAGNOSIS — Z79899 Other long term (current) drug therapy: Secondary | ICD-10-CM

## 2024-01-23 NOTE — Telephone Encounter (Signed)
 Called pt and got him scheduled for lab uds and contract

## 2024-01-25 ENCOUNTER — Telehealth: Payer: Self-pay

## 2024-01-25 NOTE — Telephone Encounter (Signed)
   Pre-operative Risk Assessment    Patient Name: Patrick Morris  DOB: 1950-09-04 MRN: [id number]   Date of last office visit: 11/26/23  Dr. Liborio Date of next office visit: NA   Request for Surgical Clearance    Procedure:  Cystoscopy with blue light, bladder biopsies and fulguration, bilateral retrograde pyelograms with ureteral washing and brushing  Date of Surgery:  Clearance 02/01/24                               Surgeon:  Dr. Norleen Blush Surgeon's Group or Practice Name:  [facility] Phone number:  [phone] Fax number:  [phone]   Type of Clearance Requested:   - Medical  - Pharmacy:  Hold Apixaban  (Eliquis ) for 2 days prior   Type of Anesthesia:  Choice   Additional requests/questions:    Bonney Arlyne LITTIE Kallie   01/25/2024, 5:00 PM

## 2024-01-28 NOTE — Telephone Encounter (Signed)
Calling for update. Please advise  

## 2024-01-28 NOTE — Telephone Encounter (Signed)
 Given patient's recent office visit, left message for him to call back to complete cardiac evaluation for upcoming procedure.   Rosaline EMERSON Bane, NP-C  01/28/2024, 12:57 PM [address], [address] [place], [place] [address] Office [phone] Fax [phone]

## 2024-01-28 NOTE — Telephone Encounter (Signed)
 Patient with diagnosis of A Flutter on Eliquis  for anticoagulation.    Procedure: Cystoscopy with blue light, bladder biopsies and fulguration, bilateral retrograde pyelograms with ureteral washing and brushing  Date of procedure: 02/01/24   CHA2DS2-VASc Score = 4  This indicates a 4.8% annual risk of stroke. The patient's score is based upon: CHF History: 0 HTN History: 1 Diabetes History: 0 Stroke History: 0 Vascular Disease History: 1 Age Score: 2 Gender Score: 0     CrCl 109 ml/min Platelet count 250K  Patient has not had an Afib/aflutter ablation or Watchman within the last 3 months or DCCV within the last 30 days    Per office protocol, patient can hold Eliquis  for 2 days prior to procedure.    **This guidance is not considered finalized until pre-operative APP has relayed final recommendations.**

## 2024-01-29 NOTE — Telephone Encounter (Signed)
   Primary Cardiologist: Redell Leiter, MD  Chart reviewed as part of pre-operative protocol coverage. Given past medical history and time since last visit, based on ACC/AHA guidelines, Patrick Morris would be at acceptable risk for the planned procedure without further cardiovascular testing.   Spoke with patient to verify no change since recent stress test. He is able to achieve > 4 METS activity without concerning cardiac symptoms. His RCRI for MACE is low.     Per office protocol, patient can hold Eliquis  for 2 days prior to procedure.   I will route this recommendation to the requesting party via Epic fax function and remove from pre-op pool.  Please call with questions.  Rosaline EMERSON Bane, NP-C  01/29/2024, 10:48 AM [address], [address] [place], [place] [address] Office [phone] Fax [phone]

## 2024-01-29 NOTE — Telephone Encounter (Signed)
 Left message for patient to call back to complete cardiac evaluation for upcoming procedure

## 2024-02-01 DIAGNOSIS — Z886 Allergy status to analgesic agent status: Secondary | ICD-10-CM | POA: Diagnosis not present

## 2024-02-01 DIAGNOSIS — D090 Carcinoma in situ of bladder: Secondary | ICD-10-CM | POA: Diagnosis not present

## 2024-02-01 DIAGNOSIS — Z87891 Personal history of nicotine dependence: Secondary | ICD-10-CM | POA: Diagnosis not present

## 2024-02-01 DIAGNOSIS — I2510 Atherosclerotic heart disease of native coronary artery without angina pectoris: Secondary | ICD-10-CM | POA: Diagnosis not present

## 2024-02-01 DIAGNOSIS — Z85118 Personal history of other malignant neoplasm of bronchus and lung: Secondary | ICD-10-CM | POA: Diagnosis not present

## 2024-02-01 DIAGNOSIS — J449 Chronic obstructive pulmonary disease, unspecified: Secondary | ICD-10-CM | POA: Diagnosis not present

## 2024-02-01 DIAGNOSIS — K219 Gastro-esophageal reflux disease without esophagitis: Secondary | ICD-10-CM | POA: Diagnosis not present

## 2024-02-01 DIAGNOSIS — R58 Hemorrhage, not elsewhere classified: Secondary | ICD-10-CM | POA: Diagnosis not present

## 2024-02-01 DIAGNOSIS — Z7901 Long term (current) use of anticoagulants: Secondary | ICD-10-CM | POA: Diagnosis not present

## 2024-02-01 DIAGNOSIS — N1330 Unspecified hydronephrosis: Secondary | ICD-10-CM | POA: Diagnosis not present

## 2024-02-01 DIAGNOSIS — N2889 Other specified disorders of kidney and ureter: Secondary | ICD-10-CM | POA: Diagnosis not present

## 2024-02-01 DIAGNOSIS — Z79899 Other long term (current) drug therapy: Secondary | ICD-10-CM | POA: Diagnosis not present

## 2024-02-01 DIAGNOSIS — C679 Malignant neoplasm of bladder, unspecified: Secondary | ICD-10-CM | POA: Diagnosis not present

## 2024-02-01 DIAGNOSIS — N1339 Other hydronephrosis: Secondary | ICD-10-CM | POA: Diagnosis not present

## 2024-02-01 DIAGNOSIS — K920 Hematemesis: Secondary | ICD-10-CM | POA: Diagnosis not present

## 2024-02-01 DIAGNOSIS — R10A3 Flank pain, bilateral: Secondary | ICD-10-CM | POA: Diagnosis not present

## 2024-02-01 DIAGNOSIS — I4891 Unspecified atrial fibrillation: Secondary | ICD-10-CM | POA: Diagnosis not present

## 2024-02-01 DIAGNOSIS — N3090 Cystitis, unspecified without hematuria: Secondary | ICD-10-CM | POA: Diagnosis not present

## 2024-02-01 DIAGNOSIS — R339 Retention of urine, unspecified: Secondary | ICD-10-CM | POA: Diagnosis not present

## 2024-02-01 DIAGNOSIS — R1030 Lower abdominal pain, unspecified: Secondary | ICD-10-CM | POA: Diagnosis not present

## 2024-02-01 DIAGNOSIS — Z8551 Personal history of malignant neoplasm of bladder: Secondary | ICD-10-CM | POA: Diagnosis not present

## 2024-02-01 DIAGNOSIS — Z743 Need for continuous supervision: Secondary | ICD-10-CM | POA: Diagnosis not present

## 2024-02-01 DIAGNOSIS — R6889 Other general symptoms and signs: Secondary | ICD-10-CM | POA: Diagnosis not present

## 2024-02-01 DIAGNOSIS — M549 Dorsalgia, unspecified: Secondary | ICD-10-CM | POA: Diagnosis not present

## 2024-02-01 DIAGNOSIS — D494 Neoplasm of unspecified behavior of bladder: Secondary | ICD-10-CM | POA: Diagnosis not present

## 2024-02-01 DIAGNOSIS — R109 Unspecified abdominal pain: Secondary | ICD-10-CM | POA: Diagnosis not present

## 2024-02-01 DIAGNOSIS — R100 Acute abdomen: Secondary | ICD-10-CM | POA: Diagnosis not present

## 2024-02-02 DIAGNOSIS — R100 Acute abdomen: Secondary | ICD-10-CM | POA: Diagnosis not present

## 2024-02-02 DIAGNOSIS — N1339 Other hydronephrosis: Secondary | ICD-10-CM | POA: Diagnosis not present

## 2024-02-02 DIAGNOSIS — K920 Hematemesis: Secondary | ICD-10-CM | POA: Diagnosis not present

## 2024-02-02 LAB — LAB REPORT - SCANNED: EGFR: 86

## 2024-02-04 ENCOUNTER — Other Ambulatory Visit

## 2024-02-06 DIAGNOSIS — C679 Malignant neoplasm of bladder, unspecified: Secondary | ICD-10-CM | POA: Diagnosis not present

## 2024-02-06 DIAGNOSIS — C672 Malignant neoplasm of lateral wall of bladder: Secondary | ICD-10-CM | POA: Diagnosis not present

## 2024-02-07 DIAGNOSIS — Z466 Encounter for fitting and adjustment of urinary device: Secondary | ICD-10-CM | POA: Diagnosis not present

## 2024-02-09 ENCOUNTER — Other Ambulatory Visit: Payer: Self-pay | Admitting: Medical

## 2024-02-11 ENCOUNTER — Other Ambulatory Visit

## 2024-02-11 ENCOUNTER — Telehealth: Payer: Self-pay

## 2024-02-11 DIAGNOSIS — Z79899 Other long term (current) drug therapy: Secondary | ICD-10-CM | POA: Diagnosis not present

## 2024-02-11 NOTE — Addendum Note (Signed)
 Addended by: DORLENE CHIQUITA RAMAN on: 02/11/2024 03:57 PM   Modules accepted: Orders

## 2024-02-11 NOTE — Telephone Encounter (Signed)
 Medication added to med list.

## 2024-02-11 NOTE — Telephone Encounter (Signed)
 Patient came in today for urine drug screen , wife brung in medication bottle for oxycodone  5-325 mg in case this medication showed up on the drug screen panel qty 20 prescribed by surgeon Dr.John Tanda, MD .. Surgery was the first of October

## 2024-02-12 DIAGNOSIS — K449 Diaphragmatic hernia without obstruction or gangrene: Secondary | ICD-10-CM | POA: Diagnosis not present

## 2024-02-12 DIAGNOSIS — K429 Umbilical hernia without obstruction or gangrene: Secondary | ICD-10-CM | POA: Diagnosis not present

## 2024-02-12 DIAGNOSIS — K8020 Calculus of gallbladder without cholecystitis without obstruction: Secondary | ICD-10-CM | POA: Diagnosis not present

## 2024-02-12 DIAGNOSIS — C679 Malignant neoplasm of bladder, unspecified: Secondary | ICD-10-CM | POA: Diagnosis not present

## 2024-02-12 DIAGNOSIS — C678 Malignant neoplasm of overlapping sites of bladder: Secondary | ICD-10-CM | POA: Diagnosis not present

## 2024-02-12 DIAGNOSIS — R911 Solitary pulmonary nodule: Secondary | ICD-10-CM | POA: Diagnosis not present

## 2024-02-12 DIAGNOSIS — N1330 Unspecified hydronephrosis: Secondary | ICD-10-CM | POA: Diagnosis not present

## 2024-02-12 DIAGNOSIS — M47819 Spondylosis without myelopathy or radiculopathy, site unspecified: Secondary | ICD-10-CM | POA: Diagnosis not present

## 2024-02-13 ENCOUNTER — Ambulatory Visit: Payer: Self-pay | Admitting: Medical

## 2024-02-13 LAB — DRUG MONITORING PANEL 376104, URINE
Alphahydroxyalprazolam: NEGATIVE ng/mL (ref <25)
Alphahydroxymidazolam: NEGATIVE ng/mL (ref <50)
Alphahydroxytriazolam: NEGATIVE ng/mL (ref <50)
Aminoclonazepam: 247 ng/mL — ABNORMAL HIGH (ref <25)
Amphetamines: NEGATIVE ng/mL (ref <500)
Barbiturates: NEGATIVE ng/mL (ref <300)
Benzodiazepines: POSITIVE ng/mL — AB (ref <100)
Cocaine Metabolite: NEGATIVE ng/mL (ref <150)
Desmethyltramadol: NEGATIVE ng/mL (ref <100)
Hydroxyethylflurazepam: NEGATIVE ng/mL (ref <50)
Lorazepam: NEGATIVE ng/mL (ref <50)
Nordiazepam: NEGATIVE ng/mL (ref <50)
Opiates: NEGATIVE ng/mL (ref <100)
Oxazepam: NEGATIVE ng/mL (ref <50)
Oxycodone: NEGATIVE ng/mL (ref <100)
Temazepam: NEGATIVE ng/mL (ref <50)
Tramadol: NEGATIVE ng/mL (ref <100)

## 2024-02-13 LAB — DM TEMPLATE

## 2024-02-14 DIAGNOSIS — N323 Diverticulum of bladder: Secondary | ICD-10-CM | POA: Diagnosis not present

## 2024-02-22 ENCOUNTER — Other Ambulatory Visit: Payer: Self-pay | Admitting: Cardiology

## 2024-02-22 DIAGNOSIS — I484 Atypical atrial flutter: Secondary | ICD-10-CM

## 2024-02-25 NOTE — Telephone Encounter (Signed)
 Eliquis  5mg  refill request received. Patient is 73 years old, weight-89.4kg, Crea-0.74 on 12/25/23, Diagnosis-Aflutter, and last seen by Dr. Inocencio on 12/10/23. Dose is appropriate based on dosing criteria. Will send in refill to requested pharmacy.

## 2024-02-26 ENCOUNTER — Other Ambulatory Visit: Payer: Self-pay | Admitting: Medical

## 2024-02-26 NOTE — Telephone Encounter (Signed)
Rx refill sent to pt pharmacy 

## 2024-02-26 NOTE — Telephone Encounter (Signed)
 Requesting: clonazepam  0.5mg  Contract: 02/11/24 UDS: 02/11/24 Last Visit: 10/15/23 Next Visit: None Last Refill: 01/22/24 #60 and 0RF   Please Advise

## 2024-02-27 DIAGNOSIS — R911 Solitary pulmonary nodule: Secondary | ICD-10-CM | POA: Diagnosis not present

## 2024-02-27 DIAGNOSIS — J432 Centrilobular emphysema: Secondary | ICD-10-CM | POA: Diagnosis not present

## 2024-02-27 DIAGNOSIS — Z87891 Personal history of nicotine dependence: Secondary | ICD-10-CM | POA: Diagnosis not present

## 2024-02-27 DIAGNOSIS — C342 Malignant neoplasm of middle lobe, bronchus or lung: Secondary | ICD-10-CM | POA: Diagnosis not present

## 2024-03-20 NOTE — ED Provider Notes (Signed)
 Patient placed in [facility] pathway, seen and evaluated for chief complaint of finger injury.  L index finger was crushed by hood of truck.  Believes he is UTD on tetanus.  Pt has been off eliquis  for one week.  Pertinent exam findings include NAD.   Patient/parent counseled on process, plan, and necessity for staying for completing the evaluation.      Note By: Peyton Gammons, PA-C 1:42 PM   Emergency Department Provider Note  This document was created using the aid of voice recognition Dragon dictation software.  History   Chief Complaint  Patient presents with  . Laceration     HPI  Patrick Morris is a 73 y.o. male who presents to the ED with complaints of laceration to the index finger on the left hand. Patient reports his finger was crushed under car hood. Unknown Tdap.    Past Medical History   Medical History[1]  Physical Exam   Vitals:   03/20/24 1337  BP: 134/85  BP Location: Right arm  Patient Position: Sitting  Pulse: 96  Resp: 16  Temp: 97.9 F (36.6 C)  TempSrc: Oral  SpO2: 94%  Weight: 86.6 kg (191 lb)  Height: 170.2 cm (5' 7)    Physical Exam Constitutional:      Appearance: Normal appearance. He is normal weight.  HENT:     Head: Normocephalic and atraumatic.     Nose: Nose normal.  Eyes:     Extraocular Movements: Extraocular movements intact.  Cardiovascular:     Rate and Rhythm: Normal rate.  Pulmonary:     Effort: Pulmonary effort is normal.     Breath sounds: Normal breath sounds.  Abdominal:     Palpations: Abdomen is soft.  Musculoskeletal:        General: Normal range of motion.     Cervical back: Normal range of motion.  Skin:    General: Skin is warm and dry.     Findings: Laceration present.     Comments: Neurovascularly intact left index finger  Neurological:     General: No focal deficit present.     Mental Status: He is alert and oriented to person, place, and time.     Procedure Note  Laceration  repair  Date/Time: 03/20/2024 1:34 PM  Performed by: Toribio Kava Day, PA-C Authorized by: Dorn Arloa Hoover, MD   Consent:    Consent obtained:  Verbal   Consent given by:  Patient   Risks, benefits, and alternatives were discussed: yes     Risks discussed:  Infection, pain, poor wound healing, poor cosmetic result and need for additional repair Universal protocol:    Procedure explained and questions answered to patient or proxy's satisfaction: yes     Imaging studies available: yes     Patient identity confirmed:  Verbally with patient and arm band Anesthesia:    Anesthesia method:  Local infiltration and nerve block   Block location:  Left index finger   Block needle gauge:  25 G   Block anesthetic:  Lidocaine  1% w/o epi   Block technique:  Circumfrential   Block injection procedure:  Anatomic landmarks identified, anatomic landmarks palpated, introduced needle and negative aspiration for blood   Block outcome:  Anesthesia achieved Laceration details:    Location:  Finger   Finger location:  L index finger   Length (cm):  2   Depth (mm):  3 Pre-procedure details:    Preparation:  Patient was prepped and draped in usual  sterile fashion Exploration:    Hemostasis achieved with:  Direct pressure   Imaging obtained: x-ray     Imaging outcome: foreign body not noted     Contaminated: no   Treatment:    Area cleansed with:  Povidone-iodine   Amount of cleaning:  Standard   Irrigation solution:  Sterile water   Irrigation volume:  50   Irrigation method:  Pressure wash   Debridement:  None   Undermining:  None   Scar revision: no   Skin repair:    Repair method:  Sutures   Suture size:  4-0   Suture material:  Nylon   Suture technique:  Simple interrupted   Number of sutures:  5 Approximation:    Approximation:  Close Repair type:    Repair type:  Simple Post-procedure details:    Dressing:  Antibiotic ointment and non-adherent dressing   Procedure  completion:  Tolerated well, no immediate complications Comments:     Repaired by Dr. Arlana, PGY1. I evaluated lac repair and feel adequate closure was performed.    ED Clinical Impression   1. Laceration of left index finger without foreign body without damage to nail, initial encounter    ED Assessment / Plan   ED Course as of 03/20/24 1455  Thu Mar 20, 2024  [id number] 73 year old male presents to the ED today for finger injury.  VSS.  Initial presentation patient is resting comfortably in no obvious distress.  Physical exam as above.  X-ray of the left hand shows no acute fracture to the index finger.  Tdap updated in the ED.  Laceration repaired as above by Dr. Arlana PGY1. Bacitracin applied and wound bandaged. Patient stable for discharge. [DD]    ED Course User Index [DD] Toribio Kava Day, PA-C   Upon reassessing patient, patient was resting comfortably. Based on the above findings, I believe patient is hemodynamically stable for discharge.  Patient/and family educated about specific return precautions for given chief complaint and symptoms.  Patient/and family educated about follow-up with PCP.  Patient/and family expressed understanding of return precautions and need for follow-up.  Patient discharged.   Clinical Complexity  Patient's presentation is most consistent with acute presentation with potential threat to life or bodily function.  Decision to admit or increased level of care requiring transfer: No: Evaluation and diagnostic workup in ED does not suggest an emergent condition requiring admission or immediate observation beyond what has been performed at this time. The patient is safe for discharge and has been instructed to return for worsening symptoms, change in symptoms, or any other concerns.  n/a  OTC medications advised and discussed with patient include Motrin and Tylenol .  Prescription medications discussed: none  Pt presentation is complicated by their  history of Cancer, resulting in increased risk of frequent ED usage  Discussed options for workup with patient including risks and benefits via shared decision making and decided to proceed with workup.  Evidence based calculators if applicable:      Medical Decision Making   Differentials considered: DDX: strain, sprain, fracture, contusion, dislocation, hematoma, bursitis   Clinical Complexity Number and complexity of problems addressed: Medical Decision Making Problems Addressed: Laceration of left index finger without foreign body without damage to nail, initial encounter: complicated acute illness or injury  Amount and/or Complexity of Data Reviewed External Data Reviewed: notes.    Details: 03/19/2024 urology office visit note reviewed for malignant neoplasm of the bladder Radiology: ordered and independent interpretation performed. Decision-making details documented  in ED Course.  Risk OTC drugs. Prescription drug management.    I considered ordering CT L Hand, however, after further ED work up and history I felt this was unnecessary.  All radiology studies reviewed independently, additionally reading provided by radiologist available, unless otherwise noted.   Emergency Department Medication Summary: Medications  bacitracin ointment tube 1 Application (has no administration in time range)  diphtheria-pertussis-tetanus (BOOSTRIX, Tdap) vaccine 0.5 mL (0.5 mL intramuscular Given 03/20/24 1432)  acetaminophen  (TYLENOL ) tablet 1,000 mg (1,000 mg oral Given 03/20/24 1428)    ED Disposition: ED Disposition     None            [1] Past Medical History: Diagnosis Date  . Anxiety   . Arthritis    knees and back  . BPH (benign prostatic hyperplasia)   . Emphysema of lung (CMD)    no O2  . Neuropathy

## 2024-03-24 ENCOUNTER — Ambulatory Visit: Payer: Self-pay

## 2024-03-24 ENCOUNTER — Ambulatory Visit: Admitting: Family Medicine

## 2024-03-24 ENCOUNTER — Other Ambulatory Visit: Payer: Self-pay | Admitting: Medical

## 2024-03-24 ENCOUNTER — Encounter: Payer: Self-pay | Admitting: Family Medicine

## 2024-03-24 VITALS — BP 132/78 | HR 62 | Temp 98.0°F | Resp 16 | Ht 66.0 in | Wt 193.4 lb

## 2024-03-24 DIAGNOSIS — S61412A Laceration without foreign body of left hand, initial encounter: Secondary | ICD-10-CM

## 2024-03-24 MED ORDER — DOXYCYCLINE HYCLATE 100 MG PO TABS: 100.0000 mg | ORAL_TABLET | Freq: Two times a day (BID) | ORAL | 0 refills | Status: AC

## 2024-03-24 MED ORDER — OXYCODONE HCL 5 MG PO TABS: 5.0000 mg | ORAL_TABLET | ORAL | 0 refills | Status: DC | PRN

## 2024-03-24 NOTE — Telephone Encounter (Signed)
 Pt has scheduled OV.

## 2024-03-24 NOTE — Progress Notes (Signed)
 Chief Complaint  Patient presents with   Nail Problem    Nail Problem    Patrick Morris is a 73 y.o. male here for a skin complaint.  Duration: 4 days Location: L index finger Pruritic? No Painful? Yes Drainage? No Slammed finger while working on a truck. Given tetanus shot in the ER.  Other associated symptoms: swelling, pain, redness,getting worse.  Therapies tried thus far: TAO bid  Past Medical History:  Diagnosis Date   Atypical atrial flutter 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   Cataract    Cellulitis of foot 01/04/2021   Chronic anticoagulation    Colitis    COPD (chronic obstructive pulmonary disease) (HCC)    COPD exacerbation (HCC) 01/22/2014   Coronary artery calcification    Cough 01/22/2014   Dyspnea on exertion 01/11/2023   Ear infection    GERD (gastroesophageal reflux disease)    Hard of hearing    Hypertension 08/23/2023   Laceration of left hand without foreign body 01/04/2021   Lung cancer, middle lobe (HCC) 10/16/2023   Mixed dyslipidemia 01/11/2023   Nodule of middle lobe of right lung 07/29/2020   Added automatically from request for surgery [id number]     Pain in left foot 09/20/2016   Pain in left leg 09/20/2016   Pneumothorax 08/10/2020   Pneumothorax, right 08/10/2020   Salmonella    Sinus infection    Skin lesion 01/22/2014   Wellness examination 02/24/2014    BP 132/78 (BP Location: Left Arm, Patient Position: Sitting)   Pulse 62   Temp 98 F (36.7 C) (Oral)   Resp 16   Ht 5' 6 (1.676 m)   Wt 193 lb 6.4 oz (87.7 kg)   SpO2 98%   BMI 31.22 kg/m  Gen: awake, alert, appearing stated age Lungs: No accessory muscle use Skin: See below. Slight erythema, ttp, edema. No drainage, fluctuance, scaling Psych: Age appropriate judgment and insight    Laceration of left hand without foreign body, initial encounter  Given  worsening swelling and pain, will treat with both oxycodone  and 7 days of doxycycline .  He will schedule appointment this coming business week to have his sutures removed. Warning signs and symptoms verbalized and written down in AVS.  F/u prn. The patient voiced understanding and agreement to the plan.  Mabel [place], DO 03/24/24 1:27 PM

## 2024-03-24 NOTE — Telephone Encounter (Signed)
 FYI Only or Action Required?: FYI only for provider: appointment scheduled on 03/24/24.  Patient was last seen in primary care on 10/15/2023 by Dorina Loving, PA-C.  Called Nurse Triage reporting Hand Injury.  Symptoms began several days ago.  Interventions attempted: Rest, hydration, or home remedies and Other: Sutured day of accident at ED, pt having sever acute pain, worsening swelling/redness.  Symptoms are: gradually worsening.  Triage Disposition: See HCP Within 4 Hours (Or PCP Triage)  Patient/caregiver understands and will follow disposition?: Yes   Copied from CRM [id number]. Topic: Clinical - Red Word Triage >> Mar 24, 2024 10:19 AM Pinkey ORN wrote: Red Word that prompted transfer to Nurse Triage: Swelling >> Mar 24, 2024 10:20 AM Pinkey ORN wrote: Erminio wife states patient is experiencing some swelling in his left hand and it's moving into his arm.  Reason for Disposition  [1] SEVERE pain (e.g., excruciating, unable to use hand at all) AND [2] not improved 2 hours after pain medicine/ice packs  Answer Assessment - Initial Assessment Questions 1. MECHANISM: How did the injury happen?     Truck hood fell on hand 2. ONSET: When did the injury happen? (e.g., minutes, hours ago)      Thursday 3. APPEARANCE of INJURY: What does the injury look like?      Red, swollen 4. SEVERITY: Can you use your hand normally? Can you bend your fingers into a ball and then fully open them?     Unable to move finger 5. SIZE: For cuts, bruises, or swelling, ask: How large is it? (e.g., inches or centimeters; entire hand)      Index knuckle cut open, top of hand 6. PAIN: How bad is the pain? (Scale 0-10; or none, mild, moderate, severe)     Severe 7. TETANUS: For any breaks in the skin, ask: When was your last tetanus booster?     Was given tetanus booster 8. OTHER SYMPTOMS: Do you have any other symptoms?      Swelling, redness  Protocols used: Hand  Injury-A-AH

## 2024-03-24 NOTE — Patient Instructions (Signed)
 Ice/cold pack over area for 10-15 min twice daily.  OK to take Tylenol  1000 mg (2 extra strength tabs) or 975 mg (3 regular strength tabs) every 6 hours as needed.  Do not drink alcohol, do any illicit/street drugs, drive or do anything that requires alertness while on this medicine.   Let us  know if you need anything.

## 2024-03-24 NOTE — Telephone Encounter (Signed)
 Requesting: clonazepam  0.5mg   Contract: 02/11/24 UDS: 02/11/24 Last Visit: 10/15/23 Next Visit: 03/24/24 w/ Dr. Frann Last Refill: 02/26/24 #60 and 0RF   Please Advise

## 2024-03-25 NOTE — Telephone Encounter (Signed)
 Rx refill sent to pharmacy.

## 2024-03-31 ENCOUNTER — Ambulatory Visit: Admitting: Medical

## 2024-03-31 VITALS — BP 128/76 | HR 68 | Temp 97.7°F | Resp 15 | Ht 66.0 in | Wt 190.8 lb

## 2024-03-31 DIAGNOSIS — Z4802 Encounter for removal of sutures: Secondary | ICD-10-CM

## 2024-03-31 MED ORDER — DOXYCYCLINE HYCLATE 100 MG PO TABS: 100.0000 mg | ORAL_TABLET | Freq: Two times a day (BID) | ORAL | 0 refills | Status: DC

## 2024-03-31 NOTE — Progress Notes (Signed)
° °  Subjective:    Patient ID: Patrick Morris, male    DOB: Feb 24, 1951, 73 y.o.   MRN: [id number]  HPI  Patrick Morris is a 73 year old male who presents for follow-up after a finger injury with suture placement. On 03-20-2024.  He injured his index finger when the truck hood fell and trapped it, causing significant swelling and a laceration that required repair in the emergency department with about five sutures.  The sutures were due for removal at seven to ten days and it has now been eleven days. He thinks one suture fell out and there is a small residual opening, with swelling and scabbing but no active bleeding.   On 03-25-2023 seen for swelling and redness. Dr. Frann saw pt. He was prescribed a seven-day course of doxycycline  and oxycodone  for pain. He uses oxycodone  as needed and has one to two doses of doxycycline  left.  He had bladder surgery over a month ago and Eliquis  was stopped afterward due to post-surgical bleeding. He has not yet resumed Eliquis .   Review of Systems See hpi.    Objective:   Physical Exam  General- No acute distress. Pleasant patient. Neck- Full range of motion, no jvd Lungs- Clear, even and unlabored. Heart- regular rate and rhythm. Neurologic- CNII- XII grossly intact.  Left index finger- pip joint 1.5 cm laceration line. 5 sutures placed. Faint pink color tone to wound. No dc. No redness. Well healed except slight mid section not completley apposed.      Assessment & Plan:   Laceration of right index finger, suture removal today and wound management Wound not fully closed(but suture not helping at this point and skin overgrowing sutures), scabbing present, mild delayed healing possibly due to previous antibiotic use and joint movement. -remove all 5 sutures - Applied three SteriStrips over wound. - Wrapped joint and finger with Coban to immobilize. - Advised to keep finger straight for 3-5 more days. - Scheduled follow-up in 10 days to  assess healing and range of motion.  Suspected/resolving wound infection of right index finger Suspected residual infection, previously treated with doxycycline . Infection may have delayed healing. - Prescribed additional 3 days of doxycycline . - Advised to monitor for signs of infection and return if he occurs.   Anticoagulation management for atrial fibrillation Anticoagulation stopped post-bladder surgery due to bleeding. Off anticoagulation for over a month. Discussed resuming anticoagulation considering bleeding risk versus need for atrial fibrillation management. - Advised to contact cardiologist to verify safety of resuming anticoagulation.( I think benefit exceeds risk since area not bleeding and past bladder surgery. - Monitor for signs of bleeding if anticoagulation is resumed.  Follow up 10 days or sooner if needed

## 2024-03-31 NOTE — Patient Instructions (Signed)
 Laceration of right index finger, suture removal today and wound management Wound not fully closed(but suture not helping at this point and skin overgrowing sutures), scabbing present, mild delayed healing possibly due to previous antibiotic use and joint movement. -remove all 5 sutures - Applied three SteriStrips over wound. - Wrapped joint and finger with Coban to immobilize. - Advised to keep finger straight for 3-5 more days. - Scheduled follow-up in 10 days to assess healing and range of motion.  Suspected/resolving wound infection of right index finger Suspected residual infection, previously treated with doxycycline . Infection may have delayed healing. - Prescribed additional 3 days of doxycycline . - Advised to monitor for signs of infection and return if he occurs.   Anticoagulation management for atrial fibrillation Anticoagulation stopped post-bladder surgery due to bleeding. Off anticoagulation for over a month. Discussed resuming anticoagulation considering bleeding risk versus need for atrial fibrillation management. - Advised to contact cardiologist to verify safety of resuming anticoagulation.( I think benefit exceeds risk since area not bleeding and past bladder surgery. - Monitor for signs of bleeding if anticoagulation is resumed.  Follow up 10 days or sooner if needed

## 2024-04-01 ENCOUNTER — Ambulatory Visit (HOSPITAL_BASED_OUTPATIENT_CLINIC_OR_DEPARTMENT_OTHER)
Admission: RE | Admit: 2024-04-01 | Discharge: 2024-04-01 | Attending: Hematology & Oncology | Admitting: Hematology & Oncology

## 2024-04-01 ENCOUNTER — Other Ambulatory Visit: Payer: Self-pay | Admitting: Medical

## 2024-04-01 ENCOUNTER — Encounter (HOSPITAL_BASED_OUTPATIENT_CLINIC_OR_DEPARTMENT_OTHER): Payer: Self-pay

## 2024-04-01 DIAGNOSIS — C342 Malignant neoplasm of middle lobe, bronchus or lung: Secondary | ICD-10-CM | POA: Insufficient documentation

## 2024-04-01 DIAGNOSIS — R3914 Feeling of incomplete bladder emptying: Secondary | ICD-10-CM | POA: Insufficient documentation

## 2024-04-01 DIAGNOSIS — N401 Enlarged prostate with lower urinary tract symptoms: Secondary | ICD-10-CM | POA: Insufficient documentation

## 2024-04-01 MED ORDER — IOHEXOL 300 MG/ML  SOLN
100.0000 mL | Freq: Once | INTRAMUSCULAR | Status: AC | PRN
  Administered 2024-04-01: 100 mL via INTRAVENOUS

## 2024-04-08 NOTE — Nursing Note (Signed)
 Pt arrived to [facility] center for BCG bladder instillation day 1 cycle 1. Pt verbalizes understanding of today's treatment. Urine sent to lab and consent obtained. 14 Fr foley place under sterile field with lidocaine  and tolerated well. BCG instilled for 2 hours. Bladder drained and foley removed. Pyridium given and no distress noted upon ambulatory discharge.

## 2024-04-10 ENCOUNTER — Ambulatory Visit: Admitting: Medical

## 2024-04-11 ENCOUNTER — Ambulatory Visit: Admitting: Medical

## 2024-04-11 ENCOUNTER — Ambulatory Visit (HOSPITAL_BASED_OUTPATIENT_CLINIC_OR_DEPARTMENT_OTHER)
Admission: RE | Admit: 2024-04-11 | Discharge: 2024-04-11 | Disposition: A | Source: Ambulatory Visit | Attending: Medical | Admitting: Medical

## 2024-04-11 VITALS — BP 120/76 | HR 56 | Temp 97.9°F | Resp 14 | Ht 66.0 in | Wt 191.0 lb

## 2024-04-11 DIAGNOSIS — L089 Local infection of the skin and subcutaneous tissue, unspecified: Secondary | ICD-10-CM

## 2024-04-11 DIAGNOSIS — M79645 Pain in left finger(s): Secondary | ICD-10-CM

## 2024-04-11 DIAGNOSIS — T148XXD Other injury of unspecified body region, subsequent encounter: Secondary | ICD-10-CM

## 2024-04-11 MED ORDER — DOXYCYCLINE HYCLATE 100 MG PO TABS: 100.0000 mg | ORAL_TABLET | Freq: Two times a day (BID) | ORAL | 0 refills | Status: DC

## 2024-04-11 NOTE — Progress Notes (Signed)
° °  Subjective:    Patient ID: Patrick Morris, male    DOB: [birth year], 73 y.o.   MRN: [id number]  HPI ANTONIO CRESWELL is a 73 year old male with aggressive bladder cancer who presents with a non-healing wound on the left index finger.  He has had a non-healing wound on the left index finger for about three weeks. It was sutured initially, then reopened with movement despite buddy tape and a finger splint. He has swelling and throbbing pain but no yellow discharge or crusting. He avoids bending the finger to prevent re-splitting.  He completed a course of doxycycline  several days ago. He still has pain and occasionally takes 5 mg pain pills prescribed for another condition, with partial relief.  He has aggressive bladder cancer on active treatment. He takes Eliquis , which was held around his surgery and restarted a few days afterward.   Review of Systems See hpi    Objective:   Physical Exam  General- No acute distress. Pleasant patient. Neck- Full range of motion, no jvd Lungs- Clear, even and unlabored. Heart- regular rate and rhythm. Neurologic- CNII- XII grossly intact.  Left index finger- pip joint lacerations that appear scabbed and healed but finger is stiff and mild swollen. Only mild partial flexion    Assessment & Plan:   Slow/delayed wound healing of the left index finger. Suspect infection playing role. Wound remains unhealed with swelling and pain three weeks post-injury. Concerns for infection or tendon injury. Previous doxycycline  course completed. - Ordered x-ray of left index finger. - Sent wound culture for bacterial identification. - Prescribed doxycycline  for three days pending culture results. - Referred to hand specialist for further evaluation. - Advised to keep finger straight and avoid bending.  Chronic anticoagulation therapy On Eliquis  for anticoagulation, no interaction with doxycycline . - Continue Eliquis  as prescribed.  Follow up next week wed with me  if not able to get you in with hand specialist next week.  Lazaria Schaben, PA-C

## 2024-04-11 NOTE — Patient Instructions (Signed)
 Slow/delayed wound healing of the left index finger. Suspect infection playing role. Wound remains unhealed with swelling and pain three weeks post-injury. Concerns for infection or tendon injury. Previous doxycycline  course completed. - Ordered x-ray of left index finger. - Sent wound culture for bacterial identification. - Prescribed doxycycline  for three days pending culture results. - Referred to hand specialist for further evaluation. - Advised to keep finger straight and avoid bending.  Chronic anticoagulation therapy On Eliquis  for anticoagulation, no interaction with doxycycline . - Continue Eliquis  as prescribed.  Follow up next week wed with me if not able to get you in with hand specialist next week.

## 2024-04-12 ENCOUNTER — Ambulatory Visit: Payer: Self-pay | Admitting: Medical

## 2024-04-14 LAB — CULTURE,AEROBIC BACTERIA WITH GRAM STAIN
MICRO NUMBER:: 17349388
SPECIMEN QUALITY:: ADEQUATE

## 2024-04-14 NOTE — Progress Notes (Signed)
Pt called and aware

## 2024-04-15 MED ORDER — AMOXICILLIN-POT CLAVULANATE 875-125 MG PO TABS: 1.0000 | ORAL_TABLET | Freq: Two times a day (BID) | ORAL | 0 refills | Status: DC

## 2024-04-15 NOTE — Addendum Note (Signed)
 Addended by: DORINA DALLAS HERO on: 04/15/2024 05:33 AM   Modules accepted: Orders

## 2024-04-16 ENCOUNTER — Ambulatory Visit: Admitting: Orthopaedic Surgery

## 2024-04-16 DIAGNOSIS — S61211A Laceration without foreign body of left index finger without damage to nail, initial encounter: Secondary | ICD-10-CM | POA: Diagnosis not present

## 2024-04-16 NOTE — Progress Notes (Signed)
 Office Visit Note   Patient: Patrick Morris           Date of Birth: February 03, 1951           MRN: [id number] Visit Date: 04/16/2024              Requested by: Dorina Loving, PA-C [id number] [place],  [place] [address] PCP: Dorina Loving, PA-C   Assessment & Plan: Visit Diagnoses:  1. Laceration of left index finger without foreign body without damage to nail, initial encounter     Plan: History of Present Illness LESLIE LANGILLE is a 73 year old male who presents with a swollen left index finger following a laceration three weeks ago.  Three weeks ago he sustained a deep laceration over the knuckle of the left index finger with immediate significant swelling. The wound has healed but swelling has persisted. He started Augmentin  yesterday for a 10-day course, twice daily by PCP. He takes Eliquis , which he recently held due to bleeding after bladder cancer surgery. He does not have diabetes.  Physical Exam MUSCULOSKELETAL: Sensation intact, no fracture or dislocation, full function, good circulation, and strength intact in left index finger.  Swelling present limiting range of motion.  Flexor extensor function intact.  Results Radiology Left index finger x-ray (04/16/2024): No fracture, dislocation, or osseous injury (Independently interpreted)  Assessment and Plan Laceration of left index finger Laceration with swelling, no fracture, dislocation, or tendon injury. Good circulation and sensation. No infection. - Continue Augmentin  for 10 days. - Monitor swelling and function. - Return if issues arise.  Follow-Up Instructions: Return if symptoms worsen or fail to improve.   Orders:  No orders of the defined types were placed in this encounter.  No orders of the defined types were placed in this encounter.     Procedures: No procedures performed   Clinical Data: No additional findings.   Subjective: Chief Complaint  Patient presents with   Left Hand  - Pain    Index finger    HPI  Review of Systems  Constitutional: Negative.   HENT: Negative.    Eyes: Negative.   Respiratory: Negative.    Cardiovascular: Negative.   Gastrointestinal: Negative.   Endocrine: Negative.   Genitourinary: Negative.   Skin: Negative.   Allergic/Immunologic: Negative.   Neurological: Negative.   Hematological: Negative.   Psychiatric/Behavioral: Negative.    All other systems reviewed and are negative.    Objective: Vital Signs: There were no vitals taken for this visit.  Physical Exam Vitals and nursing note reviewed.  Constitutional:      Appearance: He is well-developed.  HENT:     Head: Normocephalic and atraumatic.  Eyes:     Pupils: Pupils are equal, round, and reactive to light.  Pulmonary:     Effort: Pulmonary effort is normal.  Abdominal:     Palpations: Abdomen is soft.  Musculoskeletal:        General: Normal range of motion.     Cervical back: Neck supple.  Skin:    General: Skin is warm.  Neurological:     Mental Status: He is alert and oriented to person, place, and time.  Psychiatric:        Behavior: Behavior normal.        Thought Content: Thought content normal.        Judgment: Judgment normal.     Ortho Exam  Specialty Comments:  No specialty comments available.  Imaging: No  results found.   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   Mixed dyslipidemia 01/11/2023   Dyspnea on exertion 01/11/2023   Laceration of left hand without foreign body 01/04/2021   Cellulitis of foot 01/04/2021   Hard of hearing    Coronary artery calcification    Encounter for monitoring flecainide  therapy    Atypical atrial flutter (HCC)    Pneumothorax, right 08/10/2020   Pneumothorax 08/10/2020   Nodule of middle lobe of right lung 07/29/2020   Sinus  infection    Salmonella    Ear infection    COPD (chronic obstructive pulmonary disease) (HCC)    Colitis    Cataract    Pain in left leg 09/20/2016   Wellness examination 02/24/2014   GERD (gastroesophageal reflux disease) 01/22/2014   COPD exacerbation (HCC) 01/22/2014   BPH (benign prostatic hyperplasia) 01/22/2014   Cough 01/22/2014   Past Medical History:  Diagnosis Date   Atypical atrial flutter (HCC)    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   Cataract    Cellulitis of foot 01/04/2021   Chronic anticoagulation    Colitis    COPD (chronic obstructive pulmonary disease) (HCC)    COPD exacerbation (HCC) 01/22/2014   Coronary artery calcification    Cough 01/22/2014   Dyspnea on exertion 01/11/2023   Ear infection    GERD (gastroesophageal reflux disease)    Hard of hearing    Hypertension 08/23/2023   Laceration of left hand without foreign body 01/04/2021   Lung cancer, middle lobe (HCC) 10/16/2023   Mixed dyslipidemia 01/11/2023   Nodule of middle lobe of right lung 07/29/2020   Added automatically from request for surgery [id number]     Pain in left foot 09/20/2016   Pain in left leg 09/20/2016   Pneumothorax 08/10/2020   Pneumothorax, right 08/10/2020   Salmonella    Sinus infection    Skin lesion 01/22/2014   Wellness examination 02/24/2014    Family History  Problem Relation Age of Onset   Hyperlipidemia Father    Stroke Father    Alcohol abuse Father    Throat cancer Brother    Lymphoma Brother    Heart attack Brother     Past Surgical History:  Procedure Laterality Date   BRONCHIAL BIOPSY  08/10/2020   Procedure: BRONCHIAL BIOPSIES;  Surgeon: Brenna Adine CROME, DO;  Location: [facility];  Service: Pulmonary;;   BRONCHIAL BRUSHINGS  08/10/2020   Procedure: BRONCHIAL BRUSHINGS;  Surgeon: Brenna Adine CROME, DO;  Location: [facility];  Service:  Pulmonary;;   BRONCHIAL NEEDLE ASPIRATION BIOPSY  08/10/2020   Procedure: BRONCHIAL NEEDLE ASPIRATION BIOPSIES;  Surgeon: Brenna Adine CROME, DO;  Location: [facility];  Service: Pulmonary;;   BRONCHIAL WASHINGS  08/10/2020   Procedure: BRONCHIAL WASHINGS;  Surgeon: Brenna Adine CROME, DO;  Location: [facility];  Service: Pulmonary;;   COLONOSCOPY     ENDOBRONCHIAL ULTRASOUND  08/10/2020   Procedure: ENDOBRONCHIAL ULTRASOUND;  Surgeon: Brenna Adine CROME, DO;  Location: [facility];  Service: Pulmonary;;   EYE SURGERY Right    FIDUCIAL MARKER PLACEMENT  08/10/2020   Procedure: FIDUCIAL MARKER PLACEMENT;  Surgeon: Brenna Adine CROME, DO;  Location: [facility];  Service: Pulmonary;;   IR RADIOLOGIST EVAL & MGMT  05/07/2019  IR RADIOLOGIST EVAL & MGMT  05/27/2019   IR RADIOLOGIST EVAL & MGMT  10/23/2019   IR RADIOLOGIST EVAL & MGMT  01/15/2020   VIDEO BRONCHOSCOPY WITH ENDOBRONCHIAL NAVIGATION Right 08/10/2020   Procedure: VIDEO BRONCHOSCOPY WITH ENDOBRONCHIAL NAVIGATION;  Surgeon: Brenna Adine CROME, DO;  Location: [facility];  Service: Pulmonary;  Laterality: Right;   Social History   Occupational History   Not on file  Tobacco Use   Smoking status: Former    Current packs/day: 1.00    Average packs/day: 1 pack/day for 55.0 years (55.0 ttl pk-yrs)    Types: Cigarettes   Smokeless tobacco: Never   Tobacco comments:    down to 0.5ppd  Vaping Use   Vaping status: Never Used  Substance and Sexual Activity   Alcohol use: Never   Drug use: No   Sexual activity: Not Currently    Birth control/protection: None

## 2024-04-18 NOTE — Progress Notes (Addendum)
 " Cardiology Office Note:    Date:  04/23/2024   ID:  Patrick Morris, DOB 02-27-1951, MRN [id number]  PCP:  Patrick Loving, PA-C  Cardiologist:  Patrick Leiter, MD    Referring MD: Patrick Loving, PA-C with dronedarone  therapy please do CMP every 6 months in particular watch for signs of hepatocellular injury which is rare but can occur  He is off his antiarrhythmic drug remains in atrial fibrillation rate is controlled he feels well and prefers rate control and anticoagulation versus resuming flecainide  and cardioversion Regardless I cannot cardiovert him at this time he has been intermittently stopping his anticoagulant with recurrent GU bleeding with weekly bladder chemotherapy. I think were best to stop his anticoagulant and now resume 1 week after his last treatment in February. He and his wife will play close attention to symptoms we can always come back and rethink the issues of either EP ablation or antiarrhythmic drug with cardioversion and I will see back in my office in 3 months. 05/08/2024   ASSESSMENT:    1. Atypical atrial flutter (HCC)   2. High risk medication use   3. Chronic anticoagulation   4. Coronary artery calcification seen on CAT scan   5. Mixed dyslipidemia   6. Chronic obstructive pulmonary disease, unspecified COPD type (HCC)    PLAN:    In order of problems listed above:  In all honesty I would not of stopped his flecainide  the finding of coronary calcification is well in excess of 50% not the equivalent of having CAD however I am not going to change his antiarrhythmic drugs again He is having atypical atrial flutter with 4-1 conduction drain at around is contraindicated and sustained atrial arrhythmias.  He will stop dronedarone  [place] to the office 2 weeks EKG and if he remains in atrial flutter will need to set him up for outpatient cardioversion and personally to restart his flecainide  at that time  he will continue his current anticoagulant without  bleeding complication Continue his statin with coronary calcification score is less than 300 does not require coincident antiplatelet therapy Stable COPD not having shortness of breath   Next appointment: 9 months   Medication Adjustments/Labs and Tests Ordered: Current medicines are reviewed at length with the patient today.  Concerns regarding medicines are outlined above.  Orders Placed This Encounter  Procedures   EKG 12-Lead   No orders of the defined types were placed in this encounter.    History of Present Illness:    Patrick Morris is a 73 y.o. male with a hx of atypical atrial flutter with chronic anticoagulation coronary artery calcification on CT without evidence of ischemia on a myocardial perfusion imaging non-small cell lung cancer treated with XRT emphysema cholelithiasis and aortic atherosclerosis last seen 01/16/2023.  This summer was seen by my partner who stopped his flecainide .  At age 30 he has a  coronary calcium  score of 181 and he was placed on Multaq .  Compliance with diet, lifestyle and medications: Yes  [name]  and if it persists she will contact me and I will stop his beta-blocker He has an Apple watch but is not set up for EKG will give him instructions. 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   Cataract    Cellulitis of foot 01/04/2021   Colitis    COPD (chronic obstructive pulmonary  disease) (HCC)    COPD exacerbation (HCC) 01/22/2014   Coronary artery calcification    Cough 01/22/2014   Dyspnea on exertion 01/11/2023   Ear infection    Encounter for monitoring flecainide  therapy    GERD (gastroesophageal reflux disease)    Hard of hearing    Hypertension 08/23/2023   Laceration of left hand without foreign body 01/04/2021   Lung cancer, middle lobe (HCC) 10/16/2023   Mixed dyslipidemia 01/11/2023   Nodule of middle lobe of right lung 07/29/2020   Added automatically from request for surgery [id number]     Pain in left foot 09/20/2016   Pain in left leg 09/20/2016   Pneumothorax 08/10/2020   Pneumothorax, right 08/10/2020   Salmonella    Sinus infection    Skin lesion 01/22/2014   Wellness examination 02/24/2014    Current Medications: Active Medications[1]    EKGs/Labs/Other Studies Reviewed:    The following studies were reviewed today:  Cardiac Studies & Procedures   ______________________________________________________________________________________________   STRESS TESTS  EXERCISE TOLERANCE TEST (ETT) 11/20/2023  Interpretation Summary   Rest ECG demonstrates type 1 second-degree AV block.   Exercise capacity was moderately impaired. Stage 1 was reached after exercising for 2 min and 59 sec. Maximum HR of 100 bpm. MPHR 68.0%. Peak METS 4.6.   Type 1 Second-degree AV block was not seen at peak stress, limited assessment due to baseline artifact. Observed again in recovery. The ECG was not diagnostic due to failure to achieve 85% MAPHR.   Frequent PACs noted in recovery.   Nondiagnostic test for ischemia.   With baseline conduction abnormalities and poor effort tolerance, unable to assess safety of continued flecainide  use.   Recommend evaluation with electrophysiologist for further long-term antiarrhythmic for atrial flutter rhythm control.  Discontinue flecainide .   ECHOCARDIOGRAM  ECHOCARDIOGRAM COMPLETE  02/06/2023  Narrative ECHOCARDIOGRAM REPORT    [name] Date of Exam: 02/06/2023 Medical Rec #:  [id number]     Height:       67.0 in Accession #:    [id number]    Weight:       201.2 lb Date of Birth:  Oct 15, 1950     BSA:          2.027 m Patient Age:    72 years      BP:           118/70 mmHg Patient Gender: M             HR:           72 bpm. Exam Location:  [place]  Procedure: 2D Echo, 3D Echo, Cardiac Doppler and Color Doppler  Indications:    Atypical atrial flutter (HCC) [I48.4 (ICD-10-CM)]; Dyspnea on exertion [R06.09 (ICD-10-CM)]  History:        Patient has no prior history of Echocardiogram examinations. COPD and GERD, Arrythmias:Atrial Flutter, Signs/Symptoms:Dyspnea; Risk Factors:Dyslipidemia, Former Smoker and Hypertension.  Sonographer:    Alan Greenhouse RDMS, RVT, RDCS Referring Phys: CYRUS JENNIFER SAUNDERS [facility]  IMPRESSIONS  1. Rhythm strip during this exam demonstrates atrial flutter. 2. Left ventricular ejection fraction, by estimation, is 50 to 55% with beat to beat variability. The left ventricle has low normal function. The left ventricle has no regional wall motion abnormalities. There is mild concentric left ventricular hypertrophy. Left ventricular diastolic parameters are indeterminate. 3. Right ventricular systolic function is normal. The right ventricular size is normal. There is normal pulmonary artery systolic pressure. 4. The mitral valve is grossly normal. Mild mitral valve regurgitation. No evidence of mitral stenosis. 5. The aortic valve is tricuspid. There is mild thickening of the aortic valve. Aortic valve regurgitation is not visualized. No aortic stenosis is present. 6. The inferior vena cava is normal in size with greater than 50% respiratory variability, suggesting right atrial pressure of 3 mmHg.  Comparison(s): No prior Echocardiogram.  FINDINGS Left Ventricle: Left ventricular ejection fraction, by estimation, is  50 to 55%. The left ventricle has low normal function. The left ventricle has no regional wall motion abnormalities. The left ventricular internal cavity size was normal in size. There is mild concentric left ventricular hypertrophy. Left ventricular diastolic parameters are indeterminate.  Right Ventricle: The right ventricular size is normal. Right vetricular wall thickness was not well visualized. Right ventricular systolic function is normal. There is normal pulmonary artery systolic pressure. The tricuspid regurgitant velocity is 2.29 m/s, and with an assumed right atrial pressure of 3 mmHg, the estimated right ventricular systolic pressure is 24.0 mmHg.  Left Atrium: Left atrial size was normal in size.  Right Atrium: Right atrial size was normal in size.  Pericardium: There is no evidence of pericardial effusion.  Mitral Valve: The mitral valve is grossly normal. Mild mitral valve regurgitation. No evidence of mitral valve stenosis.  Tricuspid Valve: The tricuspid valve is not well visualized. Tricuspid valve regurgitation is mild.  Aortic Valve: The aortic valve is tricuspid. There is mild thickening of the aortic valve. Aortic valve regurgitation is not visualized. No aortic stenosis is present. Aortic valve mean gradient measures 5.5 mmHg. Aortic valve peak gradient measures 9.6 mmHg. Aortic valve area, by VTI measures 2.34 cm.  Pulmonic Valve: The pulmonic valve was not well visualized. Pulmonic valve regurgitation is trivial.  Aorta: The aortic root and ascending aorta are structurally normal, with no evidence of dilitation.  Venous: The inferior vena cava is normal in size with greater than 50% respiratory variability, suggesting right atrial pressure of 3 mmHg.  IAS/Shunts: The interatrial septum was not well visualized.  EKG: Rhythm strip during this exam demonstrates atrial flutter.   LEFT VENTRICLE PLAX 2D LVIDd:         4.50 cm      Diastology LVIDs:         3.10  cm      LV e' medial:    7.72 cm/s LV PW:         1.10 cm      LV E/e' medial:  16.1 LV IVS:        1.10 cm      LV e' lateral:   11.50 cm/s LVOT diam:     2.10 cm      LV E/e' lateral: 10.8 LV SV:         83 LV SV Index:   41 LVOT Area:     3.46 cm  3D Volume EF: LV Volumes (MOD)            3D EF:        45 %  LV vol d, MOD A2C: 136.5 ml LV EDV:       149 ml LV vol d, MOD A4C: 114.5 ml LV ESV:       82 ml LV vol s, MOD A2C: 71.0 ml  LV SV:        66 ml LV vol s, MOD A4C: 61.3 ml LV SV MOD A2C:     65.6 ml LV SV MOD A4C:     114.5 ml LV SV MOD BP:      61.2 ml  RIGHT VENTRICLE RV S prime:     11.09 cm/s TAPSE (M-mode): 1.5 cm  LEFT ATRIUM           Index        RIGHT ATRIUM           Index LA diam:      4.30 cm 2.12 cm/m   RA Area:     17.10 cm LA Vol (A2C): 48.7 ml 24.02 ml/m  RA Volume:   43.00 ml  21.21 ml/m LA Vol (A4C): 54.4 ml 26.83 ml/m AORTIC VALVE AV Area (Vmax):    2.35 cm AV Area (Vmean):   2.26 cm AV Area (VTI):     2.34 cm AV Vmax:           154.80 cm/s AV Vmean:          109.500 cm/s AV VTI:            0.354 m AV Peak Grad:      9.6 mmHg AV Mean Grad:      5.5 mmHg LVOT Vmax:         104.90 cm/s LVOT Vmean:        71.500 cm/s LVOT VTI:          0.239 m LVOT/AV VTI ratio: 0.68  AORTA Ao Root diam: 3.60 cm Ao Asc diam:  3.20 cm  MITRAL VALVE                TRICUSPID VALVE MV Area (PHT): 5.20 cm     TR Peak grad:   21.0 mmHg MV Decel Time: 146 msec     TR Vmax:        229.00 cm/s MR Peak grad: 89.3 mmHg MR Vmax:      472.50 cm/s   SHUNTS MV E velocity: 124.25 cm/s  Systemic VTI:  0.24 m MV A velocity: 90.25 cm/s   Systemic Diam: 2.10 cm MV E/A ratio:  1.38  Sreedhar reddy Madireddy Electronically signed by Alean reddy Madireddy Signature Date/Time: 02/06/2023/10:41:55 AM    Final    MONITORS  LONG TERM MONITOR-LIVE TELEMETRY (3-14 DAYS) 01/10/2023  Narrative Patch Wear Time:  10 days and 18 hours (2024-07-10T10:02:01-399 to  2024-09-03T11:15:39-0400)  Monitor 1 Patient had a min HR of 51 bpm, max HR of 167 bpm, and avg HR of 75 bpm. Predominant underlying rhythm was Sinus Rhythm. First Degree AV Block was present. 50 Supraventricular Tachycardia runs occurred, the run with the fastest interval lasting 15 beats with a max rate of 167 bpm, the longest lasting 2 hours 45 mins with an avg rate of 94 bpm. Some episodes of Supraventricular Tachycardia may be possible Atrial Tachycardia with variable block. Supraventricular Tachycardia was detected within +/- 45 seconds of symptomatic patient event(s). Isolated SVEs were rare (<1.0%), SVE Couplets were rare (<1.0%), and SVE Triplets were rare (<1.0%). Isolated VEs were rare (<1.0%), and no VE Couplets or VE Triplets were present. Inverted QRS complexes possibly  due to inverted placement of device.  Monitor 2 Atrial Flutter occurred continuously (100% burden), ranging from 43-139 bpm (avg of 83 bpm). Isolated VEs were rare (<1.0%), VE Couplets were rare (<1.0%), and no VE Triplets were present. Previously notified: MD notification criteria for First Documentation of Atrial Flutter met - Notified DeDe H RN on 26 Dec 2022 at 2:17 PM ET (KT).       ______________________________________________________________________________________________          Recent Labs: 12/25/2023: ALT 15; BUN 9; Creatinine 0.74; Hemoglobin 13.7; Platelet Count 250; Potassium 3.9; Sodium 140  Recent Lipid Panel    Component Value Date/Time   CHOL 101 10/15/2023 1034   CHOL 109 01/02/2023 1115   TRIG 61 10/15/2023 1034   HDL 30 (L) 10/15/2023 1034   HDL 27 (L) 01/02/2023 1115   CHOLHDL 3.4 10/15/2023 1034   VLDL 16.2 09/11/2017 0843   LDLCALC 57 10/15/2023 1034    Physical Exam:    VS:  BP 112/66   Pulse 66   Ht 5' 7 (1.702 m)   Wt 189 lb 2 oz (85.8 kg)   SpO2 96%   BMI 29.62 kg/m     Wt Readings from Last 3 Encounters:  04/23/24 189 lb 2 oz (85.8 kg)  04/11/24 191 lb (86.6  kg)  03/31/24 190 lb 12.8 oz (86.5 kg)     GEN:  Well nourished, well developed in no acute distress HEENT: Normal NECK: No JVD; No carotid bruits LYMPHATICS: No lymphadenopathy CARDIAC: RRR, no murmurs, rubs, gallops RESPIRATORY:  Clear to auscultation without rales, wheezing or rhonchi  ABDOMEN: Soft, non-tender, non-distended MUSCULOSKELETAL:  No edema; No deformity  SKIN: Warm and dry NEUROLOGIC:  Alert and oriented x 3 PSYCHIATRIC:  Normal affect    Signed, Patrick Leiter, MD  04/23/2024 8:56 AM    [facility]      [1]  Current Meds  Medication Sig   acetaminophen  (TYLENOL ) 500 MG tablet Take 1,000 mg by mouth every 6 (six) hours as needed for mild pain or moderate pain. For pain   albuterol  (VENTOLIN  HFA) 108 (90 Base) MCG/ACT inhaler Inhale 2 puffs into the lungs every 6 (six) hours as needed for wheezing or shortness of breath.   alendronate  (FOSAMAX ) 35 MG tablet Take 1 tablet (35 mg total) by mouth every 7 (seven) days. Take with a full glass of water on an empty stomach.   amoxicillin -clavulanate (AUGMENTIN ) 875-125 MG tablet Take 1 tablet by mouth 2 (two) times daily.   apixaban  (ELIQUIS ) 5 MG TABS tablet Take 1 tablet by mouth twice daily   clonazePAM  (KLONOPIN ) 0.5 MG tablet Take 1 tablet by mouth twice daily as needed for anxiety   dronedarone  (MULTAQ ) 400 MG tablet Take 1 tablet (400 mg total) by mouth 2 (two) times daily with a meal.   fluticasone  (FLONASE ) 50 MCG/ACT nasal spray Place 2 sprays into both nostrils daily.   gabapentin  (NEURONTIN ) 100 MG capsule Take 1 capsule (100 mg total) by mouth 3 (three) times daily.   metoprolol  tartrate (LOPRESSOR ) 25 MG tablet Take 1 tablet (25 mg total) by mouth 2 (two) times daily.   nitroGLYCERIN  (NITROSTAT ) 0.4 MG SL tablet Place 0.4 mg under the tongue every 5 (five) minutes as needed for chest pain.   oxyCODONE  (OXY IR/ROXICODONE ) 5 MG immediate release tablet Take 1 tablet (5 mg total) by mouth  every 4 (four) hours as needed for severe pain (pain score 7-10).   rosuvastatin  (CRESTOR ) 10  MG tablet Take 1 tablet (10 mg total) by mouth daily.   sulfacetamide (BLEPH-10) 10 % ophthalmic solution Place 2 drops into both eyes every 4 (four) hours.   TRELEGY ELLIPTA 100-62.5-25 MCG/ACT AEPB Inhale 1 puff into the lungs daily.   "

## 2024-04-23 ENCOUNTER — Ambulatory Visit: Attending: Cardiology | Admitting: Cardiology

## 2024-04-23 ENCOUNTER — Encounter: Payer: Self-pay | Admitting: Cardiology

## 2024-04-23 VITALS — BP 112/66 | HR 66 | Ht 67.0 in | Wt 189.1 lb

## 2024-04-23 DIAGNOSIS — I484 Atypical atrial flutter: Secondary | ICD-10-CM

## 2024-04-23 DIAGNOSIS — E782 Mixed hyperlipidemia: Secondary | ICD-10-CM

## 2024-04-23 DIAGNOSIS — I2510 Atherosclerotic heart disease of native coronary artery without angina pectoris: Secondary | ICD-10-CM

## 2024-04-23 DIAGNOSIS — J449 Chronic obstructive pulmonary disease, unspecified: Secondary | ICD-10-CM

## 2024-04-23 DIAGNOSIS — Z79899 Other long term (current) drug therapy: Secondary | ICD-10-CM

## 2024-04-23 DIAGNOSIS — Z7901 Long term (current) use of anticoagulants: Secondary | ICD-10-CM

## 2024-04-23 MED ORDER — METOPROLOL TARTRATE 25 MG PO TABS: 25.0000 mg | ORAL_TABLET | Freq: Every day | ORAL | 3 refills | Status: AC

## 2024-04-23 NOTE — Patient Instructions (Addendum)
 Medication Instructions:  Your physician has recommended you make the following change in your medication:   START: Metoprolol  tartrate 25 mg daily STOP: Multaq   *If you need a refill on your cardiac medications before your next appointment, please call your pharmacy*  Lab Work: None If you have labs (blood work) drawn today and your tests are completely normal, you will receive your results only by: MyChart Message (if you have MyChart) OR A paper copy in the mail If you have any lab test that is abnormal or we need to change your treatment, we will call you to review the results.  Testing/Procedures: None  Follow-Up: At [facility], you and your health needs are our priority.  As part of our continuing mission to provide you with exceptional heart care, our providers are all part of one team.  This team includes your primary Cardiologist (physician) and Advanced Practice Providers or APPs (Physician Assistants and Nurse Practitioners) who all work together to provide you with the care you need, when you need it.  Your next appointment:   9 month(s)  Provider:   Redell Leiter, MD    We recommend signing up for the patient portal called MyChart.  Sign up information is provided on this After Visit Summary.  MyChart is used to connect with patients for Virtual Visits (Telemedicine).  Patients are able to view lab/test results, encounter notes, upcoming appointments, etc.  Non-urgent messages can be sent to your provider as well.   To learn more about what you can do with MyChart, go to [email or web address].   Other Instructions Nurse visit in 2 weeks for EKG.

## 2024-04-25 ENCOUNTER — Ambulatory Visit: Admitting: Hematology & Oncology

## 2024-04-25 ENCOUNTER — Inpatient Hospital Stay

## 2024-04-26 ENCOUNTER — Other Ambulatory Visit: Payer: Self-pay | Admitting: Medical

## 2024-04-28 NOTE — Telephone Encounter (Signed)
 Requesting: clonazepam  0.5mg   Contract: 02/11/24 UDS: 02/11/24 Last Visit: 04/11/24 Next Visit: None Last Refill: 03/25/24 #60 and 0RF  Please Advise

## 2024-05-02 ENCOUNTER — Inpatient Hospital Stay: Attending: Hematology & Oncology

## 2024-05-02 ENCOUNTER — Inpatient Hospital Stay (HOSPITAL_BASED_OUTPATIENT_CLINIC_OR_DEPARTMENT_OTHER): Admitting: Hematology & Oncology

## 2024-05-02 VITALS — BP 133/68 | HR 53 | Temp 97.6°F | Wt 191.0 lb

## 2024-05-02 DIAGNOSIS — N401 Enlarged prostate with lower urinary tract symptoms: Secondary | ICD-10-CM

## 2024-05-02 DIAGNOSIS — C342 Malignant neoplasm of middle lobe, bronchus or lung: Secondary | ICD-10-CM | POA: Diagnosis not present

## 2024-05-02 DIAGNOSIS — Z85118 Personal history of other malignant neoplasm of bronchus and lung: Secondary | ICD-10-CM | POA: Insufficient documentation

## 2024-05-02 DIAGNOSIS — Z923 Personal history of irradiation: Secondary | ICD-10-CM | POA: Insufficient documentation

## 2024-05-02 DIAGNOSIS — N3289 Other specified disorders of bladder: Secondary | ICD-10-CM | POA: Diagnosis not present

## 2024-05-02 DIAGNOSIS — Z87891 Personal history of nicotine dependence: Secondary | ICD-10-CM | POA: Insufficient documentation

## 2024-05-02 DIAGNOSIS — C679 Malignant neoplasm of bladder, unspecified: Secondary | ICD-10-CM | POA: Insufficient documentation

## 2024-05-02 DIAGNOSIS — N2889 Other specified disorders of kidney and ureter: Secondary | ICD-10-CM

## 2024-05-02 LAB — CBC WITH DIFFERENTIAL (CANCER CENTER ONLY)
Abs Immature Granulocytes: 0.02 K/uL (ref 0.00–0.07)
Basophils Absolute: 0.1 K/uL (ref 0.0–0.1)
Basophils Relative: 1 %
Eosinophils Absolute: 0.3 K/uL (ref 0.0–0.5)
Eosinophils Relative: 4 %
HCT: 40.6 % (ref 39.0–52.0)
Hemoglobin: 13.8 g/dL (ref 13.0–17.0)
Immature Granulocytes: 0 %
Lymphocytes Relative: 23 %
Lymphs Abs: 1.7 K/uL (ref 0.7–4.0)
MCH: 29.9 pg (ref 26.0–34.0)
MCHC: 34 g/dL (ref 30.0–36.0)
MCV: 88.1 fL (ref 80.0–100.0)
Monocytes Absolute: 0.5 K/uL (ref 0.1–1.0)
Monocytes Relative: 7 %
Neutro Abs: 4.7 K/uL (ref 1.7–7.7)
Neutrophils Relative %: 65 %
Platelet Count: 242 K/uL (ref 150–400)
RBC: 4.61 MIL/uL (ref 4.22–5.81)
RDW: 13.2 % (ref 11.5–15.5)
WBC Count: 7.3 K/uL (ref 4.0–10.5)
nRBC: 0 % (ref 0.0–0.2)

## 2024-05-02 LAB — CMP (CANCER CENTER ONLY)
ALT: 12 U/L (ref 0–44)
AST: 17 U/L (ref 15–41)
Albumin: 4 g/dL (ref 3.5–5.0)
Alkaline Phosphatase: 95 U/L (ref 38–126)
Anion gap: 10 (ref 5–15)
BUN: 11 mg/dL (ref 8–23)
CO2: 24 mmol/L (ref 22–32)
Calcium: 9 mg/dL (ref 8.9–10.3)
Chloride: 107 mmol/L (ref 98–111)
Creatinine: 0.75 mg/dL (ref 0.61–1.24)
GFR, Estimated: >60 mL/min
Glucose, Bld: 106 mg/dL — ABNORMAL HIGH (ref 70–99)
Potassium: 3.9 mmol/L (ref 3.5–5.1)
Sodium: 141 mmol/L (ref 135–145)
Total Bilirubin: 0.5 mg/dL (ref 0.0–1.2)
Total Protein: 7.1 g/dL (ref 6.5–8.1)

## 2024-05-02 LAB — LACTATE DEHYDROGENASE: LDH: 151 U/L (ref 105–235)

## 2024-05-02 NOTE — Progress Notes (Signed)
 " Hematology and Oncology Follow Up Visit  Patrick Morris [id number] 16-Dec-1950 74 y.o. 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  Apparently, he is undergoing intravesicular BCG therapy for superficial bladder cancer.  He is getting this done at [facility].  We did do a CT scan of his chest/abdomen/pelvis.  This was done on 04/01/2024.  This did show the exophytic lesion in the upper pole of the right kidney.  It measures 2.9 x 2.3 cm.  I suspect this probably is renal cell carcinoma.  He does have unchanged sub-4 mm pulmonary nodules.  Nothing else looks suspicious for metastatic disease.  He does have a history of non-small cell lung cancer which was very early stage.  Again all this is connected with his past smoking use.  We will have to see about the possibility of a MRI.  Hopefully, this will give us  a better idea as to what is going on with the right kidney.  He has not noted any hematuria.  He does feel little bit weak after having the intravesicular BCG.  He has had no nausea or vomiting.  Has had no cough.  Has had no leg swelling.  He has had no rashes.  He has had no fever.  Overall, I would have to say that his performance status is probably ECOG 2.     Medications:  Current Outpatient Medications:    acetaminophen  (TYLENOL ) 500 MG tablet, Take 1,000 mg by mouth every 6 (six) hours as needed for mild pain or moderate pain. For pain, Disp: , Rfl:    albuterol  (VENTOLIN  HFA) 108 (90 Base) MCG/ACT inhaler, Inhale 2 puffs into the lungs every 6 (six) hours as needed for wheezing or shortness of breath., Disp: , Rfl:    alendronate  (FOSAMAX ) 35 MG tablet, Take 1 tablet (35 mg total) by mouth every 7 (seven) days. Take with a full glass of water on an  empty stomach., Disp: 4 tablet, Rfl: 11   amoxicillin -clavulanate (AUGMENTIN ) 875-125 MG tablet, Take 1 tablet by mouth 2 (two) times daily. (Patient not taking: Reported on 05/02/2024), Disp: 20 tablet, Rfl: 0   apixaban  (ELIQUIS ) 5 MG TABS tablet, Take 1 tablet by mouth twice daily, Disp: 180 tablet, Rfl: 2   clonazePAM  (KLONOPIN ) 0.5 MG tablet, Take 1 tablet by mouth twice daily as needed for anxiety, Disp: 60 tablet, Rfl: 0   fluticasone  (FLONASE ) 50 MCG/ACT nasal spray, Place 2 sprays into both nostrils daily., Disp: 16 g, Rfl: 1   gabapentin  (NEURONTIN ) 100 MG capsule, Take 1 capsule (100 mg total) by mouth 3 (three) times daily., Disp: 270 capsule, Rfl: 0   metoprolol  tartrate (LOPRESSOR ) 25 MG tablet, Take 1 tablet (25 mg total) by mouth daily. Please take this medication in the morning, Disp: 90 tablet, Rfl: 3   nitroGLYCERIN  (NITROSTAT ) 0.4 MG SL tablet, Place 0.4 mg under the tongue every 5 (five) minutes as needed for chest pain., Disp: , Rfl:    oxyCODONE  (OXY IR/ROXICODONE ) 5 MG immediate release tablet, Take 1 tablet (5 mg total) by mouth every 4 (four) hours as needed for severe pain (pain score 7-10). (Patient not taking: Reported on 05/02/2024), Disp: 15 tablet, Rfl: 0   rosuvastatin  (CRESTOR ) 10 MG tablet, Take 1 tablet (10  mg total) by mouth daily., Disp: 90 tablet, Rfl: 2   sulfacetamide (BLEPH-10) 10 % ophthalmic solution, Place 2 drops into both eyes every 4 (four) hours., Disp: , Rfl:    TRELEGY ELLIPTA 100-62.5-25 MCG/ACT AEPB, Inhale 1 puff into the lungs daily., Disp: , Rfl:   Allergies:  Allergies  Allergen Reactions   Aspirin Other (See Comments)    Acid reflux and drooling    Past Medical History, Surgical history, Social history, and Family History were reviewed and updated.  Review of Systems: Review of Systems  Constitutional: Negative.   HENT:  Negative.    Eyes: Negative.   Respiratory: Negative.    Cardiovascular: Negative.   Gastrointestinal: Negative.    Endocrine: Negative.   Genitourinary: Negative.    Musculoskeletal: Negative.   Skin: Negative.   Neurological: Negative.   Hematological: Negative.   Psychiatric/Behavioral: Negative.      Physical Exam:  Vital signs show temperature of 97.6.  Pulse 53.  Blood pressure 133/68.  Weight is 191 pounds.     Wt Readings from Last 3 Encounters:  05/02/24 191 lb (86.6 kg)  04/23/24 189 lb 2 oz (85.8 kg)  04/11/24 191 lb (86.6 kg)    Physical Exam Vitals reviewed.  HENT:     Head: Normocephalic and atraumatic.  Eyes:     Pupils: Pupils are equal, round, and reactive to light.  Cardiovascular:     Rate and Rhythm: Normal rate and regular rhythm.     Heart sounds: Normal heart sounds.  Pulmonary:     Effort: Pulmonary effort is normal.     Breath sounds: Normal breath sounds.  Abdominal:     General: Bowel sounds are normal.     Palpations: Abdomen is soft.  Musculoskeletal:        General: No tenderness or deformity. Normal range of motion.     Cervical back: Normal range of motion.  Lymphadenopathy:     Cervical: No cervical adenopathy.  Skin:    General: Skin is warm and dry.     Findings: No erythema or rash.  Neurological:     Mental Status: He is alert and oriented to person, place, and time.  Psychiatric:        Behavior: Behavior normal.        Thought Content: Thought content normal.        Judgment: Judgment normal.      Lab Results  Component Value Date   WBC 7.3 05/02/2024   HGB 13.8 05/02/2024   HCT 40.6 05/02/2024   MCV 88.1 05/02/2024   PLT 242 05/02/2024     Chemistry      Component Value Date/Time   NA 140 12/25/2023 1204   NA 146 (H) 01/16/2023 1024   K 3.9 12/25/2023 1204   CL 105 12/25/2023 1204   CO2 25 12/25/2023 1204   BUN 9 12/25/2023 1204   BUN 12 01/16/2023 1024   CREATININE 0.74 12/25/2023 1204   CREATININE 0.68 (L) 10/15/2023 1034      Component Value Date/Time   CALCIUM  9.0 12/25/2023 1204   ALKPHOS 84 12/25/2023 1204    AST 17 12/25/2023 1204   ALT 15 12/25/2023 1204   BILITOT 0.6 12/25/2023 1204       Impression and Plan: Patrick Morris is a very nice 74 year old white male.  He has a history of a bronchogenic carcinoma of the right lower lung.  He underwent radiosurgery for this.  He also has a superficial bladder cancer.  He currently is getting intravesicular BCG for this.  Again, he has his renal mass in the right kidney.  It is not that big.  We will see about an MRI.  Then, we will see if this can be treated with RFA or maybe cryotherapy.  I know a lot is going on with Patrick Morris.  He has good support from his wife.  I would like to plan to see him back in another 6 .   Maude JONELLE Crease, MD 1/2/202612:49 PM "

## 2024-05-07 ENCOUNTER — Ambulatory Visit: Attending: Cardiology | Admitting: Cardiology

## 2024-05-07 VITALS — BP 124/68 | HR 99 | Ht 67.0 in | Wt 191.4 lb

## 2024-05-07 DIAGNOSIS — E782 Mixed hyperlipidemia: Secondary | ICD-10-CM | POA: Diagnosis not present

## 2024-05-07 NOTE — Patient Instructions (Addendum)
" ° °  Nurse Visit   Date of Encounter: 05/07/2024 ID: Patrick Morris, DOB Aug 12, 1950, MRN [id number]  PCP:  Dorina Dallas RIGGERS   [facility] Providers Cardiologist:  Redell Leiter, MD Electrophysiologist:  Soyla Gladis Norton, MD      Visit Details   VS:  BP 124/68   Pulse 99   Ht 5' 7 (1.702 m)   Wt 191 lb 6 oz (86.8 kg)   SpO2 96%   BMI 29.97 kg/m  , BMI Body mass index is 29.97 kg/m.  Wt Readings from Last 3 Encounters:  05/07/24 191 lb 6 oz (86.8 kg)  05/02/24 191 lb (86.6 kg)  04/23/24 189 lb 2 oz (85.8 kg)     Reason for visit: Repeat EKG  Performed today: Vitals, EKG, Provider consulted:Dr. Leiter reviewed EKG and last office visit notes , and Education Changes (medications, testing, etc.) : Stop Eliquis , restart 1 week after last treatment of chemotherapy  Length of Visit: 35 minutes Follow up visit with Dr. Leiter in 3 months      Medications Adjustments/Labs and Tests Ordered: Orders Placed This Encounter  Procedures   EKG 12-Lead   No orders of the defined types were placed in this encounter.    Signed, Alan LITTIE Leff, RN  05/07/2024 9:10 AM  "

## 2024-05-26 ENCOUNTER — Ambulatory Visit: Admitting: Cardiology

## 2024-06-02 ENCOUNTER — Other Ambulatory Visit

## 2024-06-02 ENCOUNTER — Telehealth: Payer: Self-pay | Admitting: Cardiology

## 2024-06-02 NOTE — Telephone Encounter (Signed)
 Pt spouse called in stating pt was supposed to have a EKG visit in 2 weeks.. please advise if this is correct. Pt has a 3 mon f/u in March with Dr. Monetta.

## 2024-06-04 NOTE — Telephone Encounter (Signed)
 Left message for Patrick Morris to call back

## 2024-06-05 NOTE — Telephone Encounter (Signed)
 Called and spoke with Patrick Morris the patient's wife per [facility] and clarified that the patient did not need a second nurse visit for an EKG. Dr. Monetta will see the patient at his follow up appointment in 3 months from his 05/07/24 nurse visit. Patrick Morris and the patient had no further questions at this time.

## 2024-06-16 ENCOUNTER — Inpatient Hospital Stay: Attending: Hematology & Oncology

## 2024-06-16 ENCOUNTER — Inpatient Hospital Stay: Admitting: Hematology & Oncology

## 2024-06-30 ENCOUNTER — Ambulatory Visit: Admitting: Cardiology

## 2024-07-25 ENCOUNTER — Ambulatory Visit: Admitting: Cardiology

## 2024-12-10 ENCOUNTER — Ambulatory Visit
